# Patient Record
Sex: Male | Born: 1935 | Race: White | Hispanic: No | Marital: Married | Smoking: Never smoker
Health system: Southern US, Community
[De-identification: ages and names within clinical notes are randomized; demographics above are authoritative.]

## PROBLEM LIST (undated history)

## (undated) DIAGNOSIS — K7460 Unspecified cirrhosis of liver: Secondary | ICD-10-CM

## (undated) DIAGNOSIS — I509 Heart failure, unspecified: Secondary | ICD-10-CM

## (undated) DIAGNOSIS — C801 Malignant (primary) neoplasm, unspecified: Secondary | ICD-10-CM

## (undated) DIAGNOSIS — G912 (Idiopathic) normal pressure hydrocephalus: Secondary | ICD-10-CM

## (undated) DIAGNOSIS — I10 Essential (primary) hypertension: Secondary | ICD-10-CM

## (undated) DIAGNOSIS — G4730 Sleep apnea, unspecified: Secondary | ICD-10-CM

## (undated) HISTORY — PX: JOINT REPLACEMENT: SHX530

## (undated) HISTORY — PX: HERNIA REPAIR: SHX51

## (undated) HISTORY — DX: Heart failure, unspecified: I50.9

---

## 2004-01-14 HISTORY — PX: PACEMAKER INSERTION: SHX728

## 2004-01-29 ENCOUNTER — Inpatient Hospital Stay: Payer: Self-pay | Admitting: Unknown Physician Specialty

## 2004-02-27 ENCOUNTER — Encounter: Payer: Self-pay | Admitting: Unknown Physician Specialty

## 2004-03-13 ENCOUNTER — Encounter: Payer: Self-pay | Admitting: Unknown Physician Specialty

## 2005-08-08 ENCOUNTER — Ambulatory Visit: Payer: Self-pay | Admitting: Gastroenterology

## 2005-10-08 ENCOUNTER — Ambulatory Visit: Payer: Self-pay

## 2007-01-22 ENCOUNTER — Ambulatory Visit: Payer: Self-pay | Admitting: Internal Medicine

## 2007-05-26 ENCOUNTER — Inpatient Hospital Stay: Payer: Self-pay | Admitting: Internal Medicine

## 2007-05-26 ENCOUNTER — Other Ambulatory Visit: Payer: Self-pay

## 2008-07-13 ENCOUNTER — Encounter: Payer: Self-pay | Admitting: Internal Medicine

## 2008-08-13 ENCOUNTER — Encounter: Payer: Self-pay | Admitting: Internal Medicine

## 2010-04-01 ENCOUNTER — Ambulatory Visit: Payer: Self-pay | Admitting: Internal Medicine

## 2010-04-16 ENCOUNTER — Inpatient Hospital Stay: Payer: Self-pay | Admitting: Internal Medicine

## 2010-04-24 ENCOUNTER — Encounter: Payer: Self-pay | Admitting: Orthopedic Surgery

## 2010-05-14 ENCOUNTER — Encounter: Payer: Self-pay | Admitting: Orthopedic Surgery

## 2010-06-14 ENCOUNTER — Encounter: Payer: Self-pay | Admitting: Orthopedic Surgery

## 2010-12-12 ENCOUNTER — Ambulatory Visit: Payer: Self-pay | Admitting: Internal Medicine

## 2012-11-29 IMAGING — US US EXTREM LOW VENOUS*R*
1 series · 14 of 24 positions shown · non-contrast
Comparison: none

REASON FOR EXAM: STAT CR [id number] swelling and pain of right leg TKR last
week eval for DVT
COMMENTS:

PROCEDURE:     US  - US DOPPLER LOW EXTR RIGHT  - April 01, 2010  [DATE]
RESULT:     The right femoral and popliteal veins are normally compressible.
The waveform patterns are normal and the color flow images are normal. The
response to the augmentation and Valsalva maneuvers is normal.

[Series 1: us extrem low venous*right* · 14 of 25 slices shown]
[im 1/25]
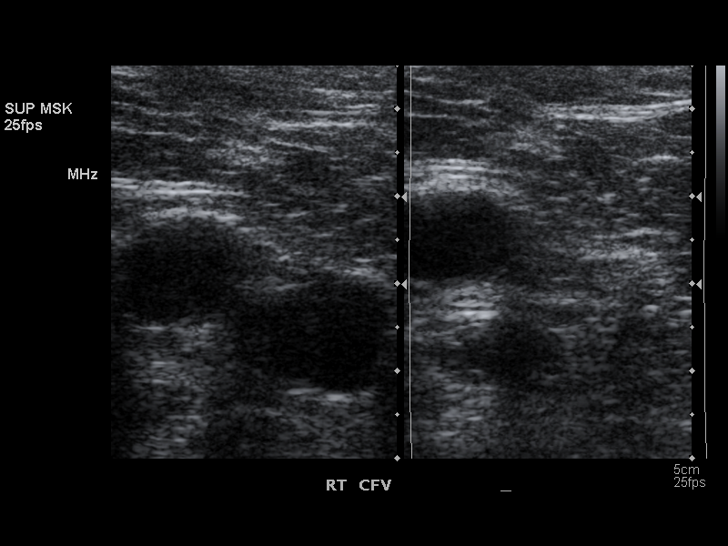
[im 3/25]
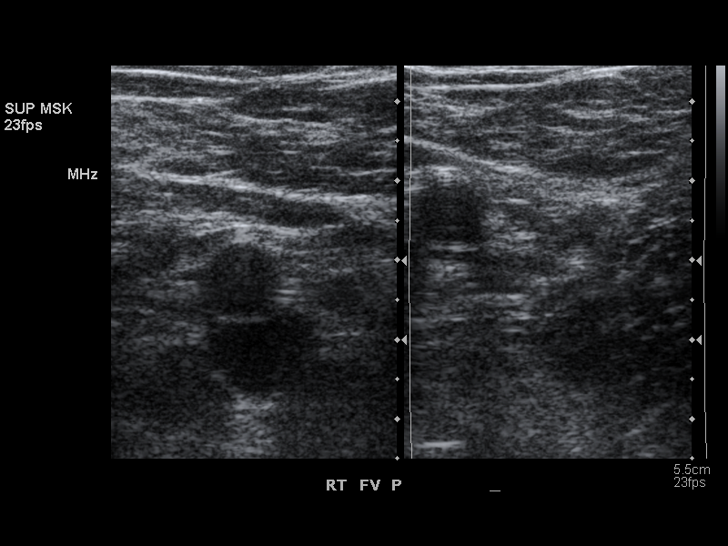
[im 5/25]
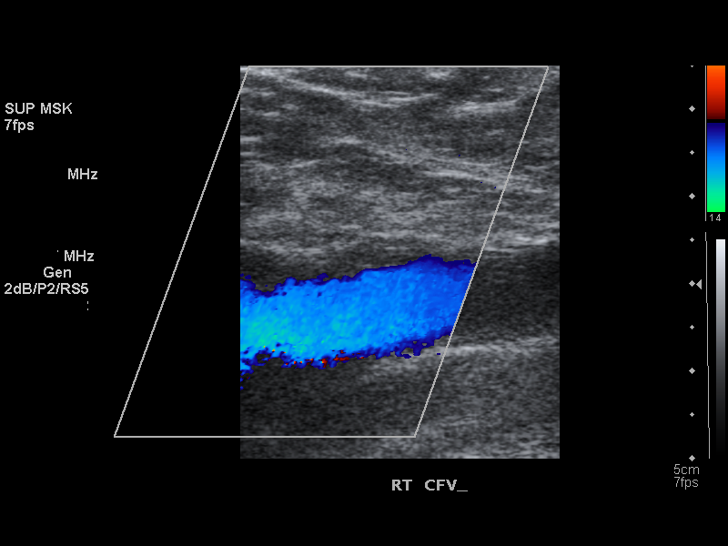
[im 7/25]
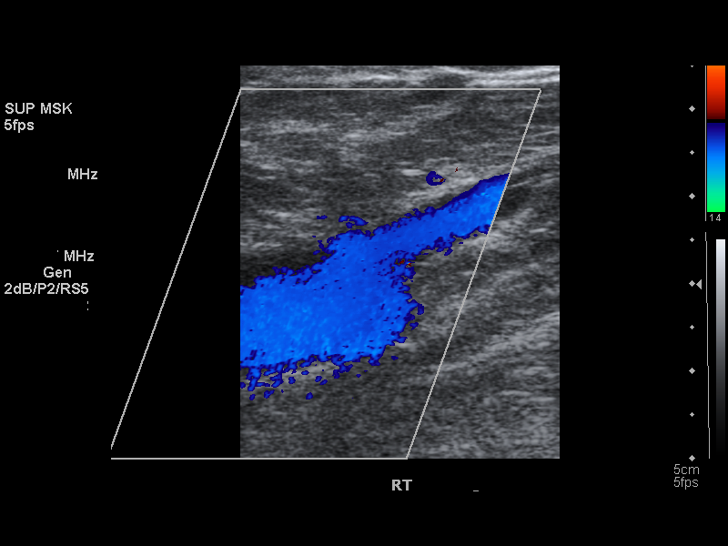
[im 8/25]
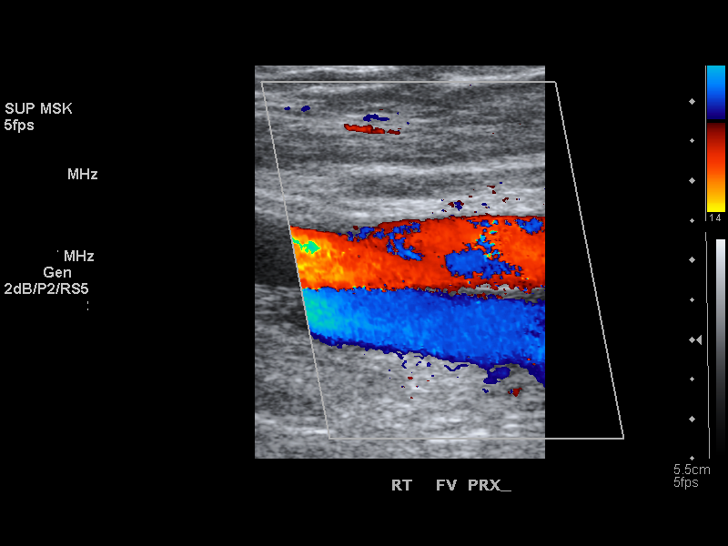
[im 10/25]
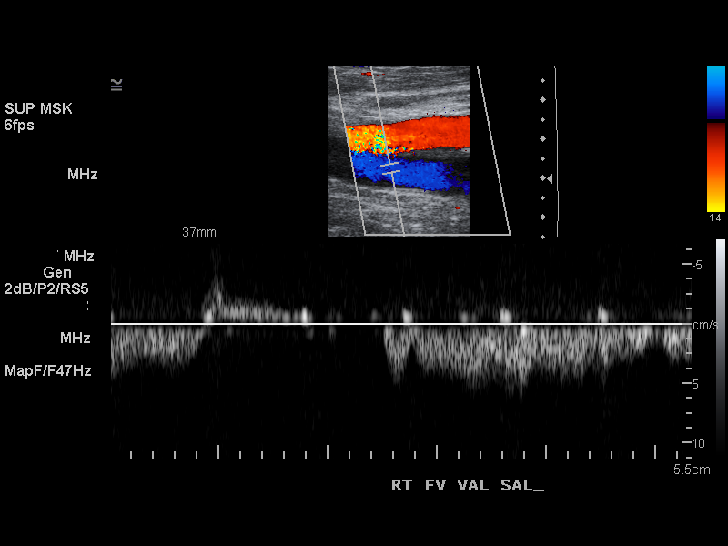
[im 12/25]
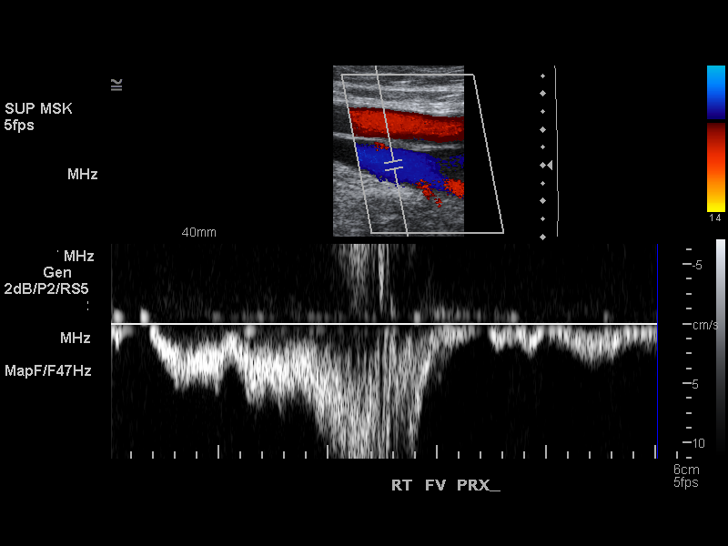
[im 13/25]
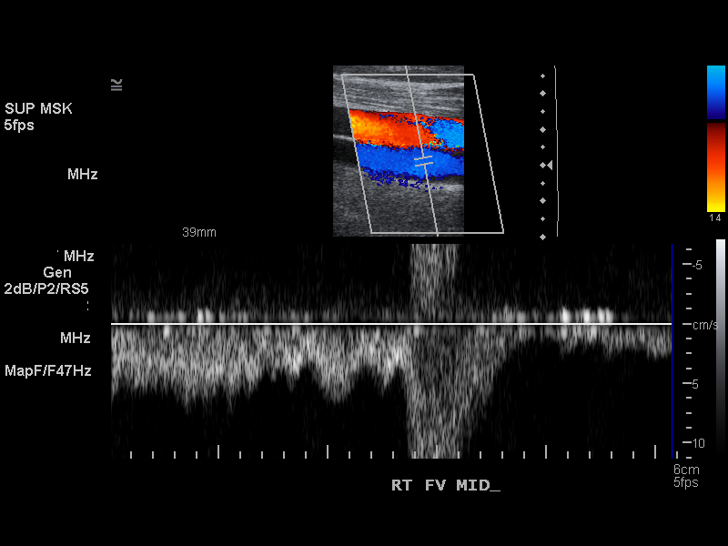
[im 15/25]
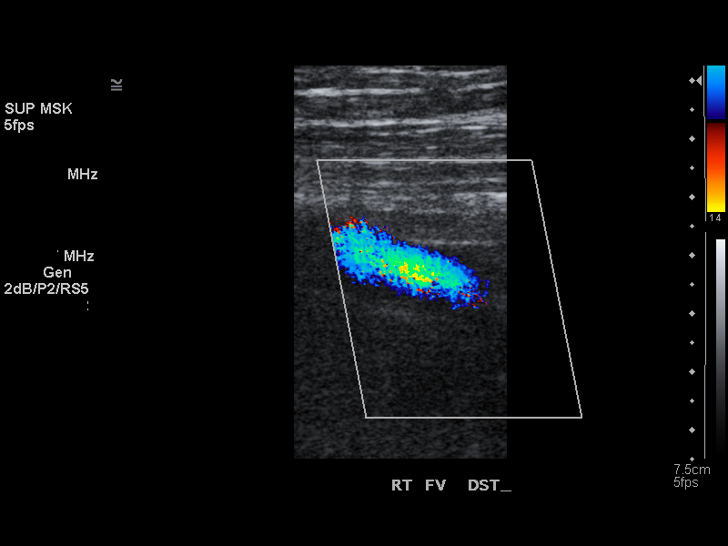
[im 17/25]
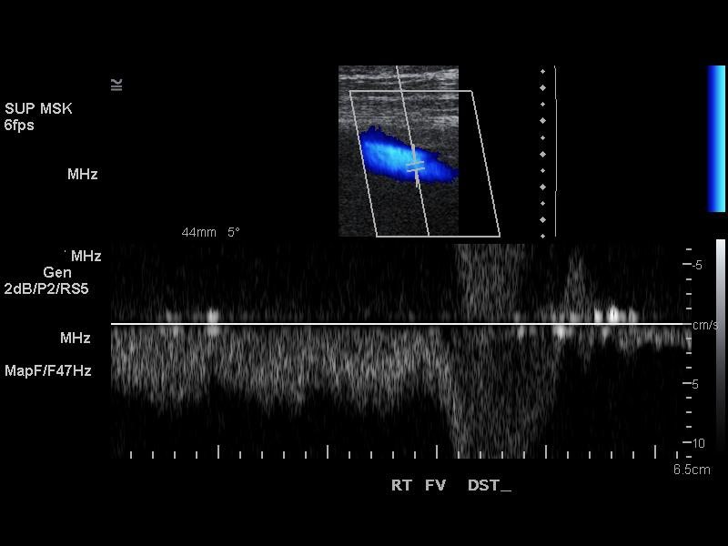
[im 19/25]
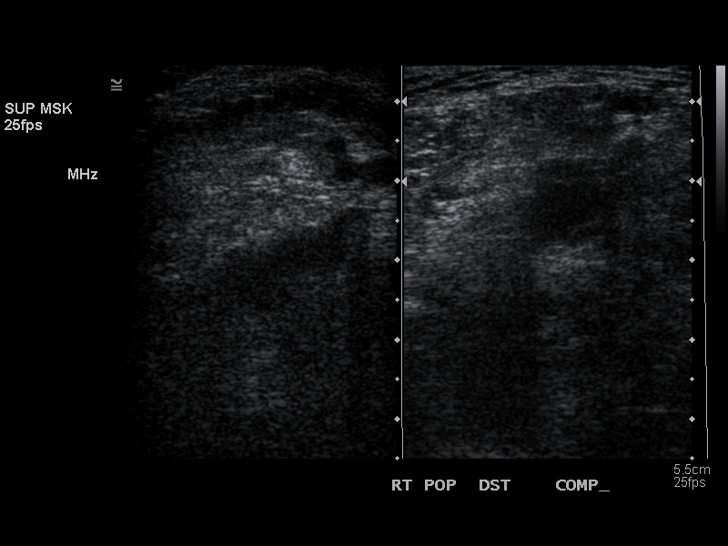
[im 20/25]
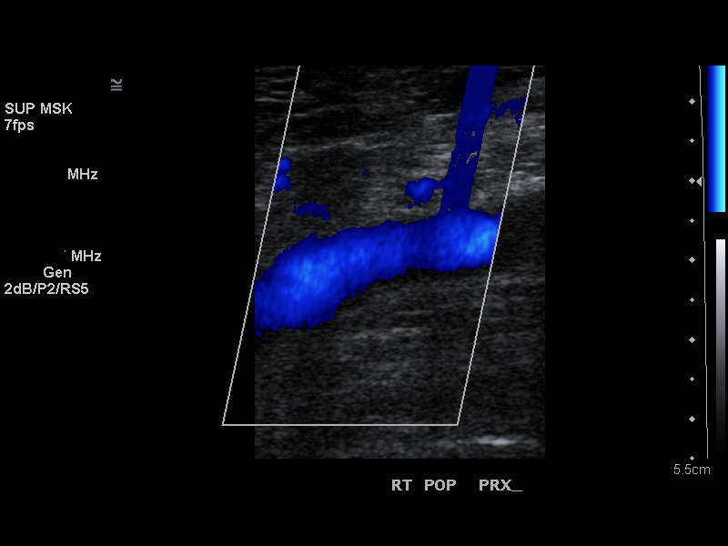
[im 22/25]
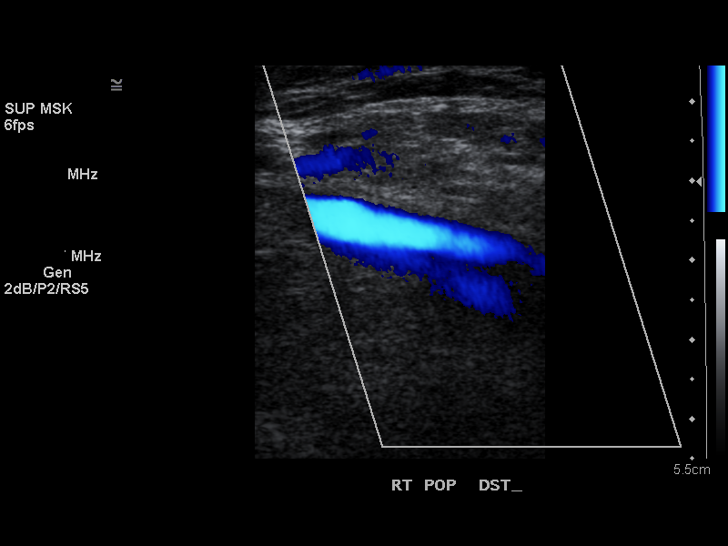
[im 25/25]
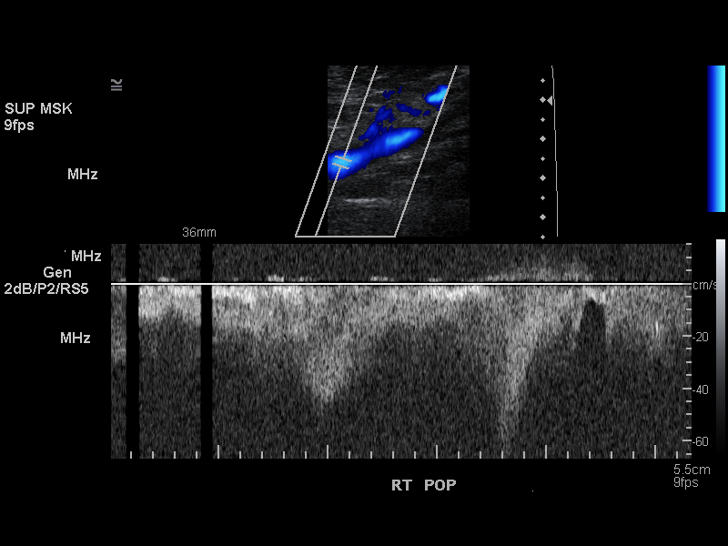

[14 of 24 positions shown; findings below may reference images not displayed]

IMPRESSION: I see no evidence of thrombus within the right femoral or
popliteal veins.

A preliminary report was called to Dr. [REDACTED] at the conclusion of
the study.

## 2012-12-31 ENCOUNTER — Ambulatory Visit: Payer: Self-pay | Admitting: Internal Medicine

## 2013-12-05 ENCOUNTER — Ambulatory Visit: Payer: Self-pay | Admitting: Gastroenterology

## 2014-11-08 ENCOUNTER — Encounter: Payer: Self-pay | Admitting: Physical Therapy

## 2014-11-08 ENCOUNTER — Ambulatory Visit: Payer: Medicare HMO | Attending: Internal Medicine | Admitting: Physical Therapy

## 2014-11-08 DIAGNOSIS — R296 Repeated falls: Secondary | ICD-10-CM | POA: Insufficient documentation

## 2014-11-08 DIAGNOSIS — R531 Weakness: Secondary | ICD-10-CM

## 2014-11-08 DIAGNOSIS — R269 Unspecified abnormalities of gait and mobility: Secondary | ICD-10-CM | POA: Diagnosis not present

## 2014-11-08 DIAGNOSIS — M6281 Muscle weakness (generalized): Secondary | ICD-10-CM | POA: Diagnosis present

## 2014-11-08 NOTE — Patient Instructions (Signed)
SIT TO STAND: No Device   Sit with feet shoulder-width apart, on floor.(Make sure that you are in a chair that won't move like a chair against a wall or couch etc) Lean chest forward, raise hips up from surface. Straighten hips and knees. Weight bear equally on left and right sides. 10___ reps per set, _2__ sets per day, _5__ days per week Place left leg closer to sitting surface.  Copyright  VHI. All rights reserved.  Backward Walking   Copyright  VHI. All rights reserved.  Tandem stance   Stand beside kitchen sink and place one foot in front of the other, lift your hand and try to hold position for 10 sec. Repeat with other foot in front; Repeat 5 reps with each foot in front 5 days a week.Balance: Unilateral  Keep eyes open:   http://orth.exer.us/29   Copyright  VHI. All rights reserved.

## 2014-11-09 NOTE — Therapy (Addendum)
[place] MAIN [facility] SERVICES [address] [place], [place], [address] Phone: [phone]   Fax:  [phone]  Physical Therapy Evaluation  Patient Details  Name: Stephen Roth. MRN: [id number] Date of Birth: 09-16-1925 Referring Provider: Tama High III  Encounter Date: 11/08/2014    Past Medical History  Diagnosis Date  . CHF (congestive heart failure) [facility])     Past Surgical History  Procedure Laterality Date  . Pacemaker insertion  2006    Pace maker, defibulator    There were no vitals filed for this visit.  Visit Diagnosis:  Abnormality of gait - Plan: PT plan of care cert/re-cert  Repeated falls - Plan: PT plan of care cert/re-cert  Decreased strength - Plan: PT plan of care cert/re-cert           Treatment:   Balance training.  Tandem stance 2x 15 seconds each LE  Sit to stand 2x 5   PT provided min-moderate verbal instruction for proper UE use, improve positioning, improve weight shifting to reduce LOB. Patient responded well to instructions to decreased LOB and decreased UE use.                            PT Long Term Goals - 11/09/14 0736    PT LONG TERM GOAL #1   Title Patient will be independent with HEP to improve balance and strength to reduce fall risk by 01/03/15   Time 8   Period Weeks   Status New   PT LONG TERM GOAL #2   Title Patient will improve Berg balance scale to >46 to indicate decreased fall risk at home and in the community by 01/03/15   Time 8   Period Weeks   Status New   PT LONG TERM GOAL #3   Title Patient will improve DGI to >19 to indicate decreased fall risk as well as improved function within the community by 01/03/15   Time 8   Period Weeks   Status New   PT LONG TERM GOAL #4   Title Patient will improve LE strength to at least 4+/5 throughout LE to allow patient to negotiate stairs with decreased difficulty by 01/03/15   Time 8   Period  Weeks   Status New   PT LONG TERM GOAL #5   Title Patient will increase 6 minute walk test by > 300 ft to indicate improve endurance and allow increased ability to access golf course.    Time 8   Period Weeks   Status New                Problem List There are no active problems to display for this patient.  Barrie Folk SPT 12/04/2014   2:51 PM   Royce Macadamia 12/04/2014, 2:51 PM   This entire session was performed under direct supervision and direction of a licensed therapist/therapist assistant . I have personally read, edited and approve of the note as written. Kerman Passey, PT, [place] MAIN [facility] SERVICES [address] [place], [place], [address] Phone: [phone]   Fax:  [phone]  Name: Stephen Roth. MRN: [id number] Date of Birth: [birth year]   Late addendum for g-codes  Kerman Passey, PT, DPT

## 2014-11-13 ENCOUNTER — Encounter: Payer: Self-pay | Admitting: Physical Therapy

## 2014-11-13 ENCOUNTER — Ambulatory Visit: Payer: Medicare HMO | Admitting: Physical Therapy

## 2014-11-13 DIAGNOSIS — R531 Weakness: Secondary | ICD-10-CM

## 2014-11-13 DIAGNOSIS — R269 Unspecified abnormalities of gait and mobility: Secondary | ICD-10-CM

## 2014-11-13 DIAGNOSIS — R296 Repeated falls: Secondary | ICD-10-CM

## 2014-11-13 NOTE — Patient Instructions (Addendum)
   FUNCTIONAL MOBILITY: Marching (Therapy Ball)    Sit on therapy ball. Raise left leg then right to march in place. Alternate. __20_ reps per set, _2__ sets per day, _6__ days per week  Copyright  VHI. All rights reserved.  Knee High    Holding stable object, raise knee to hip level, then lower knee. Repeat with other knee. Repeat ___15_ times. Do ___2_ sessions per day.  http://gt2.exer.us/767   Copyright  VHI. All rights reserved.

## 2014-11-13 NOTE — Therapy (Signed)
[place] MAIN [facility] SERVICES [address] [place], [place], [address] Phone: [phone]   Fax:  [phone]  Physical Therapy Treatment  Patient Details  Name: Stephen Roth. MRN: [id number] Date of Birth: 08-09-1925 Referring Provider: Tama High III  Encounter Date: 11/13/2014      PT End of Session - 11/13/14 1325    Visit Number 2   Number of Visits 17   Date for PT Re-Evaluation 01/03/15   Authorization Type Gcode 2   Authorization Time Period 10    PT Start Time 1315   PT Stop Time 1345   PT Time Calculation (min) 30 min   Equipment Utilized During Treatment Gait belt   Activity Tolerance Patient tolerated treatment well   Behavior During Therapy WFL for tasks assessed/performed      Past Medical History  Diagnosis Date  . CHF (congestive heart failure) [facility])     Past Surgical History  Procedure Laterality Date  . Pacemaker insertion  2006    Pace maker, defibulator    There were no vitals filed for this visit.  Visit Diagnosis:  Abnormality of gait  Repeated falls  Decreased strength      Subjective Assessment - 11/13/14 1320    Subjective Patient arrived late to PT. Reports that he is doing well upon arrival to PT. He states that he was able to complete his home exercises several times and even did them while he was out of town. No new falls reported over the weekend    Pertinent History Bilateral knee replacements, one in 2006, one in 2011. He also states that he has a pacemaker placment in approx 2006.    How long can you sit comfortably? n.a    How long can you stand comfortably? 15 - 20 minutes with use of cane or HHA.    How long can you walk comfortably? a couple of blocks with [facility]    Diagnostic tests no recent imaging    Patient Stated Goals improve balance. play golf.   Currently in Pain? No/denies             Nustep, 3 minutes, level 3 ( unbilled)    6 minute walk test. See above  for results  Gait training for 90 ft with SPC. Constant verbal instruction provided to increase step length and step height. Able to correct, provided that PT continued to provide instruction.   Therex:  Standing marches 2x 15  Seated marches 2x 12   Verbal instruction to increase ROM and maintain ROM throughout exercise. Patient able to correct with consant instruction.                     PT Education - 11/13/14 1326    Education provided Yes   Education Details 6 minute walk, therex   Person(s) Educated Patient   Methods Explanation;Demonstration;Tactile cues;Verbal cues   Comprehension Verbalized understanding;Returned demonstration;Verbal cues required;Tactile cues required             PT Long Term Goals - 11/09/14 0736    PT LONG TERM GOAL #1   Title Patient will be independent with HEP to improve balance and strength to reduce fall risk by 01/03/15   Time 8   Period Weeks   Status New   PT LONG TERM GOAL #2   Title Patient will improve Berg balance scale to >46 to indicate decreased fall risk at home and in the community by  01/03/15   Time 8   Period Weeks   Status New   PT LONG TERM GOAL #3   Title Patient will improve DGI to >19 to indicate decreased fall risk as well as improved function within the community by 01/03/15   Time 8   Period Weeks   Status New   PT LONG TERM GOAL #4   Title Patient will improve LE strength to at least 4+/5 throughout LE to allow patient to negotiate stairs with decreased difficulty by 01/03/15   Time 8   Period Weeks   Status New   PT LONG TERM GOAL #5   Title Patient will increase 6 minute walk test by > 300 ft to indicate improve endurance and allow increased ability to access golf course.    Time 8   Period Weeks   Status New               Plan - 11/13/14 1613    Clinical Impression Statement Patient instructed in 6 minute walk to assess function within the community as well as balance exercise. Pt  demonstrated decreased community ambulation based on distance ambulated with 6 minute walk. Increased gait deviations also noted throughout 6 minute walk with decreased step height, decreased step length and flexed posture. Patient was able to correct gait deviations with constant cueing from PT with gait training following 6 minute walk. Instruction provided for exercises to increase hip flexion in standing and sitting to increase foot clearance with gait. Continued PT is recommended improve gait, increase balance and increase strength to allow improved safety within the community.   Pt will benefit from skilled therapeutic intervention in order to improve on the following deficits Abnormal gait;Decreased activity tolerance;Decreased balance;Decreased coordination;Decreased endurance;Decreased knowledge of use of DME;Decreased mobility;Decreased range of motion;Decreased safety awareness;Decreased strength;Difficulty walking;Impaired flexibility;Improper body mechanics;Postural dysfunction   Rehab Potential Good   Clinical Impairments Affecting Rehab Potential Positive: relatively high functioning, minimal comorbidities. Negative: decreased perception of deficits.    PT Frequency 2x / week   PT Duration 8 weeks   PT Treatment/Interventions ADLs/Self Care Home Management;Cryotherapy;Moist Heat;DME Instruction;Gait training;Stair training;Functional mobility training;Therapeutic activities;Therapeutic exercise;Balance training;Neuromuscular re-education;Patient/family education;Manual techniques;Passive range of motion   PT Next Visit Plan dynamic/static balance, gait training.    PT Home Exercise Plan see patient instructions.    Consulted and Agree with Plan of Care Patient;Family member/caregiver        Problem List There are no active problems to display for this patient.  Barrie Folk SPT 11/14/2014   10:21 AM  This entire session was performed under direct supervision and direction of a  licensed therapist . I have personally read, edited and approve of the note as written.  Hopkins,Margaret PT, DPT 11/14/2014, 10:21 AM  [place] MAIN [facility] SERVICES [address] [place], [place], [address] Phone: [phone]   Fax:  [phone]  Name: Stephen Roth. MRN: [id number] Date of Birth: July 11, 1925

## 2014-11-15 ENCOUNTER — Ambulatory Visit: Payer: Medicare HMO | Admitting: Physical Therapy

## 2014-11-17 ENCOUNTER — Ambulatory Visit: Payer: Medicare HMO | Attending: Internal Medicine | Admitting: Physical Therapy

## 2014-11-17 ENCOUNTER — Encounter: Payer: Self-pay | Admitting: Physical Therapy

## 2014-11-17 DIAGNOSIS — M6281 Muscle weakness (generalized): Secondary | ICD-10-CM | POA: Insufficient documentation

## 2014-11-17 DIAGNOSIS — R269 Unspecified abnormalities of gait and mobility: Secondary | ICD-10-CM | POA: Insufficient documentation

## 2014-11-17 DIAGNOSIS — R531 Weakness: Secondary | ICD-10-CM

## 2014-11-17 DIAGNOSIS — R296 Repeated falls: Secondary | ICD-10-CM | POA: Insufficient documentation

## 2014-11-17 NOTE — Patient Instructions (Addendum)
   Knee High    Holding stable object (counter), raise knee to hip level, then lower knee. Repeat with other knee. Repeat _15___ times. Do _2___ sessions per day.  http://gt2.exer.us/767   Copyright  VHI. All rights reserved.   Weight shift     In lunge stance, Shift weight 100% onto back foot maintaining head, shoulders, torso, and pelvis facing forward. Then return weight to front foot. HOLD ONTO COUNTER  Repeat forward and backward weight shift _15_ times each side.  Copyright  VHI. All rights reserved.

## 2014-11-17 NOTE — Therapy (Signed)
[place] MAIN [facility] SERVICES [address] [place], [place], [address] Phone: [phone]   Fax:  [phone]  Physical Therapy Treatment  Patient Details  Name: Stephen Roth. MRN: [id number] Date of Birth: March 29, 1925 Referring Provider: Tama High III  Encounter Date: 11/17/2014      PT End of Session - 11/17/14 1207    Visit Number 3   Number of Visits 17   Date for PT Re-Evaluation 01/03/15   Authorization Type Gcode 3   Authorization Time Period 10    PT Start Time 1015   PT Stop Time 1100   PT Time Calculation (min) 45 min   Equipment Utilized During Treatment Gait belt   Activity Tolerance Patient tolerated treatment well   Behavior During Therapy WFL for tasks assessed/performed      Past Medical History  Diagnosis Date  . CHF (congestive heart failure) [facility])     Past Surgical History  Procedure Laterality Date  . Pacemaker insertion  2006    Pace maker, defibulator    There were no vitals filed for this visit.  Visit Diagnosis:  Abnormality of gait  Repeated falls  Decreased strength      Subjective Assessment - 11/17/14 1023    Subjective Patient states that he is doing well upon arrival to PT. He reports that he is still using his [facility] for most mobility in the house and in the community. Also states that his exercises are going well, but he is having a little trouble with tandem stance.    Pertinent History Bilateral knee replacements, one in 2006, one in 2011. He also states that he has a pacemaker placment in approx 2006.    How long can you sit comfortably? n.a    How long can you stand comfortably? 15 - 20 minutes with use of cane or HHA.    How long can you walk comfortably? a couple of blocks with [facility]    Diagnostic tests no recent imaging    Patient Stated Goals improve balance. play golf.   Currently in Pain? No/denies             nustep level 4, 3 minutes (unbilled)   Stepping over  and back cane x 15 each LE  Lunge stance with weight shift 2x 1 minute each direction Standing marches 3x 15 each LE  Foot taps against red band to 6 inch step x 12 each LE Lunge stance 3x 20 second hold each LE   PT provided moderate verbal and tactile instruction to improve positioning, increase weight shift, decrease UE support, and improve ROM with marching exercises. Patient responded moderately to instruction , but had difficulty with improved anterior posterior weight shifting in lunge stance.   Stepping in ladder 1 foot in each rung 2 HHA on //bars x 5 laps  Marching through ladder 1 foot in each rung 2 HHA on //bars x 4 laps  Gait within therapy gym 45ft x 3 with SPC and CGA  PT instructed patient to increase step height, increase step length, and improve weight shifting with gait. Patient was able to make corrects to gait with constant instruction from PT.  PT also required to provide min instruction for proper AD management use.   Patient was noted to be impulsive and need repeated instruction to maintain exercise form throughout PT treatment session.  PT Education - 11/17/14 1206    Education provided Yes   Education Details balance training. gait training.    Person(s) Educated Patient   Methods Explanation;Demonstration;Tactile cues;Verbal cues   Comprehension Verbalized understanding;Returned demonstration;Tactile cues required;Verbal cues required             PT Long Term Goals - 11/09/14 0736    PT LONG TERM GOAL #1   Title Patient will be independent with HEP to improve balance and strength to reduce fall risk by 01/03/15   Time 8   Period Weeks   Status New   PT LONG TERM GOAL #2   Title Patient will improve Berg balance scale to >46 to indicate decreased fall risk at home and in the community by 01/03/15   Time 8   Period Weeks   Status New   PT LONG TERM GOAL #3   Title Patient will improve DGI to >19 to indicate  decreased fall risk as well as improved function within the community by 01/03/15   Time 8   Period Weeks   Status New   PT LONG TERM GOAL #4   Title Patient will improve LE strength to at least 4+/5 throughout LE to allow patient to negotiate stairs with decreased difficulty by 01/03/15   Time 8   Period Weeks   Status New   PT LONG TERM GOAL #5   Title Patient will increase 6 minute walk test by > 300 ft to indicate improve endurance and allow increased ability to access golf course.    Time 8   Period Weeks   Status New               Plan - 11/17/14 1207    Clinical Impression Statement Patient instructed in balance and gait training on this day. Patient continues to demonstrate decreased step height and step length with BLE. Gait and dynamic balance training were performed to deficits noted. Patient was able to demonstrate increased step length and step height with gait following balance training and with constant cueing from PT. PT also provided education on the benefit of increased stability with RW compared to [facility]. Continued skilled PT is recommended to improve gait mechanics, improve balance, and increase strength to improve safety within the community.   Pt will benefit from skilled therapeutic intervention in order to improve on the following deficits Abnormal gait;Decreased activity tolerance;Decreased balance;Decreased coordination;Decreased endurance;Decreased knowledge of use of DME;Decreased mobility;Decreased range of motion;Decreased safety awareness;Decreased strength;Difficulty walking;Impaired flexibility;Improper body mechanics;Postural dysfunction   Rehab Potential Good   Clinical Impairments Affecting Rehab Potential Positive: relatively high functioning, minimal comorbidities. Negative: decreased perception of deficits.    PT Frequency 2x / week   PT Duration 8 weeks   PT Treatment/Interventions ADLs/Self Care Home Management;Cryotherapy;Moist Heat;DME  Instruction;Gait training;Stair training;Functional mobility training;Therapeutic activities;Therapeutic exercise;Balance training;Neuromuscular re-education;Patient/family education;Manual techniques;Passive range of motion   PT Next Visit Plan dynamic/static balance,   PT Home Exercise Plan see patient instructions.    Consulted and Agree with Plan of Care Patient;Family member/caregiver        Problem List There are no active problems to display for this patient.  Barrie Folk SPT 11/17/2014   12:20 PM  This entire session was performed under direct supervision and direction of a licensed therapist . I have personally read, edited and approve of the note as written.  Alessandra Grout, PT, DPT, [phone] 11/20/2014 9:00 AM   [place] MAIN [facility] SERVICES [address]  [place], [place], [address] Phone: [phone]   Fax:  [phone]  Name: Stephen Roth. MRN: [id number] Date of Birth: 08-Mar-1925

## 2014-11-21 ENCOUNTER — Encounter: Payer: Self-pay | Admitting: Physical Therapy

## 2014-11-21 ENCOUNTER — Ambulatory Visit: Payer: Medicare HMO | Admitting: Physical Therapy

## 2014-11-21 DIAGNOSIS — R269 Unspecified abnormalities of gait and mobility: Secondary | ICD-10-CM

## 2014-11-21 DIAGNOSIS — R296 Repeated falls: Secondary | ICD-10-CM

## 2014-11-21 DIAGNOSIS — R531 Weakness: Secondary | ICD-10-CM

## 2014-11-22 NOTE — Therapy (Signed)
[place] MAIN [facility] SERVICES [address] [place], [place], [address] Phone: [phone]   Fax:  [phone]  Physical Therapy Treatment  Patient Details  Name: Stephen Roth. MRN: [id number] Date of Birth: 1926-01-03 Referring Provider: Tama High III  Encounter Date: 11/21/2014      PT End of Session - 11/21/14 1610    Visit Number 4   Number of Visits 17   Date for PT Re-Evaluation 01/03/15   Authorization Type Gcode 4   Authorization Time Period 10    PT Start Time 1545   PT Stop Time 1630   PT Time Calculation (min) 45 min   Equipment Utilized During Treatment Gait belt   Activity Tolerance Patient tolerated treatment well   Behavior During Therapy WFL for tasks assessed/performed      Past Medical History  Diagnosis Date  . CHF (congestive heart failure) [facility])     Past Surgical History  Procedure Laterality Date  . Pacemaker insertion  2006    Pace maker, defibulator    There were no vitals filed for this visit.  Visit Diagnosis:  Abnormality of gait  Repeated falls  Decreased strength      Subjective Assessment - 11/21/14 1552    Subjective Patient reports that he is doing well upon arrival to PT. He states that he has been inconsistent with his home exercises, and that he has continued to use his [facility] for most commnuity ambulation.    Pertinent History Bilateral knee replacements, one in 2006, one in 2011. He also states that he has a pacemaker placment in approx 2006.    How long can you sit comfortably? n.a    How long can you stand comfortably? 15 - 20 minutes with use of cane or HHA.    How long can you walk comfortably? a couple of blocks with [facility]    Diagnostic tests no recent imaging    Patient Stated Goals improve balance. play golf.   Currently in Pain? No/denies         Treatment:   Nustep Level 5, 3 minutes ( unbilled)   Supine therex.  Bridges 2x 12  SLR 2x 10 BLE  Hip abduction  red tband  2x10 Marches 2x 10 BLE   Balance training:  Forward step and back in 4 sqaure box x 12 BLE Side step and back in 4 square box. X 12 BLE Standing on airex beam 2x 40 seconds ( up to 12 seconds without HHA.)  Toe taps on 6 inch step x 12 each LE with 1 HHA  Step ups to 6 inch step x 10 each LE with 2 HHA   PT was required to provided CGA with all balance exercises to improve patient safety. Moderate to constant verbal and tactile instruction were provided to help patient maintain proper exercise technique including improved ROM, proper speed of exercises, decreased compensation and accessory movements of the LE and trunk with strengthening therex. Constant verbal instruction also provided with balance training to help patient remain on task, decrease UE support and improve step height and step length with dynamic balance training. Patient responded moderately to instruction when instruction was given, but minimal carry over was seen when instruction was reduced.                         PT Education - 11/21/14 1611    Education provided Yes   Education Details LE  strengthening, balance training.    Person(s) Educated Patient   Methods Explanation;Demonstration;Tactile cues;Verbal cues   Comprehension Verbalized understanding;Returned demonstration;Verbal cues required;Tactile cues required             PT Long Term Goals - 11/09/14 0736    PT LONG TERM GOAL #1   Title Patient will be independent with HEP to improve balance and strength to reduce fall risk by 01/03/15   Time 8   Period Weeks   Status New   PT LONG TERM GOAL #2   Title Patient will improve Berg balance scale to >46 to indicate decreased fall risk at home and in the community by 01/03/15   Time 8   Period Weeks   Status New   PT LONG TERM GOAL #3   Title Patient will improve DGI to >19 to indicate decreased fall risk as well as improved function within the community by 01/03/15   Time 8    Period Weeks   Status New   PT LONG TERM GOAL #4   Title Patient will improve LE strength to at least 4+/5 throughout LE to allow patient to negotiate stairs with decreased difficulty by 01/03/15   Time 8   Period Weeks   Status New   PT LONG TERM GOAL #5   Title Patient will increase 6 minute walk test by > 300 ft to indicate improve endurance and allow increased ability to access golf course.    Time 8   Period Weeks   Status New               Plan - 11/22/14 0735    Clinical Impression Statement Patient instructed in LE strengthening as well as static and dynamic balance training. Patient required CGA with all balance training to improve patient safety as well as constant instruction for improve exercise technique including improved ROM, proper speed of exercises and improve step height and length. Patient continues to demonstrate difficulty with increasing step height and length with balance training, unless constant instruction is provided by PT. Continued skilled PT is recommended to improve LE strength, improve balance and normalize gait pattern to allow improved safety with gait in the community.     Pt will benefit from skilled therapeutic intervention in order to improve on the following deficits Abnormal gait;Decreased activity tolerance;Decreased balance;Decreased coordination;Decreased endurance;Decreased knowledge of use of DME;Decreased mobility;Decreased range of motion;Decreased safety awareness;Decreased strength;Difficulty walking;Impaired flexibility;Improper body mechanics;Postural dysfunction   Rehab Potential Good   Clinical Impairments Affecting Rehab Potential Positive: relatively high functioning, minimal comorbidities. Negative: decreased perception of deficits.    PT Frequency 2x / week   PT Duration 8 weeks   PT Treatment/Interventions ADLs/Self Care Home Management;Cryotherapy;Moist Heat;DME Instruction;Gait training;Stair training;Functional mobility  training;Therapeutic activities;Therapeutic exercise;Balance training;Neuromuscular re-education;Patient/family education;Manual techniques;Passive range of motion   PT Next Visit Plan gait training. balance training.    PT Home Exercise Plan continue as given    Consulted and Agree with Plan of Care Patient;Family member/caregiver        Problem List There are no active problems to display for this patient.  Barrie Folk SPT 11/22/2014   9:33 AM  This entire session was performed under direct supervision and direction of a licensed therapist . I have personally read, edited and approve of the note as written.   Hopkins,Margaret PT, DPT 11/22/2014, 9:33 AM  [place] MAIN [facility] SERVICES [address] [place], [place], [address] Phone: [phone]   Fax:  [phone]  Name: Linton Stolp. MRN: [id number] Date of Birth: 1925/08/28

## 2014-11-27 ENCOUNTER — Ambulatory Visit: Payer: Medicare HMO | Admitting: Physical Therapy

## 2014-11-27 ENCOUNTER — Encounter: Payer: Self-pay | Admitting: Physical Therapy

## 2014-11-27 DIAGNOSIS — R269 Unspecified abnormalities of gait and mobility: Secondary | ICD-10-CM | POA: Diagnosis not present

## 2014-11-27 DIAGNOSIS — R296 Repeated falls: Secondary | ICD-10-CM

## 2014-11-27 DIAGNOSIS — R531 Weakness: Secondary | ICD-10-CM

## 2014-11-27 NOTE — Patient Instructions (Addendum)
   EXTENSION: Standing - Resistance Band (Active)    Stand, both feet flat. Against  resistance band, draw right leg behind body as far as possible. Complete __2_ sets of _12__ repetitions. Perform __2 sessions per day.  http://gtsc.exer.us/82   Copyright  VHI. All rights reserved.  ABDUCTION: Standing - Unstable: Resistance Band (Active)    Stand on firm surface , feet flat. Against resistance band, lift right leg out to side. Complete __2_ sets of __12_ repetitions. Perform _2__ sessions per day.  Copyright  VHI. All rights reserved.  AMBULATION: Walk Backward    Walk backward. Take large steps, do not drag feet. __12_ reps per set, _2__ sets per day, _5__ days per week Use assistive device. Walk to tempo of metronome or music.  Copyright  VHI. All rights reserved.

## 2014-11-27 NOTE — Therapy (Signed)
[place] MAIN [facility] SERVICES [address] [place], [place], [address] Phone: [phone]   Fax:  [phone]  Physical Therapy Treatment  Patient Details  Name: Stephen Roth. MRN: [id number] Date of Birth: 06/20/1925 Referring Provider: Tama High III  Encounter Date: 11/27/2014      PT End of Session - 11/27/14 1435    Visit Number 5   Number of Visits 17   Date for PT Re-Evaluation 01/03/15   Authorization Type Gcode 5   Authorization Time Period 10    PT Start Time 1330   PT Stop Time 1430   PT Time Calculation (min) 60 min   Equipment Utilized During Treatment Gait belt   Activity Tolerance Patient tolerated treatment well   Behavior During Therapy WFL for tasks assessed/performed      Past Medical History  Diagnosis Date  . CHF (congestive heart failure) [facility])     Past Surgical History  Procedure Laterality Date  . Pacemaker insertion  2006    Pace maker, defibulator    There were no vitals filed for this visit.  Visit Diagnosis:  Repeated falls  Decreased strength  Abnormality of gait      Subjective Assessment - 11/27/14 1336    Subjective Patient reports that she is doing well upon arrival to PT. He states that he had a good weekend and reports no new falls. He states that he has been consistent with his exercises doing at least two exercises once a day.    Pertinent History Bilateral knee replacements, one in 2006, one in 2011. He also states that he has a pacemaker placment in approx 2006.    How long can you sit comfortably? n.a    How long can you stand comfortably? 15 - 20 minutes with use of cane or HHA.    How long can you walk comfortably? a couple of blocks with [facility]    Diagnostic tests no recent imaging    Patient Stated Goals improve balance. play golf.   Currently in Pain? No/denies        Treatment:   Nustep level 4, 55minutes ( unbilled)   Quantum leg press 90#, 2x 15  Calf  raises 2x 15  Standing marches against red tband x 15 BLE  Standing hip abduction red tband x 15 BLE  Standing hip flexion red tband x 15 BLE   Toe taps on 6 inch step 1 HHA x 15 BLE  Step up and over 4 inch step 1 HHA x 10 BLE Step up and over 4 inch step no HHA, x 8 BLE  Lunge stance weight shift forward and back, 2 HHA 2x1 minute BLE; 1 HHA, 2x 53minute BLE; no HHA 2x 45 seconds BLE   Forward and backward gait in // bars no HHA x 12 laps   PT provided GCA for all standing therex and balance training to improve patient safety. Constant verbal instruction required with lunge stance exercises to improve exercise positioning and increase COM(center of mass) movement to enhance weight shift; patient demonstrated difficulty with weight shift without HHA and also demonstrated difficulty weight shifting onto the L LE with SLS tasks. Patient also required moderate verbal and visual instruction for improved L LE hip extension with retro gait and standing hip abduction therex; patient demonstrated moderate response to instruction.  PT Education - 11/27/14 1434    Education provided Yes   Education Details LE strengthening, balance training. HEP advanced.    Person(s) Educated Patient   Methods Explanation;Demonstration;Tactile cues;Verbal cues;Handout   Comprehension Verbalized understanding;Returned demonstration;Verbal cues required;Tactile cues required             PT Long Term Goals - 11/09/14 0736    PT LONG TERM GOAL #1   Title Patient will be independent with HEP to improve balance and strength to reduce fall risk by 01/03/15   Time 8   Period Weeks   Status New   PT LONG TERM GOAL #2   Title Patient will improve Berg balance scale to >46 to indicate decreased fall risk at home and in the community by 01/03/15   Time 8   Period Weeks   Status New   PT LONG TERM GOAL #3   Title Patient will improve DGI to >19 to indicate decreased  fall risk as well as improved function within the community by 01/03/15   Time 8   Period Weeks   Status New   PT LONG TERM GOAL #4   Title Patient will improve LE strength to at least 4+/5 throughout LE to allow patient to negotiate stairs with decreased difficulty by 01/03/15   Time 8   Period Weeks   Status New   PT LONG TERM GOAL #5   Title Patient will increase 6 minute walk test by > 300 ft to indicate improve endurance and allow increased ability to access golf course.    Time 8   Period Weeks   Status New               Plan - 11/27/14 1435    Clinical Impression Statement Patient instructed in LE strengthening and dynamic balance training. Patient continues to demonstrate shuffling gait pattern, but is able to correct with cueing from PT to improve step length. Patient demonstrated difficulty with L hip extension therex and difficulty with L hip extension with retro gait. Constant verbal and tactile instruction was also required to improve weight shift with all dynamic balance exercises, especially in lunge stance with the L LE in front. Continued skilled PT is recommended to improve gait, balance and strength to improve safety with gait at home and in the community.   Pt will benefit from skilled therapeutic intervention in order to improve on the following deficits Abnormal gait;Decreased activity tolerance;Decreased balance;Decreased coordination;Decreased endurance;Decreased knowledge of use of DME;Decreased mobility;Decreased range of motion;Decreased safety awareness;Decreased strength;Difficulty walking;Impaired flexibility;Improper body mechanics;Postural dysfunction   Rehab Potential Good   Clinical Impairments Affecting Rehab Potential Positive: relatively high functioning, minimal comorbidities. Negative: decreased perception of deficits.    PT Frequency 2x / week   PT Duration 8 weeks   PT Treatment/Interventions ADLs/Self Care Home Management;Cryotherapy;Moist  Heat;DME Instruction;Gait training;Stair training;Functional mobility training;Therapeutic activities;Therapeutic exercise;Balance training;Neuromuscular re-education;Patient/family education;Manual techniques;Passive range of motion   PT Next Visit Plan dynamic balance training.    PT Home Exercise Plan advanced. see patient instrucitons.    Consulted and Agree with Plan of Care Patient;Family member/caregiver        Problem List There are no active problems to display for this patient.  Barrie Folk SPT 11/27/2014   3:26 PM  This entire session was performed under direct supervision and direction of a licensed therapist. I have personally read, edited and approve of the note as written.  Hopkins,Margaret PT, DPT 11/27/2014, 3:26 PM  [place]  [place], [place], [address] Phone: [phone]   Fax:  [phone]  Name: Stephen Roth. MRN: [id number] Date of Birth: October 08, 1925

## 2014-11-29 ENCOUNTER — Ambulatory Visit: Payer: Medicare HMO | Admitting: Physical Therapy

## 2014-11-29 ENCOUNTER — Encounter: Payer: Self-pay | Admitting: Physical Therapy

## 2014-11-29 DIAGNOSIS — R296 Repeated falls: Secondary | ICD-10-CM

## 2014-11-29 DIAGNOSIS — R531 Weakness: Secondary | ICD-10-CM

## 2014-11-29 DIAGNOSIS — R269 Unspecified abnormalities of gait and mobility: Secondary | ICD-10-CM

## 2014-11-29 NOTE — Therapy (Signed)
[place] MAIN [facility] SERVICES [address] [place], [place], [address] Phone: [phone]   Fax:  [phone]  Physical Therapy Treatment  Patient Details  Name: Stephen Roth. MRN: [id number] Date of Birth: March 07, 1925 Referring Provider: Tama High III  Encounter Date: 11/29/2014      PT End of Session - 11/29/14 1339    Visit Number 6   Number of Visits 17   Date for PT Re-Evaluation 01/03/15   Authorization Type Gcode 6   Authorization Time Period 10    PT Start Time 1309   PT Stop Time 1353   PT Time Calculation (min) 44 min   Equipment Utilized During Treatment Gait belt   Activity Tolerance Patient tolerated treatment well   Behavior During Therapy WFL for tasks assessed/performed      Past Medical History  Diagnosis Date  . CHF (congestive heart failure) [facility])     Past Surgical History  Procedure Laterality Date  . Pacemaker insertion  2006    Pace maker, defibulator    There were no vitals filed for this visit.  Visit Diagnosis:  Repeated falls  Decreased strength  Abnormality of gait      Subjective Assessment - 11/29/14 1330    Subjective Patient reports that he is doing well upon apon arrival to PT. He states that his wife has reported noticing an improvement in his walking and balance around the house .    Pertinent History Bilateral knee replacements, one in 2006, one in 2011. He also states that he has a pacemaker placment in approx 2006.    How long can you sit comfortably? n.a    How long can you stand comfortably? 15 - 20 minutes with use of cane or HHA.    How long can you walk comfortably? [facility] blocks with [facility]    Diagnostic tests no recent imaging    Patient Stated Goals improve balance. play golf.   Currently in Pain? No/denies       Treatment   Therex : Quantum leg press 75# 2x 12 Step up to 6 inch box. X 12 BLE, 1 HHA  PT provided mod verbal and visual instruction for  improved weight shift with step up, decreased UE support, increased ROM with leg press, and improve control with eccentric movements. Patient demonstrated improved control of movements following instruction.   Balance training.  Weight shift in lunge stance 2x 45 seconds each LE  Forward stepping and back in 4 square box, x 15  Side stepping and back in 4 square box, x 15  Patient utilized [facility] for all stepping and weight shifting balance training.   Toe taps on 5 inch box, no UE support 2 x10 BLE    Standing on Airex 2x 2 minutes. occasional UE Support Standing on airex, 1 minute with UE movements. Occasional UE support.   PT Provided moderate to constant verbal, visual, and tactile instruction for all balance training activities to improve weight shifting, decreased use of UE support, increased use of hip and ankle strategies to prevent posterior and lateral LOB. Patient responded well to verbal instruction but was noted to only make consistent postural adjustment on airex with constant instruction. Patient was able to perform weight shifting with improved success compared to previous treatment but still needed cues to increase backward weight shift.  PT also provided education on the benefit of use of RW for ambulation within the community to improve safety, considering  that the patient continues to demonstrate difficult with weight shifting and SLS tasks.                          PT Education - 11/29/14 1340    Education provided Yes   Education Details LE strengthening, balance training.    Person(s) Educated Patient   Methods Explanation;Demonstration;Tactile cues;Verbal cues   Comprehension Verbalized understanding;Returned demonstration;Verbal cues required;Tactile cues required             PT Long Term Goals - 11/09/14 0736    PT LONG TERM GOAL #1   Title Patient will be independent with HEP to improve balance and strength to reduce fall risk by 01/03/15    Time 8   Period Weeks   Status New   PT LONG TERM GOAL #2   Title Patient will improve Berg balance scale to >46 to indicate decreased fall risk at home and in the community by 01/03/15   Time 8   Period Weeks   Status New   PT LONG TERM GOAL #3   Title Patient will improve DGI to >19 to indicate decreased fall risk as well as improved function within the community by 01/03/15   Time 8   Period Weeks   Status New   PT LONG TERM GOAL #4   Title Patient will improve LE strength to at least 4+/5 throughout LE to allow patient to negotiate stairs with decreased difficulty by 01/03/15   Time 8   Period Weeks   Status New   PT LONG TERM GOAL #5   Title Patient will increase 6 minute walk test by > 300 ft to indicate improve endurance and allow increased ability to access golf course.    Time 8   Period Weeks   Status New               Plan - 11/29/14 1511    Clinical Impression Statement Patient instructed in LE strengthening as well as balance training on this day. Patient demonstrated mildly improved gait pattern with all ambulation around therapy gym with improved step height and foot clearance. Patient was able to perform weight shifting exercises in lunge stance with improved success compared to last PT treatment. PT continues to provided encouragement for RW use in the community, due to continued balance problems. continued skilled PT is recommended to improve balance, increase LE strength, and improve gait to allow increase safety at home and in the community.    Pt will benefit from skilled therapeutic intervention in order to improve on the following deficits Abnormal gait;Decreased activity tolerance;Decreased balance;Decreased coordination;Decreased endurance;Decreased knowledge of use of DME;Decreased mobility;Decreased range of motion;Decreased safety awareness;Decreased strength;Difficulty walking;Impaired flexibility;Improper body mechanics;Postural dysfunction   Rehab  Potential Good   Clinical Impairments Affecting Rehab Potential Positive: relatively high functioning, minimal comorbidities. Negative: decreased perception of deficits.    PT Frequency 2x / week   PT Duration 8 weeks   PT Treatment/Interventions ADLs/Self Care Home Management;Cryotherapy;Moist Heat;DME Instruction;Gait training;Stair training;Functional mobility training;Therapeutic activities;Therapeutic exercise;Balance training;Neuromuscular re-education;Patient/family education;Manual techniques;Passive range of motion   PT Next Visit Plan dynamic balance training. airex balance training.    PT Home Exercise Plan continue as given    Consulted and Agree with Plan of Care Patient;Family member/caregiver        Problem List There are no active problems to display for this patient.  Barrie Folk SPT 11/30/2014   8:33 AM  This entire session was  performed under direct supervision and direction of a licensed Chiropractor . I have personally read, edited and approve of the note as written.  Hopkins,Margaret PT, DPT 11/30/2014, 8:33 AM  [place] MAIN [facility] SERVICES [address] [place], [place], [address] Phone: [phone]   Fax:  [phone]  Name: Jakie Bielak. MRN: [id number] Date of Birth: 1925/12/05

## 2014-12-05 ENCOUNTER — Ambulatory Visit: Payer: Medicare HMO | Admitting: Physical Therapy

## 2014-12-05 ENCOUNTER — Encounter: Payer: Self-pay | Admitting: Physical Therapy

## 2014-12-05 DIAGNOSIS — R296 Repeated falls: Secondary | ICD-10-CM

## 2014-12-05 DIAGNOSIS — R531 Weakness: Secondary | ICD-10-CM

## 2014-12-05 DIAGNOSIS — R269 Unspecified abnormalities of gait and mobility: Secondary | ICD-10-CM | POA: Diagnosis not present

## 2014-12-05 NOTE — Therapy (Addendum)
[place] MAIN [facility] SERVICES [address] [place], [place], [address] Phone: [phone]   Fax:  [phone]  Physical Therapy Treatment/Progress Note From 11/08/14 to 12/05/14  Patient Details  Name: Stephen Roth. MRN: [id number] Date of Birth: 07-20-1925 Referring Provider: Tama High III  Encounter Date: 12/05/2014      PT End of Session - 12/05/14 1443    Visit Number 7   Number of Visits 17   Date for PT Re-Evaluation 01/03/15   Authorization Type Gcode 1   Authorization Time Period 10    PT Start Time 1430   PT Stop Time 1515   PT Time Calculation (min) 45 min   Equipment Utilized During Treatment Gait belt   Activity Tolerance Patient tolerated treatment well   Behavior During Therapy WFL for tasks assessed/performed      Past Medical History  Diagnosis Date  . CHF (congestive heart failure) [facility])     Past Surgical History  Procedure Laterality Date  . Pacemaker insertion  2006    Pace maker, defibulator    There were no vitals filed for this visit.  Visit Diagnosis:  Repeated falls  Decreased strength  Abnormality of gait      Subjective Assessment - 12/05/14 1442    Subjective Patient reports that he is doing well. He states, "I am looking forward to doing my exercises." Patient reports compliance with HEP; He reports that they haven't gotten the walker yet as he is hoping that he won't need it.   Pertinent History Bilateral knee replacements, one in 2006, one in 2011. He also states that he has a pacemaker placment in approx 2006.    How long can you sit comfortably? n.a    How long can you stand comfortably? 15 - 20 minutes with use of cane or HHA.    How long can you walk comfortably? a couple of blocks with [facility]    Diagnostic tests no recent imaging    Patient Stated Goals improve balance. play golf.   Currently in Pain? No/denies            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; used SPC;  slight improvement from last reassessment on 11/13/14 which was 625 feet   Standardized Balance Assessment   10 Meter Walk 0.825 m/s (11.5 sec) with SPC; increased risk for falls; improved from initial eval on 11/08/14 which was 0.6 m/s   Berg Balance Test   Sit to Stand Able to stand without using hands and stabilize independently   Standing Unsupported Able to stand safely 2 minutes   Sitting with Back Unsupported but Feet Supported on Floor or Stool Able to sit safely and securely 2 minutes   Stand to Sit Sits safely with minimal use of hands   Transfers Able to transfer safely, minor use of hands   Standing Unsupported with Eyes Closed Able to stand 10 seconds safely   Standing Ubsupported with Feet Together Able to place feet together independently and stand for 1 minute with supervision   From Standing, Reach Forward with Outstretched Arm Can reach forward >12 cm safely (5")   From Standing Position, Pick up Object from Floor Able to pick up shoe safely and easily   From Standing Position, Turn to Look Behind Over each Shoulder  Looks behind from both sides and weight shifts well   Turn 360 Degrees Able to turn 360 degrees safely but slowly   Standing Unsupported, Alternately Place Feet on Step/Stool Able to complete >2 steps/needs minimal assist   Standing Unsupported, One Foot in Front Needs help to step but can hold 15 seconds   Standing on One Leg Tries to lift leg/unable to hold 3 seconds but remains standing independently   Total Score 43   Berg comment: 80% risk for falls; slight improvement from  initial eval on 11/08/14 which was 41/56   Dynamic Gait Index   Level Surface Mild Impairment   Change in Gait Speed Mild Impairment   Gait with Horizontal Head Turns Mild Impairment   Gait with Vertical Head Turns Mild Impairment   Gait and  Pivot Turn Mild Impairment   Step Over Obstacle Moderate Impairment   Step Around Obstacles Mild Impairment   Steps Mild Impairment   Total Score 15   DGI comment: <19 is 100% risk for falls; improved from initial eval on 11/08/14 which was 9/24          TREATMENT: Warm up on Nustep level 2 BUE/BLE x4 min (Unbilled);  PT instructed patient in 10 meter walk, Berg Balance assessment, Dynamic gait index, 6 min walk etc to assess progress towards goals. Patient required min Vcs for correct activity including to increase step length and increase DF at heel strike for better gait safety. Patient able to complete 6 min walk with SPC with close supervision-CGA.                            PT Long Term Goals - 12/05/14 1444    PT LONG TERM GOAL #1   Title Patient will be independent with HEP to improve balance and strength to reduce fall risk by 01/03/15   Time 8   Period Weeks   Status On-going   PT LONG TERM GOAL #2   Title Patient will improve Berg balance scale to >46 to indicate decreased fall risk at home and in the community by 01/03/15   Time 8   Period Weeks   Status Partially Met   PT LONG TERM GOAL #3   Title Patient will improve DGI to >19 to indicate decreased fall risk as well as improved function within the community by 01/03/15   Time 8   Period Weeks   Status Partially Met   PT LONG TERM GOAL #4   Title Patient will improve LE strength to at least 4+/5 throughout LE to allow patient to negotiate stairs with decreased difficulty by 01/03/15   Time 8   Period Weeks   Status Partially Met   PT LONG TERM GOAL #5   Title Patient will increase 6 minute walk test by > 300 ft to indicate improve endurance and allow increased ability to access golf course.    Time 8   Period Weeks   Status Partially Met               Plan - 12/05/14 1657    Clinical Impression Statement Pt assessed patient's progress towards goals. Patient demonstrates  significant improvement in balance, particularly with dynamic balance. He demonstrates a slight improvement in gait speed. Patient continues to ambulate with shuffled gait but was able to demonstrate an improved step length. He would benefit from additional skilled PT intervention to improve balance/gait safety and reduce fall risk.  Pt will benefit from skilled therapeutic intervention in order to improve on the following deficits Abnormal gait;Decreased activity tolerance;Decreased balance;Decreased coordination;Decreased endurance;Decreased knowledge of use of DME;Decreased mobility;Decreased range of motion;Decreased safety awareness;Decreased strength;Difficulty walking;Impaired flexibility;Improper body mechanics;Postural dysfunction   Rehab Potential Good   Clinical Impairments Affecting Rehab Potential Positive: relatively high functioning, minimal comorbidities. Negative: decreased perception of deficits.    PT Frequency 2x / week   PT Duration 8 weeks   PT Treatment/Interventions ADLs/Self Care Home Management;Cryotherapy;Moist Heat;DME Instruction;Gait training;Stair training;Functional mobility training;Therapeutic activities;Therapeutic exercise;Balance training;Neuromuscular re-education;Patient/family education;Manual techniques;Passive range of motion   PT Next Visit Plan dynamic balance training. airex balance training.    PT Home Exercise Plan continue as given    Consulted and Agree with Plan of Care Patient;Family member/caregiver          G-Codes - 2014-12-25 1658    Functional Assessment Tool Used  DGI, 31mwalk, Berg Balance, clinical judgement    Functional Limitation Mobility: Walking and moving around   Mobility: Walking and Moving Around Current Status [phone] At least 40 percent but less than 60 percent impaired, limited or restricted   Mobility: Walking and Moving Around Goal Status [phone] At least 20 percent but less than 40 percent impaired, limited or restricted       Problem List There are no active problems to display for this patient.   Hopkins,Hang Ammon PT, DPT 12016/12/12 4:59 PM  [place] [facility] [address] [address]Phone: [phone]  Fax:  [phone] Name: GAnthany Thornhill MRN: [id number] of Birth: 8February 24, 1927

## 2014-12-12 ENCOUNTER — Ambulatory Visit: Payer: Medicare HMO | Admitting: Physical Therapy

## 2014-12-14 ENCOUNTER — Ambulatory Visit: Payer: Medicare HMO | Attending: Internal Medicine | Admitting: Physical Therapy

## 2014-12-14 DIAGNOSIS — R269 Unspecified abnormalities of gait and mobility: Secondary | ICD-10-CM | POA: Insufficient documentation

## 2014-12-14 DIAGNOSIS — R296 Repeated falls: Secondary | ICD-10-CM | POA: Insufficient documentation

## 2014-12-14 DIAGNOSIS — M6281 Muscle weakness (generalized): Secondary | ICD-10-CM | POA: Insufficient documentation

## 2014-12-18 ENCOUNTER — Ambulatory Visit: Payer: Medicare HMO | Admitting: Physical Therapy

## 2014-12-18 ENCOUNTER — Encounter: Payer: Self-pay | Admitting: Physical Therapy

## 2014-12-18 DIAGNOSIS — R269 Unspecified abnormalities of gait and mobility: Secondary | ICD-10-CM | POA: Diagnosis present

## 2014-12-18 DIAGNOSIS — R531 Weakness: Secondary | ICD-10-CM

## 2014-12-18 DIAGNOSIS — R296 Repeated falls: Secondary | ICD-10-CM

## 2014-12-18 DIAGNOSIS — M6281 Muscle weakness (generalized): Secondary | ICD-10-CM | POA: Diagnosis not present

## 2014-12-18 NOTE — Therapy (Signed)
[place] MAIN [facility] SERVICES [address] [place], [place], [address] Phone: [phone]   Fax:  [phone]  Physical Therapy Treatment  Patient Details  Name: Stephen Roth. MRN: [id number] Date of Birth: 1925/02/11 Referring Provider: Tama High III  Encounter Date: 12/18/2014      PT End of Session - 12/18/14 1425    Visit Number 8   Number of Visits 17   Date for PT Re-Evaluation 01/03/15   Authorization Type Gcode 2   Authorization Time Period 10    PT Start Time 1307   PT Stop Time 1345   PT Time Calculation (min) 38 min   Equipment Utilized During Treatment Gait belt   Activity Tolerance Patient tolerated treatment well   Behavior During Therapy WFL for tasks assessed/performed      Past Medical History  Diagnosis Date  . CHF (congestive heart failure) [facility])     Past Surgical History  Procedure Laterality Date  . Pacemaker insertion  2006    Pace maker, defibulator    There were no vitals filed for this visit.  Visit Diagnosis:  Repeated falls  Decreased strength  Abnormality of gait      Subjective Assessment - 12/18/14 1321    Subjective Patient reports doing well. He missed last week due to scheduling conflicts. Patient reports no new falls. He reports compliance with HEP   Pertinent History Bilateral knee replacements, one in 2006, one in 2011. He also states that he has a pacemaker placment in approx 2006.    How long can you sit comfortably? n.a    How long can you stand comfortably? 15 - 20 minutes with use of cane or HHA.    How long can you walk comfortably? a couple of blocks with [facility]    Diagnostic tests no recent imaging    Patient Stated Goals improve balance. play golf.   Currently in Pain? No/denies        TREATMENT: Gait on treadmill 1.2 mph with 2 HHA x3 min with mod VCs to increase step length and to increase DF at heel strike for better foot clearance;  Stepping over bolsters  (small and large) #3, x8 reps with mod VCs to increase step length, get closer to the bolster prior to stepping over and to increase hip flexion for better foot clearance;  Side stepping over 1/2 bolster, x20 with 2-0 rail assist, min A and mod VCs to increase step length to allow room for 2nd foot when stepping over. Patient also required cues to increase speed of stepping over bolsters for increased balance and better ease of movement.  Standing on airex, alternate toe taps on 4 inch step with 2-1 rail assist x15; Patient exhibits increased LLE ankle EV during LLE stance.  Patient ambulated around gym 90 feet x1 with SPC, close supervision with cues to increase step length and increase base of support for better balance. Patient tends to ambulate with short shuffled steps and narrow base of support. Patient exhibits increased shoe wearing on lateral side of shoe. He is actually ambulating off part of the sole due to the extent of wear. PT explained the importance of good footwear for good ankle control and to improve balance. Patient verbalized understanding.                          PT Education - 12/18/14 1425    Education provided Yes  Education Details balance exercise, gait safety.   Person(s) Educated Patient   Methods Explanation;Verbal cues   Comprehension Verbalized understanding;Returned demonstration;Verbal cues required             PT Long Term Goals - 12/05/14 1444    PT LONG TERM GOAL #1   Title Patient will be independent with HEP to improve balance and strength to reduce fall risk by 01/03/15   Time 8   Period Weeks   Status On-going   PT LONG TERM GOAL #2   Title Patient will improve Berg balance scale to >46 to indicate decreased fall risk at home and in the community by 01/03/15   Time 8   Period Weeks   Status Partially Met   PT LONG TERM GOAL #3   Title Patient will improve DGI to >19 to indicate decreased fall risk as well as improved  function within the community by 01/03/15   Time 8   Period Weeks   Status Partially Met   PT LONG TERM GOAL #4   Title Patient will improve LE strength to at least 4+/5 throughout LE to allow patient to negotiate stairs with decreased difficulty by 01/03/15   Time 8   Period Weeks   Status Partially Met   PT LONG TERM GOAL #5   Title Patient will increase 6 minute walk test by > 300 ft to indicate improve endurance and allow increased ability to access golf course.    Time 8   Period Weeks   Status Partially Met               Plan - 12/18/14 1426    Clinical Impression Statement Instructed patient in dynamic balance for improved gait safety. Patient had increased difficulty negotiating bolsters. He required increased cues for increased step length and to increase foot clearance for less loss of balance. Patient requires increased cues for better safety. He often forgets the current task requiring cues to continue after a few repetitions. Patient would benefit from additional skilled PT intervention to reduce fall risk and improve gait safety.    Pt will benefit from skilled therapeutic intervention in order to improve on the following deficits Abnormal gait;Decreased activity tolerance;Decreased balance;Decreased coordination;Decreased endurance;Decreased knowledge of use of DME;Decreased mobility;Decreased range of motion;Decreased safety awareness;Decreased strength;Difficulty walking;Impaired flexibility;Improper body mechanics;Postural dysfunction   Rehab Potential Good   Clinical Impairments Affecting Rehab Potential Positive: relatively high functioning, minimal comorbidities. Negative: decreased perception of deficits.    PT Frequency 2x / week   PT Duration 8 weeks   PT Treatment/Interventions ADLs/Self Care Home Management;Cryotherapy;Moist Heat;DME Instruction;Gait training;Stair training;Functional mobility training;Therapeutic activities;Therapeutic exercise;Balance  training;Neuromuscular re-education;Patient/family education;Manual techniques;Passive range of motion   PT Next Visit Plan dynamic balance training. airex balance training.    PT Home Exercise Plan continue as given    Consulted and Agree with Plan of Care Patient;Family member/caregiver        Problem List There are no active problems to display for this patient.   Koki Buxton PT, DPT 12/18/2014, 2:31 PM  [place] MAIN [facility] SERVICES [address] [place], [place], [address] Phone: [phone]   Fax:  [phone]  Name: Haytham Maher. MRN: [id number] Date of Birth: May 28, 1925

## 2014-12-20 ENCOUNTER — Ambulatory Visit: Payer: Medicare HMO | Admitting: Physical Therapy

## 2014-12-20 DIAGNOSIS — R296 Repeated falls: Secondary | ICD-10-CM

## 2014-12-20 DIAGNOSIS — R531 Weakness: Secondary | ICD-10-CM

## 2014-12-20 DIAGNOSIS — M6281 Muscle weakness (generalized): Secondary | ICD-10-CM | POA: Diagnosis not present

## 2014-12-20 DIAGNOSIS — R269 Unspecified abnormalities of gait and mobility: Secondary | ICD-10-CM

## 2014-12-20 NOTE — Therapy (Signed)
[place] MAIN [facility] SERVICES [address] [place], [place], [address] Phone: [phone]   Fax:  [phone]  Physical Therapy Treatment  Patient Details  Name: Stephen Roth. MRN: [id number] Date of Birth: 1925-07-15 Referring Provider: Tama High III  Encounter Date: 12/20/2014      PT End of Session - 12/20/14 1628    Visit Number 9   Number of Visits 17   Date for PT Re-Evaluation 01/03/15   Authorization Type Gcode 3   Authorization Time Period 10    PT Start Time 1304   PT Stop Time 1345   PT Time Calculation (min) 41 min   Equipment Utilized During Treatment Gait belt   Activity Tolerance Patient tolerated treatment well   Behavior During Therapy [facility] for tasks assessed/performed;Impulsive      Past Medical History  Diagnosis Date  . CHF (congestive heart failure) [facility])     Past Surgical History  Procedure Laterality Date  . Pacemaker insertion  2006    Pace maker, defibulator    There were no vitals filed for this visit.  Visit Diagnosis:  Repeated falls  Decreased strength  Abnormality of gait      Subjective Assessment - 12/20/14 1318    Subjective Patient reports he has been working on his HEP at home and being grilled by his daughter who is a PT. No new falls reported.    Pertinent History Bilateral knee replacements, one in 2006, one in 2011. He also states that he has a pacemaker placment in approx 2006.    How long can you sit comfortably? n.a    How long can you stand comfortably? 15 - 20 minutes with use of cane or HHA.    How long can you walk comfortably? a couple of blocks with [facility]    Diagnostic tests no recent imaging    Patient Stated Goals improve balance. play golf.   Currently in Pain? No/denies       Forward step ups to 4" step x10 repetitions for 2 sets  Lateral step ups to 4" step x 10 repetitions for 2 sets  Forward step ups to 6" step x 10 repetitions for 2 sets (step ups  with HHA to increase knee flexion in stride and prepare for bolster training).   Lateral stepping on blue foam pad with hips against posterior rail x 5' bilaterally with cuing to maintain his feet facing forward. Unable to complete successfully, dc'd.  Tandem stance balance x 3 repetitions bilaterally with HHA to set up for 10 second repetitions. Lateral loss of balance, though able to correct with cuing.   5x sit to stand using unilateral UE, no hands on 2nd attempt,  with 2# DB on 3rd attempt Able to complete all without loss of balance.   Agility ladder training in // bars with one foot per square, with marching, then outside of // bars. Difficult for him to find distance for foot placement safely with reciprocal stepping.   Gait training x 60' with cuing for increased step length with SPC, with cuing he was able to increase stride length. WIthout cuing stride length decreased.   Stepping over bolsters x 12 with extensive cuing for getting closer to a bolster prior to stepping over the bolster. Several occassions of hitting bolster with his feet as he was 4-6" from bolster before attempting step.  PT Education - 12/20/14 1628    Education provided Yes   Education Details Educated patient on safety and technique with activity during this session. Continued use of SPC, with frequent reminders to use.    Person(s) Educated Patient   Methods Explanation;Demonstration;Verbal cues   Comprehension Verbal cues required;Returned demonstration;Verbalized understanding             PT Long Term Goals - 12/05/14 1444    PT LONG TERM GOAL #1   Title Patient will be independent with HEP to improve balance and strength to reduce fall risk by 01/03/15   Time 8   Period Weeks   Status On-going   PT LONG TERM GOAL #2   Title Patient will improve Berg balance scale to >46 to indicate decreased fall risk at home and in the community by 01/03/15   Time  8   Period Weeks   Status Partially Met   PT LONG TERM GOAL #3   Title Patient will improve DGI to >19 to indicate decreased fall risk as well as improved function within the community by 01/03/15   Time 8   Period Weeks   Status Partially Met   PT LONG TERM GOAL #4   Title Patient will improve LE strength to at least 4+/5 throughout LE to allow patient to negotiate stairs with decreased difficulty by 01/03/15   Time 8   Period Weeks   Status Partially Met   PT LONG TERM GOAL #5   Title Patient will increase 6 minute walk test by > 300 ft to indicate improve endurance and allow increased ability to access golf course.    Time 8   Period Weeks   Status Partially Met               Plan - 12/20/14 1629    Clinical Impression Statement Patient demonstrates decreased LE strength impacting his balance with narrow BOS. He displays impulsive nature, and requires frequent cuing for safety. He displays intermittent adaptations with commands (instructed patient to get closer to bolsters to allow for increased clearance, which he did ~50% of the time). Patient would benefit from continued PT to increase LE strength, stride length, and higher level balance.    Pt will benefit from skilled therapeutic intervention in order to improve on the following deficits Abnormal gait;Decreased activity tolerance;Decreased balance;Decreased coordination;Decreased endurance;Decreased knowledge of use of DME;Decreased mobility;Decreased range of motion;Decreased safety awareness;Decreased strength;Difficulty walking;Impaired flexibility;Improper body mechanics;Postural dysfunction   Rehab Potential Good   Clinical Impairments Affecting Rehab Potential Positive: relatively high functioning, minimal comorbidities. Negative: decreased perception of deficits.    PT Frequency 2x / week   PT Duration 8 weeks   PT Treatment/Interventions ADLs/Self Care Home Management;Cryotherapy;Moist Heat;DME Instruction;Gait  training;Stair training;Functional mobility training;Therapeutic activities;Therapeutic exercise;Balance training;Neuromuscular re-education;Patient/family education;Manual techniques;Passive range of motion   PT Next Visit Plan LE strengthening, dynamic balance, air-ex balance.    PT Home Exercise Plan continue as given    Consulted and Agree with Plan of Care Patient        Problem List There are no active problems to display for this patient.  Kerman Passey, PT, DPT    12/20/2014, 4:41 PM  [place] MAIN [facility] SERVICES [address] [place], [place], [address] Phone: [phone]   Fax:  [phone]  Name: Stephen Roth. MRN: [id number] Date of Birth: [birth year]

## 2014-12-25 ENCOUNTER — Ambulatory Visit: Payer: Medicare HMO | Admitting: Physical Therapy

## 2014-12-26 ENCOUNTER — Ambulatory Visit: Payer: Medicare HMO | Admitting: Physical Therapy

## 2014-12-26 ENCOUNTER — Encounter: Payer: Self-pay | Admitting: Physical Therapy

## 2014-12-26 DIAGNOSIS — R269 Unspecified abnormalities of gait and mobility: Secondary | ICD-10-CM

## 2014-12-26 DIAGNOSIS — M6281 Muscle weakness (generalized): Secondary | ICD-10-CM | POA: Diagnosis not present

## 2014-12-26 DIAGNOSIS — R531 Weakness: Secondary | ICD-10-CM

## 2014-12-26 DIAGNOSIS — R296 Repeated falls: Secondary | ICD-10-CM

## 2014-12-26 NOTE — Therapy (Signed)
[place] MAIN [facility] SERVICES [address] [place], [place], [address] Phone: [phone]   Fax:  [phone]  Physical Therapy Treatment  Patient Details  Name: Stephen Roth. MRN: [id number] Date of Birth: 1925/06/17 Referring Provider: Tama High III  Encounter Date: 12/26/2014      PT End of Session - 12/26/14 1457    Visit Number 10   Number of Visits 17   Date for PT Re-Evaluation 01/03/15   Authorization Type Gcode 4   Authorization Time Period 10    PT Start Time 1115   PT Stop Time 1200   PT Time Calculation (min) 45 min   Equipment Utilized During Treatment Gait belt   Activity Tolerance Patient tolerated treatment well   Behavior During Therapy [facility] for tasks assessed/performed;Impulsive      Past Medical History  Diagnosis Date  . CHF (congestive heart failure) [facility])     Past Surgical History  Procedure Laterality Date  . Pacemaker insertion  2006    Pace maker, defibulator    There were no vitals filed for this visit.  Visit Diagnosis:  Repeated falls  Decreased strength  Abnormality of gait      Subjective Assessment - 12/26/14 1121    Subjective Patient reports getting appointments mixed up and he had to reschedule; Denies any pain or any new falls;    Pertinent History Bilateral knee replacements, one in 2006, one in 2011. He also states that he has a pacemaker placment in approx 2006.    How long can you sit comfortably? n.a    How long can you stand comfortably? 15 - 20 minutes with use of cane or HHA.    How long can you walk comfortably? a couple of blocks with [facility]    Diagnostic tests no recent imaging    Patient Stated Goals improve balance. play golf.   Currently in Pain? No/denies        Warm up on Nustep level 2 BUE/BLE x5 min (unbilled);  Forward/backward weighted gait 12.5# x3 reps with CGA/min A for balance control; Patient able to demonstrate good step length for reciprocal  gait going forward but required min VCs when walking backwards to increase step length;  Side stepping in parallel bars red tband x3 laps with min VCs to keep feet forward and to increase hip abduction for better strengthening;  Seated: Ankle DF 2x15 bilaterally with red tband with mod VCs to increase DF for increased strengthening;  Quantum leg press, BLE 90# x12, 105# x12 with min VCs to slow down LE movement for increased strengthening;  Standing: Heel raises 2x15 with min VCs to increase heel raise and avoid knee flexion for increased ankle strengthening;  Seated: Green tband hip flexion march 2x10 bilaterally; Green tband hip abduction x15 with mod VCs to increase LLE hip abduction; Patient required min-moderate verbal/tactile cues for correct exercise technique.   Gait in hallway with SPC x4 laps with and without head turns; Patient required mod Vcs to increase step length and increase DF at heel striek;                           PT Education - 12/26/14 1454    Education provided Yes   Education Details LE strengthening   Person(s) Educated Patient   Methods Explanation;Verbal cues;Demonstration   Comprehension Verbalized understanding;Returned demonstration;Verbal cues required  PT Long Term Goals - 12/05/14 1444    PT LONG TERM GOAL #1   Title Patient will be independent with HEP to improve balance and strength to reduce fall risk by 01/03/15   Time 8   Period Weeks   Status On-going   PT LONG TERM GOAL #2   Title Patient will improve Berg balance scale to >46 to indicate decreased fall risk at home and in the community by 01/03/15   Time 8   Period Weeks   Status Partially Met   PT LONG TERM GOAL #3   Title Patient will improve DGI to >19 to indicate decreased fall risk as well as improved function within the community by 01/03/15   Time 8   Period Weeks   Status Partially Met   PT LONG TERM GOAL #4   Title Patient will  improve LE strength to at least 4+/5 throughout LE to allow patient to negotiate stairs with decreased difficulty by 01/03/15   Time 8   Period Weeks   Status Partially Met   PT LONG TERM GOAL #5   Title Patient will increase 6 minute walk test by > 300 ft to indicate improve endurance and allow increased ability to access golf course.    Time 8   Period Weeks   Status Partially Met               Plan - 12/26/14 1457    Clinical Impression Statement Patient instructed in LE strengthening. He ambulates with SPC but is still impulsive with gait tasks. Patient required min VCs for correct exercise technique. He reports slight increased fatigue with advanced exercise. Patient had increased difficulty with gait tasks with head turns but when just walking within the gym was able to demonstrate better step length. He would benefit from additional skilled PT intervention to improve LE strength, balance and gait safety;    Pt will benefit from skilled therapeutic intervention in order to improve on the following deficits Abnormal gait;Decreased activity tolerance;Decreased balance;Decreased coordination;Decreased endurance;Decreased knowledge of use of DME;Decreased mobility;Decreased range of motion;Decreased safety awareness;Decreased strength;Difficulty walking;Impaired flexibility;Improper body mechanics;Postural dysfunction   Rehab Potential Good   Clinical Impairments Affecting Rehab Potential Positive: relatively high functioning, minimal comorbidities. Negative: decreased perception of deficits.    PT Frequency 2x / week   PT Duration 8 weeks   PT Treatment/Interventions ADLs/Self Care Home Management;Cryotherapy;Moist Heat;DME Instruction;Gait training;Stair training;Functional mobility training;Therapeutic activities;Therapeutic exercise;Balance training;Neuromuscular re-education;Patient/family education;Manual techniques;Passive range of motion   PT Next Visit Plan LE strengthening,  dynamic balance, air-ex balance.    PT Home Exercise Plan continue as given    Consulted and Agree with Plan of Care Patient        Problem List There are no active problems to display for this patient.   Merdith Boyd PT, DPT 12/26/2014, 3:00 PM  [place] MAIN [facility] SERVICES [address] [place], [place], [address] Phone: [phone]   Fax:  [phone]  Name: Diontae Route. MRN: [id number] Date of Birth: 03/21/1925

## 2014-12-27 ENCOUNTER — Ambulatory Visit: Payer: Medicare HMO | Admitting: Physical Therapy

## 2014-12-28 ENCOUNTER — Ambulatory Visit: Payer: Medicare HMO | Admitting: Physical Therapy

## 2015-01-01 ENCOUNTER — Ambulatory Visit: Payer: Medicare HMO

## 2015-01-01 ENCOUNTER — Other Ambulatory Visit: Payer: Self-pay | Admitting: Neurology

## 2015-01-01 VITALS — BP 138/98 | HR 65

## 2015-01-01 DIAGNOSIS — R269 Unspecified abnormalities of gait and mobility: Secondary | ICD-10-CM

## 2015-01-01 DIAGNOSIS — F03A0 Unspecified dementia, mild, without behavioral disturbance, psychotic disturbance, mood disturbance, and anxiety: Secondary | ICD-10-CM

## 2015-01-01 DIAGNOSIS — M6281 Muscle weakness (generalized): Secondary | ICD-10-CM | POA: Diagnosis not present

## 2015-01-01 DIAGNOSIS — F0390 Unspecified dementia without behavioral disturbance: Secondary | ICD-10-CM

## 2015-01-01 DIAGNOSIS — R296 Repeated falls: Secondary | ICD-10-CM

## 2015-01-01 DIAGNOSIS — R531 Weakness: Secondary | ICD-10-CM

## 2015-01-01 NOTE — Therapy (Addendum)
[place] MAIN [facility] SERVICES [address] [place], [place], [address] Phone: [phone]   Fax:  [phone]  Physical Therapy Treatment  Patient Details  Name: Stephen Roth. MRN: [id number] Date of Birth: Aug 29, 1925 Referring Provider: Tama High III  Encounter Date: 01/01/2015      PT End of Session - 01/01/15 1312    Visit Number 11   Number of Visits 17   Date for PT Re-Evaluation 01/03/15   Authorization Type Gcode 5   Authorization Time Period 10    PT Start Time 1305   PT Stop Time 1347   PT Time Calculation (min) 42 min   Equipment Utilized During Treatment Gait belt   Activity Tolerance Patient tolerated treatment well   Behavior During Therapy WFL for tasks assessed/performed;Impulsive      Past Medical History  Diagnosis Date  . CHF (congestive heart failure) [facility])     Past Surgical History  Procedure Laterality Date  . Pacemaker insertion  2006    Pace maker, defibulator    Filed Vitals:   01/01/15 1307  BP: 138/98  Pulse: 65  SpO2: 100%    Visit Diagnosis:  Decreased strength  Abnormality of gait  Repeated falls      Subjective Assessment - 01/01/15 1308    Subjective Pt reports that he arrived late because "people outside were parking in spaces they shouldn't have." Pt started a new medication to help with anxiety. He is unable to recall the name of the medication at this time. No reported falls since the last therapy session. Pt states that he is performing HEP daily "90%" of the time.   Pertinent History Bilateral knee replacements, one in 2006, one in 2011. He also states that he has a pacemaker placment in approx 2006.    How long can you sit comfortably? n.a    How long can you stand comfortably? 15 - 20 minutes with use of cane or HHA.    How long can you walk comfortably? a couple of blocks with [facility]    Diagnostic tests no recent imaging    Patient Stated Goals improve balance. play  golf.   Currently in Pain? No/denies       TREATMENT  THEREX Warm up on Nustep level 3 BUE/BLE x 5 min during history, spm >75; Forward/backward weighted gait 12.5# x 5 reps with CGA/min A for balance control; Patient is relatively unsafe needing very close guarding; Side stepping weighted gait 7.5# x 5 reps with CGA/minA; Quantum leg press 105# x 10, 120# x 10 min VCs to slow down LE movement for increased strengthening; Standing mini squats 2 x 10 with min VCs for form/technique;  NEUROMUSCULAR RE-EDUCATION Standing heel/toe raises 2 x15 with min VCs to increase heel raise and improve DF, very poor instruction follow with very weak ankles, especially LLE, during second repetition added 2x4 under toes to assist elevating heels. Cues not to push up on bar with UEs; 6" stair toe taps alternating LE x 10 each; Attempted to work on stepping strategy with patient but he has difficulty performing with stepping or ankle strategy. Pt attempts to utilize hip strategy; Rocker board weight shifting over heels and toes to improve proprioception and weight shifting.                          PT Education - 01/01/15 1311    Education provided Yes   Education Details  Reinforced HEP   Person(s) Educated Patient   Methods Explanation;Demonstration;Verbal cues   Comprehension Verbalized understanding;Returned demonstration             PT Long Term Goals - 12/05/14 1444    PT LONG TERM GOAL #1   Title Patient will be independent with HEP to improve balance and strength to reduce fall risk by 01/03/15   Time 8   Period Weeks   Status On-going   PT LONG TERM GOAL #2   Title Patient will improve Berg balance scale to >46 to indicate decreased fall risk at home and in the community by 01/03/15   Time 8   Period Weeks   Status Partially Met   PT LONG TERM GOAL #3   Title Patient will improve DGI to >19 to indicate decreased fall risk as well as improved function within  the community by 01/03/15   Time 8   Period Weeks   Status Partially Met   PT LONG TERM GOAL #4   Title Patient will improve LE strength to at least 4+/5 throughout LE to allow patient to negotiate stairs with decreased difficulty by 01/03/15   Time 8   Period Weeks   Status Partially Met   PT LONG TERM GOAL #5   Title Patient will increase 6 minute walk test by > 300 ft to indicate improve endurance and allow increased ability to access golf course.    Time 8   Period Weeks   Status Partially Met               Plan - 01/01/15 1312    Clinical Impression Statement Pt has no stepping strategy with perturbations and utilizes hip strategy to try to maintain balance. No ankle strategy evident. Pt with very weak ankles bilaterally with very poor L ankle DF strength. Pt is somewhat impulsive throughout session and appears unsteady with ambulation. Pt is a high risk for future falls. Pt encouraged to continue HEP and follow-up as scheduled.   Pt will benefit from skilled therapeutic intervention in order to improve on the following deficits Abnormal gait;Decreased activity tolerance;Decreased balance;Decreased coordination;Decreased endurance;Decreased knowledge of use of DME;Decreased mobility;Decreased range of motion;Decreased safety awareness;Decreased strength;Difficulty walking;Impaired flexibility;Improper body mechanics;Postural dysfunction   Rehab Potential Good   Clinical Impairments Affecting Rehab Potential Positive: relatively high functioning, minimal comorbidities. Negative: decreased perception of deficits.    PT Frequency 2x / week   PT Duration 8 weeks   PT Treatment/Interventions ADLs/Self Care Home Management;Cryotherapy;Moist Heat;DME Instruction;Gait training;Stair training;Functional mobility training;Therapeutic activities;Therapeutic exercise;Balance training;Neuromuscular re-education;Patient/family education;Manual techniques;Passive range of motion   PT Next Visit  Plan Outcome measures, update goals, recert if necessary (likely). Practice with rollator and encourage use, LE strengthening, dynamic balance, air-ex balance.    PT Home Exercise Plan continue as given    Consulted and Agree with Plan of Care Patient        Problem List There are no active problems to display for this patient.  Phillips Grout PT, DPT   Huprich,Jason 01/01/2015, 4:33 PM  [place] MAIN [facility] SERVICES [address] [place], [place], [address] Phone: [phone]   Fax:  [phone]  Name: Stephen Roth. MRN: [id number] Date of Birth: 1925/05/01

## 2015-01-03 ENCOUNTER — Ambulatory Visit: Payer: Medicare HMO

## 2015-01-03 DIAGNOSIS — R269 Unspecified abnormalities of gait and mobility: Secondary | ICD-10-CM

## 2015-01-03 DIAGNOSIS — M6281 Muscle weakness (generalized): Secondary | ICD-10-CM | POA: Diagnosis not present

## 2015-01-03 DIAGNOSIS — R296 Repeated falls: Secondary | ICD-10-CM

## 2015-01-03 DIAGNOSIS — R531 Weakness: Secondary | ICD-10-CM

## 2015-01-03 NOTE — Therapy (Signed)
[place] MAIN [facility] SERVICES [address] [place], [place], [address] Phone: [phone]   Fax:  [phone]  Physical Therapy Treatment  Patient Details  Name: Stephen Roth. MRN: [id number] Date of Birth: 1925-03-10 Referring Provider: Tama High III  Encounter Date: 01/03/2015      PT End of Session - 01/03/15 1354    Visit Number 12   Number of Visits 17   Date for PT Re-Evaluation 01/03/15   Authorization Type Gcode 1   Authorization Time Period 10    PT Start Time 1322   PT Stop Time 1350   PT Time Calculation (min) 28 min   Equipment Utilized During Treatment Gait belt   Activity Tolerance Patient tolerated treatment well   Behavior During Therapy WFL for tasks assessed/performed;Impulsive      Past Medical History  Diagnosis Date  . CHF (congestive heart failure) [facility])     Past Surgical History  Procedure Laterality Date  . Pacemaker insertion  2006    Pace maker, defibulator    There were no vitals filed for this visit.  Visit Diagnosis:  Decreased strength  Abnormality of gait  Repeated falls      Subjective Assessment - 01/03/15 1332    Subjective Pt arrived 21 minutes late for his appointment stating that his dog got out and had to chase it. He states that he is doing well on this date without any questions or concerns. No reported pain at this time. Pt states he is not doing his exercises as much as he should due to having company over to visit.    Pertinent History Bilateral knee replacements, one in 2006, one in 2011. He also states that he has a pacemaker placment in approx 2006.    How long can you sit comfortably? n.a    How long can you stand comfortably? 15 - 20 minutes with use of cane or HHA.    How long can you walk comfortably? a couple of blocks with [facility]    Diagnostic tests no recent imaging    Patient Stated Goals improve balance. play golf.   Currently in Pain? No/denies             [facility] PT Assessment - 01/03/15 0001    Standardized Balance Assessment   Standardized Balance Assessment 10 meter walk test   10 Meter Walk 0.704 m/s with spc   Berg Balance Test   Sit to Stand Able to stand without using hands and stabilize independently   Standing Unsupported Able to stand safely 2 minutes   Sitting with Back Unsupported but Feet Supported on Floor or Stool Able to sit safely and securely 2 minutes   Stand to Sit Sits safely with minimal use of hands   Transfers Able to transfer safely, minor use of hands   Standing Unsupported with Eyes Closed Able to stand 10 seconds safely   Standing Ubsupported with Feet Together Able to place feet together independently and stand for 1 minute with supervision   From Standing, Reach Forward with Outstretched Arm Can reach forward >12 cm safely (5")   From Standing Position, Pick up Object from Floor Able to pick up shoe safely and easily   From Standing Position, Turn to Look Behind Over each Shoulder Looks behind one side only/other side shows less weight shift   Turn 360 Degrees Able to turn 360 degrees safely but slowly   Standing Unsupported, Alternately Place Feet on Step/Stool  Able to complete >2 steps/needs minimal assist   Standing Unsupported, One Foot in Front Able to plae foot ahead of the other independently and hold 30 seconds   Standing on One Leg Tries to lift leg/unable to hold 3 seconds but remains standing independently   Total Score 44   Dynamic Gait Index   Level Surface Mild Impairment   Change in Gait Speed Mild Impairment   Gait with Horizontal Head Turns Mild Impairment   Gait with Vertical Head Turns Mild Impairment   Gait and Pivot Turn Mild Impairment   Step Over Obstacle Moderate Impairment   Step Around Obstacles Normal   Steps Moderate Impairment   Total Score 15      TREATMENT   PHYSICAL PERFORMANCE  Completed BERG, DGI, 57mgait speed, and MMT with patient. MMT: hip flexion  4-/5, knee extension: 4+/5, knee flexion 4+/5, ankle DF: R: 4+/5, L: 3+/5, shoulder flexion:4+/5, elbow flexion: 4+/5, elbow extension: 4/5, grip strength: R: WNL, L: moderate loss; Updated goals and discussed results of outcome measures/plan of care  GAIT TRAINING Practiced safe ambulation with rollator including turns, stops, and locking breaks. Encouraged pt to consider use of rollator for safety. Considerable improvement in step length and gait speed achieved with use of rollator.                      PT Education - 01/03/15 1532    Education provided Yes   Education Details Reinfroced HEP, rollator training, plan of care   Person(s) Educated Patient   Methods Explanation;Demonstration;Verbal cues   Comprehension Verbalized understanding;Returned demonstration             PT Long Term Goals - 01/03/15 1340    PT LONG TERM GOAL #1   Title Patient will be independent with HEP to improve balance and strength to reduce fall risk by 01/03/15   Baseline 01/03/15: poor consistency with exercise.    Time 8   Period Weeks   Status On-going   PT LONG TERM GOAL #2   Title Patient will improve Berg balance scale to >46 to indicate decreased fall risk at home and in the community by 01/03/15   Baseline 01/03/15: 44/56, essentially unchanged from initial evaluaiton   Time 8   Period Weeks   Status On-going   PT LONG TERM GOAL #3   Title Patient will improve DGI to >19 to indicate decreased fall risk as well as improved function within the community by 01/03/15   Baseline 01/03/15: 15/24, unchanged from initial evaluation   Time 8   Period Weeks   Status On-going   PT LONG TERM GOAL #4   Title Patient will improve LE strength to at least 4+/5 throughout LE to allow patient to negotiate stairs with decreased difficulty by 01/03/15   Baseline 01/03/15: not met on this date   Time 8   Period Weeks   Status Partially Met   PT LONG TERM GOAL #5   Title Patient will  increase 6 minute walk test by > 300 ft to indicate improve endurance and allow increased ability to access golf course.    Baseline 01/03/15: pt arrived late and not enough time to assess.    Time 8   Period Weeks   Status Partially Met               Plan - 01/03/15 1355    Clinical Impression Statement Pt demonstrates no meaningful change in outcome measures at this  time from initial evaluation. He reports poor compliance with HEP which is likely contributing to lack of progres. Pt continues to demonstrate poor safety with ambulation. Considerable increase in step length and gait speed with use of rollator. Pt would benefit from another 4 weeks of skilled PT services to see if any measurable change in balance or safety awarness can be achieved. Will reinforce importance of use of rollator for ambulation and work on safety strategies. Pt will continued to benefit from skilled PT services for an additional 4 weeks.    Pt will benefit from skilled therapeutic intervention in order to improve on the following deficits Abnormal gait;Decreased activity tolerance;Decreased balance;Decreased coordination;Decreased endurance;Decreased knowledge of use of DME;Decreased mobility;Decreased range of motion;Decreased safety awareness;Decreased strength;Difficulty walking;Impaired flexibility;Improper body mechanics;Postural dysfunction   Rehab Potential Good   Clinical Impairments Affecting Rehab Potential Positive: relatively high functioning, minimal comorbidities. Negative: decreased perception of deficits.    PT Frequency 2x / week   PT Duration 8 weeks   PT Treatment/Interventions ADLs/Self Care Home Management;Cryotherapy;Moist Heat;DME Instruction;Gait training;Stair training;Functional mobility training;Therapeutic activities;Therapeutic exercise;Balance training;Neuromuscular re-education;Patient/family education;Manual techniques;Passive range of motion   PT Next Visit Plan Practice with  rollator and encourage use, LE strengthening, dynamic balance, air-ex balance. Encourage HEP   PT Home Exercise Plan continue as given    Consulted and Agree with Plan of Care Patient          G-Codes - Jan 13, 2015 1540    Functional Assessment Tool Used DGI, BERG, clinical judgement   Functional Limitation Mobility: Walking and moving around   Mobility: Walking and Moving Around Current Status (F7510) At least 40 percent but less than 60 percent impaired, limited or restricted   Mobility: Walking and Moving Around Goal Status (C5852) At least 20 percent but less than 40 percent impaired, limited or restricted      Problem List There are no active problems to display for this patient.  Phillips Grout PT, DPT   Dearius Hoffmann Jan 13, 2015, 3:44 PM  [place] MAIN [facility] SERVICES [address] [place], [place], [address] Phone: [phone]   Fax:  [phone]  Name: Fintan Grater. MRN: [id number] Date of Birth: 1925/10/25

## 2015-01-11 ENCOUNTER — Ambulatory Visit
Admission: RE | Admit: 2015-01-11 | Discharge: 2015-01-11 | Disposition: A | Discharge status: Home / Self Care | Payer: Medicare HMO | Source: Ambulatory Visit | Attending: Neurology | Admitting: Neurology

## 2015-01-11 DIAGNOSIS — F03A0 Unspecified dementia, mild, without behavioral disturbance, psychotic disturbance, mood disturbance, and anxiety: Secondary | ICD-10-CM

## 2015-01-11 DIAGNOSIS — F0390 Unspecified dementia without behavioral disturbance: Secondary | ICD-10-CM | POA: Diagnosis not present

## 2015-01-11 DIAGNOSIS — R9089 Other abnormal findings on diagnostic imaging of central nervous system: Secondary | ICD-10-CM | POA: Insufficient documentation

## 2015-01-16 ENCOUNTER — Encounter: Payer: Self-pay | Admitting: Physical Therapy

## 2015-01-16 ENCOUNTER — Ambulatory Visit: Payer: Medicare HMO | Attending: Internal Medicine | Admitting: Physical Therapy

## 2015-01-16 DIAGNOSIS — M6281 Muscle weakness (generalized): Secondary | ICD-10-CM | POA: Diagnosis not present

## 2015-01-16 DIAGNOSIS — R531 Weakness: Secondary | ICD-10-CM

## 2015-01-16 DIAGNOSIS — R296 Repeated falls: Secondary | ICD-10-CM | POA: Diagnosis present

## 2015-01-16 DIAGNOSIS — R269 Unspecified abnormalities of gait and mobility: Secondary | ICD-10-CM | POA: Diagnosis present

## 2015-01-17 NOTE — Therapy (Signed)
[place] MAIN [facility] SERVICES [address] [place], [place], [address] Phone: [phone]   Fax:  [phone]  Physical Therapy Treatment  Patient Details  Name: Stephen Roth. MRN: [id number] Date of Birth: 1925/06/08 Referring Provider: Tama High III  Encounter Date: 01/16/2015      PT End of Session - 01/17/15 1226    Visit Number 13   Number of Visits 21   Date for PT Re-Evaluation 02/02/15   Authorization Type Gcode 2   Authorization Time Period 10    PT Start Time 1515   PT Stop Time 1600   PT Time Calculation (min) 45 min   Equipment Utilized During Treatment Gait belt   Activity Tolerance Patient tolerated treatment well   Behavior During Therapy [facility] for tasks assessed/performed;Impulsive      Past Medical History  Diagnosis Date  . CHF (congestive heart failure) [facility])     Past Surgical History  Procedure Laterality Date  . Pacemaker insertion  2006    Pace maker, defibulator    There were no vitals filed for this visit.  Visit Diagnosis:  Decreased strength  Abnormality of gait  Repeated falls      Subjective Assessment - 01/16/15 1523    Subjective Patient reports doing well. He reports that his daughter got him a rollator but he presents to therapy without it because he says that it is hard for him to get in/out of the car;    Pertinent History Bilateral knee replacements, one in 2006, one in 2011. He also states that he has a pacemaker placment in approx 2006.    How long can you sit comfortably? n.a    How long can you stand comfortably? 15 - 20 minutes with use of cane or HHA.    How long can you walk comfortably? a couple of blocks with [facility]    Diagnostic tests no recent imaging    Patient Stated Goals improve balance. play golf.   Currently in Pain? No/denies        Warm up on Nustep level 2 BUE/BLE x4 min (unbilled);   Gait training with rollator: Negotiating small bolsters x10 reps with  mod Vcs for picking walker up in the front and getting front wheels over bolster then lifting in the back then stepping over bolster; required cues to stay close to rollator with negotiating obstacles; Negotiating cones #8 x3 reps with mod VCs to stay close to rollator when negotiating tight spaces; Picking up cones #8 and stacking x1 rep with close supervision for safety;  Gait in hallway with rollator with head turns (multi direction) with mod VCs to stay close to rollator and avoid turning side/side for safety x200 feet; Gait in hallway fast/slow with rollator x200 feet; Patient has difficulty adjusting gait speed due to fatigue and weakness in legs; He is able to walk faster with rollator as compared to [facility];    PT reinforced HEP with instruction to increase compliance with standing tband exericse. Patient reports that he does standing exercise but hasn't been using tband due to forgetfulness.                        PT Education - 01/17/15 1225    Education provided Yes   Education Details gait safety with rollator, reinforced HEP   Person(s) Educated Patient   Methods Explanation;Verbal cues   Comprehension Verbalized understanding;Returned demonstration;Verbal cues required  PT Long Term Goals - 01/17/15 1229    PT LONG TERM GOAL #1   Title Patient will be independent with HEP to improve balance and strength to reduce fall risk by 02/02/15   Baseline 01/03/15: poor consistency with exercise.    Time 8   Period Weeks   Status On-going   PT LONG TERM GOAL #2   Title Patient will improve Berg balance scale to >46 to indicate decreased fall risk at home and in the community by 02/02/15   Baseline 01/03/15: 44/56, essentially unchanged from initial evaluaiton   Time 8   Period Weeks   Status On-going   PT LONG TERM GOAL #3   Title Patient will improve DGI to >19 to indicate decreased fall risk as well as improved function within the community by  02/02/15   Baseline 01/03/15: 15/24, unchanged from initial evaluation   Time 8   Period Weeks   Status On-going   PT LONG TERM GOAL #4   Title Patient will improve LE strength to at least 4+/5 throughout LE to allow patient to negotiate stairs with decreased difficulty by 02/02/15   Baseline 01/03/15: not met on this date   Time 8   Period Weeks   Status Partially Met   PT LONG TERM GOAL #5   Title Patient will increase 6 minute walk test by > 300 ft to indicate improve endurance and allow increased ability to access golf course. by 02/02/15   Baseline 01/03/15: pt arrived late and not enough time to assess.    Time 8   Period Weeks   Status Partially Met               Plan - 01/17/15 1227    Clinical Impression Statement Patient instructed in gait safety with rollator. PT educated patient on pros/cons of using rollator for community tasks. Patient required min-mod VCs for correct gait safety staying close to rollator when negotiating obstacles. Often he would forget to lift the rollator correctly when negotiating bolsters due to confusion and requiring increased cues for safety; Patient would benefit from additional skilled PT intervention with gait training with rollator for safety;    Pt will benefit from skilled therapeutic intervention in order to improve on the following deficits Abnormal gait;Decreased activity tolerance;Decreased balance;Decreased coordination;Decreased endurance;Decreased knowledge of use of DME;Decreased mobility;Decreased range of motion;Decreased safety awareness;Decreased strength;Difficulty walking;Impaired flexibility;Improper body mechanics;Postural dysfunction   Rehab Potential Good   Clinical Impairments Affecting Rehab Potential Positive: relatively high functioning, minimal comorbidities. Negative: decreased perception of deficits.    PT Frequency 2x / week   PT Duration 4 weeks   PT Treatment/Interventions ADLs/Self Care Home  Management;Cryotherapy;Moist Heat;DME Instruction;Gait training;Stair training;Functional mobility training;Therapeutic activities;Therapeutic exercise;Balance training;Neuromuscular re-education;Patient/family education;Manual techniques;Passive range of motion   PT Next Visit Plan Practice with rollator and encourage use, LE strengthening, dynamic balance, air-ex balance. Encourage HEP   PT Home Exercise Plan continue as given    Consulted and Agree with Plan of Care Patient        Problem List There are no active problems to display for this patient.   Dominic Rhome PT, DPT 01/17/2015, 12:31 PM  [place] MAIN [facility] SERVICES [address] [place], [place], [address] Phone: [phone]   Fax:  [phone]  Name: Stephen Roth. MRN: [id number] Date of Birth: 01-07-1926

## 2015-01-18 ENCOUNTER — Encounter: Payer: Self-pay | Admitting: Physical Therapy

## 2015-01-18 ENCOUNTER — Ambulatory Visit: Payer: Medicare HMO | Admitting: Physical Therapy

## 2015-01-18 DIAGNOSIS — R269 Unspecified abnormalities of gait and mobility: Secondary | ICD-10-CM

## 2015-01-18 DIAGNOSIS — R531 Weakness: Secondary | ICD-10-CM

## 2015-01-18 DIAGNOSIS — M6281 Muscle weakness (generalized): Secondary | ICD-10-CM | POA: Diagnosis not present

## 2015-01-18 DIAGNOSIS — R296 Repeated falls: Secondary | ICD-10-CM

## 2015-01-19 NOTE — Therapy (Signed)
[place] MAIN [facility] SERVICES [address] [place], [place], [address] Phone: [phone]   Fax:  [phone]  Physical Therapy Treatment  Patient Details  Name: Stephen Roth. MRN: [id number] Date of Birth: 12/01/1925 Referring Provider: Tama High III  Encounter Date: 01/18/2015      PT End of Session - 01/19/15 0810    Visit Number 14   Number of Visits 21   Date for PT Re-Evaluation 02/02/15   Authorization Type Gcode 3   Authorization Time Period 10    PT Start Time 1510   PT Stop Time 1600   PT Time Calculation (min) 50 min   Equipment Utilized During Treatment Gait belt   Activity Tolerance Patient tolerated treatment well   Behavior During Therapy [facility] for tasks assessed/performed;Impulsive      Past Medical History  Diagnosis Date  . CHF (congestive heart failure) [facility])     Past Surgical History  Procedure Laterality Date  . Pacemaker insertion  2006    Pace maker, defibulator    There were no vitals filed for this visit.  Visit Diagnosis:  Decreased strength  Abnormality of gait  Repeated falls      Subjective Assessment - 01/18/15 1514    Subjective Patient presents to therapy with SPC; He reports that he was going to bring rollator but didn't; Patient reports that he is supposed to be getting a procedure (shunt) to assess for NPH;    Pertinent History Bilateral knee replacements, one in 2006, one in 2011. He also states that he has a pacemaker placment in approx 2006.    How long can you sit comfortably? n.a    How long can you stand comfortably? 15 - 20 minutes with use of cane or HHA.    How long can you walk comfortably? a couple of blocks with [facility]    Diagnostic tests no recent imaging    Patient Stated Goals improve balance. play golf.   Currently in Pain? No/denies         Warm up on Nustep level 3 BUE/BLE x4 min (unbilled);  Gait training with rollator: Negotiating bolsters #3 (various  sizes) x10 reps with min-mod VCs for correct way to lift rollator and to stay close to rollator for better gait safety when negotiating obstacles; Stepping up and over treadmill (curb training) x8 reps with min VCs to squeeze breaks when stepping down from treadmill for increased safety; Mock set up with cones for grocery shopping, picking up basket and putting it in rollator and picking up different objects like shopping in grocery store with education on how to manage items with rollator rather than using SPC and buggy for increased gait safety getting in/out of store; Outside on uneven surfaces: Negotiating long ramp x1 with min VCs to stay close to rollator and use breaks when descending ramp for better safety and to slow down gait; Negotiating curb x10 reps with mod VCs for correct technique lifting rollator and using breaks when needed for safety; Patient exhibits increased shuffling steps with shorter step length and forward trunk lean with increased fatigue requiring mod VCs to increase step length and improve foot clearance for better gait safety; He was instructed to try using rollator at grocery store over next week to improve use of rollator while in community;                          PT Education -  01/19/15 0810    Education provided Yes   Education Details gati safety with rollator with negotiating obstacles and walking on uneven surfaces;   Person(s) Educated Patient   Methods Explanation;Demonstration;Verbal cues   Comprehension Verbalized understanding;Returned demonstration;Verbal cues required             PT Long Term Goals - 01/17/15 1229    PT LONG TERM GOAL #1   Title Patient will be independent with HEP to improve balance and strength to reduce fall risk by 02/02/15   Baseline 01/03/15: poor consistency with exercise.    Time 8   Period Weeks   Status On-going   PT LONG TERM GOAL #2   Title Patient will improve Berg balance scale to >46 to  indicate decreased fall risk at home and in the community by 02/02/15   Baseline 01/03/15: 44/56, essentially unchanged from initial evaluaiton   Time 8   Period Weeks   Status On-going   PT LONG TERM GOAL #3   Title Patient will improve DGI to >19 to indicate decreased fall risk as well as improved function within the community by 02/02/15   Baseline 01/03/15: 15/24, unchanged from initial evaluation   Time 8   Period Weeks   Status On-going   PT LONG TERM GOAL #4   Title Patient will improve LE strength to at least 4+/5 throughout LE to allow patient to negotiate stairs with decreased difficulty by 02/02/15   Baseline 01/03/15: not met on this date   Time 8   Period Weeks   Status Partially Met   PT LONG TERM GOAL #5   Title Patient will increase 6 minute walk test by > 300 ft to indicate improve endurance and allow increased ability to access golf course. by 02/02/15   Baseline 01/03/15: pt arrived late and not enough time to assess.    Time 8   Period Weeks   Status Partially Met               Plan - 01/19/15 0810    Clinical Impression Statement Instructed patient in gait safety with rollator while negotiating obstacles and walking on uneven surfaces. Patient responded fair to cues. He did require increased repetition for increased learning as patient would often forget correct lifting technique of rollator due to confusion/dementia. He was encouraged to start using rollator in community more for increased safety. Patient would benefit from additional skilled PT intervention to improve balance/gait safety and increase functional mobility.    Pt will benefit from skilled therapeutic intervention in order to improve on the following deficits Abnormal gait;Decreased activity tolerance;Decreased balance;Decreased coordination;Decreased endurance;Decreased knowledge of use of DME;Decreased mobility;Decreased range of motion;Decreased safety awareness;Decreased strength;Difficulty  walking;Impaired flexibility;Improper body mechanics;Postural dysfunction   Rehab Potential Good   Clinical Impairments Affecting Rehab Potential Positive: relatively high functioning, minimal comorbidities. Negative: decreased perception of deficits.    PT Frequency 2x / week   PT Duration 4 weeks   PT Treatment/Interventions ADLs/Self Care Home Management;Cryotherapy;Moist Heat;DME Instruction;Gait training;Stair training;Functional mobility training;Therapeutic activities;Therapeutic exercise;Balance training;Neuromuscular re-education;Patient/family education;Manual techniques;Passive range of motion   PT Next Visit Plan Practice with rollator and encourage use, LE strengthening, dynamic balance, air-ex balance. Encourage HEP   PT Home Exercise Plan continue as given    Consulted and Agree with Plan of Care Patient        Problem List There are no active problems to display for this patient.   Ravenna Legore PT, DPT 01/19/2015, 8:12 AM  [place] MAIN [facility] SERVICES [place], [place], [address] Phone: [phone]   Fax:  [phone]  Name: Stephen Roth. MRN: [id number] Date of Birth: 12-02-1925

## 2015-01-22 ENCOUNTER — Ambulatory Visit: Payer: Medicare HMO | Admitting: Physical Therapy

## 2015-01-24 ENCOUNTER — Encounter: Payer: Self-pay | Admitting: Physical Therapy

## 2015-01-24 ENCOUNTER — Ambulatory Visit: Payer: Medicare HMO | Admitting: Physical Therapy

## 2015-01-24 DIAGNOSIS — R269 Unspecified abnormalities of gait and mobility: Secondary | ICD-10-CM

## 2015-01-24 DIAGNOSIS — R531 Weakness: Secondary | ICD-10-CM

## 2015-01-24 DIAGNOSIS — M6281 Muscle weakness (generalized): Secondary | ICD-10-CM | POA: Diagnosis not present

## 2015-01-24 DIAGNOSIS — R296 Repeated falls: Secondary | ICD-10-CM

## 2015-01-24 NOTE — Therapy (Signed)
[place] MAIN [facility] SERVICES [address] [place], [place], [address] Phone: [phone]   Fax:  [phone]  Physical Therapy Treatment  Patient Details  Name: Stephen Roth. MRN: [id number] Date of Birth: 02-19-1925 Referring Provider: Tama High III  Encounter Date: 01/24/2015      PT End of Session - 01/24/15 1446    Visit Number 15   Number of Visits 21   Date for PT Re-Evaluation 02/02/15   Authorization Type Gcode 4   Authorization Time Period 10    PT Start Time 1445   PT Stop Time 1530   PT Time Calculation (min) 45 min   Equipment Utilized During Treatment Gait belt   Activity Tolerance Patient tolerated treatment well   Behavior During Therapy WFL for tasks assessed/performed;Impulsive      Past Medical History  Diagnosis Date  . CHF (congestive heart failure) [facility])     Past Surgical History  Procedure Laterality Date  . Pacemaker insertion  2006    Pace maker, defibulator    There were no vitals filed for this visit.  Visit Diagnosis:  Decreased strength  Abnormality of gait  Repeated falls      Subjective Assessment - 01/24/15 1455    Subjective Patient presents to therapy with rollator; He reports using rollator a little more; Patient denies any new falls; He reports being scheduled for nerve conduction tests this week; Patient reports taking the rollator to the grocery store and didn't have any trouble.    Pertinent History Bilateral knee replacements, one in 2006, one in 2011. He also states that he has a pacemaker placment in approx 2006.    How long can you sit comfortably? n.a    How long can you stand comfortably? 15 - 20 minutes with use of cane or HHA.    How long can you walk comfortably? a couple of blocks with [facility]    Diagnostic tests no recent imaging    Patient Stated Goals improve balance. play golf.   Currently in Pain? No/denies        Warm up on Nustep level 3 BUE/BLE x4 min  (unbilled);  Gait training with rollator: Negotiating bolsters #3 (various sizes) x6 reps with min-mod VCs for correct way to lift rollator and to stay close to rollator for better gait safety when negotiating obstacles; Negotiating cones #6, x4 reps with cues for positioning walker and going around cones for increased safety;  Gait with rollator: Forward/backward x100 feet with min vcs to increase step length for better foot clearance; Forward with head turns up/down, side/side x150 feet each with min Vcs to increase step length and DF at heel strike for better foot clearance with advanced gait tasks; PT educated patient with turns with rollator with mod VCs to keep rollator close to self for better gait safety x6 turns;  Patient continues to have difficulty with gait safety with rollator.                           PT Education - 01/24/15 1456    Education provided Yes   Education Details gait safety with rollator;    Person(s) Educated Patient   Methods Explanation;Verbal cues   Comprehension Verbalized understanding;Returned demonstration;Verbal cues required             PT Long Term Goals - 01/17/15 1229    PT LONG TERM GOAL #1   Title Patient  will be independent with HEP to improve balance and strength to reduce fall risk by 02/02/15   Baseline 01/03/15: poor consistency with exercise.    Time 8   Period Weeks   Status On-going   PT LONG TERM GOAL #2   Title Patient will improve Berg balance scale to >46 to indicate decreased fall risk at home and in the community by 02/02/15   Baseline 01/03/15: 44/56, essentially unchanged from initial evaluaiton   Time 8   Period Weeks   Status On-going   PT LONG TERM GOAL #3   Title Patient will improve DGI to >19 to indicate decreased fall risk as well as improved function within the community by 02/02/15   Baseline 01/03/15: 15/24, unchanged from initial evaluation   Time 8   Period Weeks   Status On-going    PT LONG TERM GOAL #4   Title Patient will improve LE strength to at least 4+/5 throughout LE to allow patient to negotiate stairs with decreased difficulty by 02/02/15   Baseline 01/03/15: not met on this date   Time 8   Period Weeks   Status Partially Met   PT LONG TERM GOAL #5   Title Patient will increase 6 minute walk test by > 300 ft to indicate improve endurance and allow increased ability to access golf course. by 02/02/15   Baseline 01/03/15: pt arrived late and not enough time to assess.    Time 8   Period Weeks   Status Partially Met               Plan - 01/24/15 1538    Clinical Impression Statement Patient instructed in gait safety with rollator. Patient had increased difficulty with negotiating big bolsters with personal rollator. his rollator is a little heavy and required mod VCs for safety with lifting rollator to avoid back discomfort. Patient continues to demonstrate increased foot drag and shorter step length with advanced gait tasks. He would benefit from additional skilled PT intervention to improve balance/gait safety and reduce fall risk.    Pt will benefit from skilled therapeutic intervention in order to improve on the following deficits Abnormal gait;Decreased activity tolerance;Decreased balance;Decreased coordination;Decreased endurance;Decreased knowledge of use of DME;Decreased mobility;Decreased range of motion;Decreased safety awareness;Decreased strength;Difficulty walking;Impaired flexibility;Improper body mechanics;Postural dysfunction   Rehab Potential Good   Clinical Impairments Affecting Rehab Potential Positive: relatively high functioning, minimal comorbidities. Negative: decreased perception of deficits.    PT Frequency 2x / week   PT Duration 4 weeks   PT Treatment/Interventions ADLs/Self Care Home Management;Cryotherapy;Moist Heat;DME Instruction;Gait training;Stair training;Functional mobility training;Therapeutic activities;Therapeutic  exercise;Balance training;Neuromuscular re-education;Patient/family education;Manual techniques;Passive range of motion   PT Next Visit Plan Practice with rollator and encourage use, LE strengthening, dynamic balance, air-ex balance. Encourage HEP   PT Home Exercise Plan continue as given    Consulted and Agree with Plan of Care Patient        Problem List There are no active problems to display for this patient.   Trotter,Margaret PT, DPT 01/24/2015, 3:39 PM  [place] MAIN [facility] SERVICES [address] [place], [place], [address] Phone: [phone]   Fax:  [phone]  Name: Stephen Roth. MRN: [id number] Date of Birth: 1925/02/04

## 2015-01-26 ENCOUNTER — Ambulatory Visit: Payer: Medicare HMO | Admitting: Physical Therapy

## 2015-01-29 ENCOUNTER — Encounter: Payer: Self-pay | Admitting: Physical Therapy

## 2015-01-29 ENCOUNTER — Ambulatory Visit: Payer: Medicare HMO | Admitting: Physical Therapy

## 2015-01-29 DIAGNOSIS — M6281 Muscle weakness (generalized): Secondary | ICD-10-CM | POA: Diagnosis not present

## 2015-01-29 DIAGNOSIS — R296 Repeated falls: Secondary | ICD-10-CM

## 2015-01-29 DIAGNOSIS — R531 Weakness: Secondary | ICD-10-CM

## 2015-01-29 DIAGNOSIS — R269 Unspecified abnormalities of gait and mobility: Secondary | ICD-10-CM

## 2015-01-29 NOTE — Therapy (Signed)
[place] [facility] SERVICES [address] [place], [place], [address] Phone: [phone]   Fax:  [phone]  Physical Therapy Treatment  Patient Details  Name: Stephen Roth. MRN: [id number] Date of Birth: November 01, 1925 Referring Provider: Tama High III  Encounter Date: 01/29/2015      PT End of Session - 01/29/15 1433    Visit Number 16   Number of Visits 21   Date for PT Re-Evaluation 02/02/15   Authorization Type Gcode 5   Authorization Time Period 10    PT Start Time 1430   PT Stop Time 1515   PT Time Calculation (min) 45 min   Equipment Utilized During Treatment Gait belt   Activity Tolerance Patient tolerated treatment well   Behavior During Therapy WFL for tasks assessed/performed;Impulsive      Past Medical History  Diagnosis Date  . CHF (congestive heart failure) [facility])     Past Surgical History  Procedure Laterality Date  . Pacemaker insertion  2006    Pace maker, defibulator    There were no vitals filed for this visit.  Visit Diagnosis:  Decreased strength  Abnormality of gait  Repeated falls      Subjective Assessment - 01/29/15 1431    Subjective Patient presents to therapy with SPC: He reports, "I was going to get the rollator out of the car, but it wasn't there." He reports that he thinks he left it at home. Patient presents to therapy with increased ointment on left eye. He states that he is trying to get some cancer burned off the eye and he has to be careful to not get it infected; He also reports that he is going to be admitted to [facility] on Jan 23 for a procedure;    Pertinent History Bilateral knee replacements, one in 2006, one in 2011. He also states that he has a pacemaker placment in approx 2006.    How long can you sit comfortably? n.a    How long can you stand comfortably? 15 - 20 minutes with use of cane or HHA.    How long can you walk comfortably? a couple of blocks with [facility]    Diagnostic tests no recent imaging    Patient Stated Goals improve balance. play golf.   Currently in Pain? No/denies      Warm up on Nustep level 3 BUE/BLE x4 min (unbilled);  Gait training with rollator: Negotiating cones #6, x3 reps with cues for positioning walker and going around cones for increased safety;  Gait with rollator: Forward/backward x100 feet with min vcs to increase step length for better foot clearance; Forward with head turns up/down, side/side x300 feet each with min Vcs to increase step length and DF at heel strike for better foot clearance with advanced gait tasks; PT educated patient with turns with rollator with mod VCs to keep rollator close to self for better gait safety x8 turns; Forward gait with rollator, slow/fast with min VCs to increase step length and increase DF at heel strike for better foot clearance;  Patient continues to have difficulty with gait safety with rollator.   Standing: Alternate toe taps on 4 inch step with 1 rail assist x15 bilaterally with mod VCs to increase erect posture, increase foot clearance and avoid forward trunk flexion; Ascend/descend 4 steps with B rail assist, x2 laps; with forward non-reciprocal gait pattern and mod VCs to increase forward weight shift for improved step negotiation;  PT Education - 01/29/15 1434    Education provided Yes   Education Details gait safety with rollator;    Person(s) Educated Patient   Methods Explanation;Verbal cues   Comprehension Verbalized understanding;Returned demonstration;Verbal cues required             PT Long Term Goals - 01/17/15 1229    PT LONG TERM GOAL #1   Title Patient will be independent with HEP to improve balance and strength to reduce fall risk by 02/02/15   Baseline 01/03/15: poor consistency with exercise.    Time 8   Period Weeks   Status On-going   PT LONG TERM GOAL #2   Title Patient will improve Berg  balance scale to >46 to indicate decreased fall risk at home and in the community by 02/02/15   Baseline 01/03/15: 44/56, essentially unchanged from initial evaluaiton   Time 8   Period Weeks   Status On-going   PT LONG TERM GOAL #3   Title Patient will improve DGI to >19 to indicate decreased fall risk as well as improved function within the community by 02/02/15   Baseline 01/03/15: 15/24, unchanged from initial evaluation   Time 8   Period Weeks   Status On-going   PT LONG TERM GOAL #4   Title Patient will improve LE strength to at least 4+/5 throughout LE to allow patient to negotiate stairs with decreased difficulty by 02/02/15   Baseline 01/03/15: not met on this date   Time 8   Period Weeks   Status Partially Met   PT LONG TERM GOAL #5   Title Patient will increase 6 minute walk test by > 300 ft to indicate improve endurance and allow increased ability to access golf course. by 02/02/15   Baseline 01/03/15: pt arrived late and not enough time to assess.    Time 8   Period Weeks   Status Partially Met               Plan - 01/29/15 1745    Clinical Impression Statement Patient continues to require min-mod VCs for safety with rollator. Instructed patient in dynamic balance tasks with turns/change in speed/head turns etc while walking with rollator. He continues to require cues to stay close to rollator and to increase step length with improved DF at heel strike for increased foot clearance. patient would benefit from additonal skilled PT intervention to improve balance/gait safety and reduce fall risk;    Pt will benefit from skilled therapeutic intervention in order to improve on the following deficits Abnormal gait;Decreased activity tolerance;Decreased balance;Decreased coordination;Decreased endurance;Decreased knowledge of use of DME;Decreased mobility;Decreased range of motion;Decreased safety awareness;Decreased strength;Difficulty walking;Impaired flexibility;Improper  body mechanics;Postural dysfunction   Rehab Potential Good   Clinical Impairments Affecting Rehab Potential Positive: relatively high functioning, minimal comorbidities. Negative: decreased perception of deficits.    PT Frequency 2x / week   PT Duration 4 weeks   PT Treatment/Interventions ADLs/Self Care Home Management;Cryotherapy;Moist Heat;DME Instruction;Gait training;Stair training;Functional mobility training;Therapeutic activities;Therapeutic exercise;Balance training;Neuromuscular re-education;Patient/family education;Manual techniques;Passive range of motion   PT Next Visit Plan Practice with rollator and encourage use, LE strengthening, dynamic balance, air-ex balance. Encourage HEP   PT Home Exercise Plan continue as given    Consulted and Agree with Plan of Care Patient        Problem List There are no active problems to display for this patient.   Parveen Freehling PT, DPT 01/29/2015, 5:46 PM  [place] [facility] [address]  [place], [place], [address] Phone: [phone]   Fax:  [phone]  Name: Stephen Roth. MRN: [id number] Date of Birth: 05-09-1925

## 2015-01-31 ENCOUNTER — Ambulatory Visit: Payer: Medicare HMO | Admitting: Physical Therapy

## 2015-02-05 ENCOUNTER — Encounter: Payer: Medicare HMO | Admitting: Physical Therapy

## 2015-02-07 ENCOUNTER — Ambulatory Visit: Payer: Medicare HMO | Admitting: Physical Therapy

## 2015-05-21 ENCOUNTER — Other Ambulatory Visit: Payer: Medicare HMO

## 2015-06-18 ENCOUNTER — Encounter
Admission: RE | Admit: 2015-06-18 | Discharge: 2015-06-18 | Disposition: A | Discharge status: Home / Self Care | Payer: Medicare HMO | Source: Ambulatory Visit | Attending: Cardiology | Admitting: Cardiology

## 2015-06-18 DIAGNOSIS — Z01812 Encounter for preprocedural laboratory examination: Secondary | ICD-10-CM | POA: Insufficient documentation

## 2015-06-18 DIAGNOSIS — Z01810 Encounter for preprocedural cardiovascular examination: Secondary | ICD-10-CM | POA: Diagnosis not present

## 2015-06-18 HISTORY — DX: Sleep apnea, unspecified: G47.30

## 2015-06-18 HISTORY — DX: Malignant (primary) neoplasm, unspecified: C80.1

## 2015-06-18 HISTORY — DX: (Idiopathic) normal pressure hydrocephalus: G91.2

## 2015-06-18 HISTORY — DX: Unspecified cirrhosis of liver: K74.60

## 2015-06-18 HISTORY — DX: Essential (primary) hypertension: I10

## 2015-06-18 LAB — URINALYSIS COMPLETE WITH MICROSCOPIC (ARMC ONLY)
BACTERIA UA: NONE SEEN
Bilirubin Urine: NEGATIVE
Glucose, UA: NEGATIVE mg/dL
HGB URINE DIPSTICK: NEGATIVE
KETONES UR: NEGATIVE mg/dL
Nitrite: NEGATIVE
PH: 5 (ref 5.0–8.0)
PROTEIN: 30 mg/dL — AB
SPECIFIC GRAVITY, URINE: 1.019 (ref 1.005–1.030)
SQUAMOUS EPITHELIAL / LPF: NONE SEEN

## 2015-06-18 LAB — DIFFERENTIAL
Basophils Absolute: 0 10*3/uL (ref 0–0.1)
Basophils Relative: 1 %
EOS ABS: 0.1 10*3/uL (ref 0–0.7)
EOS PCT: 2 %
LYMPHS ABS: 0.8 10*3/uL — AB (ref 1.0–3.6)
Lymphocytes Relative: 13 %
MONO ABS: 0.5 10*3/uL (ref 0.2–1.0)
Monocytes Relative: 8 %
NEUTROS PCT: 76 %
Neutro Abs: 4.3 10*3/uL (ref 1.4–6.5)

## 2015-06-18 LAB — BASIC METABOLIC PANEL
Anion gap: 5 (ref 5–15)
BUN: 14 mg/dL (ref 6–20)
CALCIUM: 9.4 mg/dL (ref 8.9–10.3)
CO2: 32 mmol/L (ref 22–32)
CREATININE: 0.84 mg/dL (ref 0.61–1.24)
Chloride: 104 mmol/L (ref 101–111)
GFR calc non Af Amer: >60 mL/min (ref >60)
Glucose, Bld: 82 mg/dL (ref 65–99)
Potassium: 4.2 mmol/L (ref 3.5–5.1)
Sodium: 141 mmol/L (ref 135–145)

## 2015-06-18 LAB — PROTIME-INR
INR: 1.26
Prothrombin Time: 15.9 seconds — ABNORMAL HIGH (ref 11.4–15.0)

## 2015-06-18 LAB — CBC
HCT: 41.9 % (ref 40.0–52.0)
Hemoglobin: 13.9 g/dL (ref 13.0–18.0)
MCH: 32.5 pg (ref 26.0–34.0)
MCHC: 33.1 g/dL (ref 32.0–36.0)
MCV: 98.3 fL (ref 80.0–100.0)
PLATELETS: 228 10*3/uL (ref 150–440)
RBC: 4.26 MIL/uL — ABNORMAL LOW (ref 4.40–5.90)
RDW: 13.8 % (ref 11.5–14.5)
WBC: 5.7 10*3/uL (ref 3.8–10.6)

## 2015-06-18 NOTE — Patient Instructions (Signed)
Your procedure is scheduled on: Friday 06/29/15 Report to Day Surgery. 2ND FLOOR MEDICAL MALL ENTRANCE To find out your arrival time please call [phone] between 1PM - 3PM on Thursday 06/28/15.  Remember: Instructions that are not followed completely may result in serious medical risk, up to and including death, or upon the discretion of your surgeon and anesthesiologist your surgery may need to be rescheduled.    __X__ 1. Do not eat food or drink liquids after midnight. No gum chewing or hard candies.     __X__ 2. No Alcohol for 24 hours before or after surgery.   ____ 3. Bring all medications with you on the day of surgery if instructed.    __X__ 4. Notify your doctor if there is any change in your medical condition     (cold, fever, infections).     Do not wear jewelry, make-up, hairpins, clips or nail polish.  Do not wear lotions, powders, or perfumes.   Do not shave 48 hours prior to surgery. Men may shave face and neck.  Do not bring valuables to the hospital.    [facility] is not responsible for any belongings or valuables.               Contacts, dentures or bridgework may not be worn into surgery.  Leave your suitcase in the car. After surgery it may be brought to your room.  For patients admitted to the hospital, discharge time is determined by your                treatment team.   Patients discharged the day of surgery will not be allowed to drive home.   Please read over the following fact sheets that you were given:   MRSA Information and Surgical Site Infection Prevention   __X__ Take these medicines the morning of surgery with A SIP OF WATER:    1. LISINOPRIL  2.   3.   4.  5.  6.  ____ Fleet Enema (as directed)   __X__ Use CHG Soap as directed  ____ Use inhalers on the day of surgery  ____ Stop metformin 2 days prior to surgery    ____ Take 1/2 of usual insulin dose the night before surgery and none on the morning of surgery.   __X__ Stop  Coumadin/Plavix/aspirin on CONSULT dR tHOMAS REGARDING ASPIRIN  __X__ Stop Anti-inflammatories on STOP NAPROXEN TODAY   ____ Stop supplements until after surgery.    __X__ Bring C-Pap to the hospital.

## 2015-07-13 ENCOUNTER — Ambulatory Visit
Admission: RE | Admit: 2015-07-13 | Discharge: 2015-07-13 | Disposition: A | Discharge status: Home / Self Care | Payer: Medicare HMO | Source: Ambulatory Visit | Attending: Cardiology | Admitting: Cardiology

## 2015-07-13 ENCOUNTER — Ambulatory Visit: Payer: Medicare HMO | Admitting: Anesthesiology

## 2015-07-13 ENCOUNTER — Encounter
Admission: RE | Disposition: A | Discharge status: Home / Self Care | Payer: Self-pay | Source: Ambulatory Visit | Attending: Cardiology

## 2015-07-13 ENCOUNTER — Encounter: Payer: Self-pay | Admitting: Anesthesiology

## 2015-07-13 DIAGNOSIS — I495 Sick sinus syndrome: Secondary | ICD-10-CM | POA: Diagnosis not present

## 2015-07-13 DIAGNOSIS — G4730 Sleep apnea, unspecified: Secondary | ICD-10-CM | POA: Diagnosis not present

## 2015-07-13 DIAGNOSIS — F0390 Unspecified dementia without behavioral disturbance: Secondary | ICD-10-CM | POA: Insufficient documentation

## 2015-07-13 DIAGNOSIS — I110 Hypertensive heart disease with heart failure: Secondary | ICD-10-CM | POA: Diagnosis not present

## 2015-07-13 DIAGNOSIS — I5022 Chronic systolic (congestive) heart failure: Secondary | ICD-10-CM | POA: Diagnosis present

## 2015-07-13 DIAGNOSIS — Z4502 Encounter for adjustment and management of automatic implantable cardiac defibrillator: Secondary | ICD-10-CM | POA: Insufficient documentation

## 2015-07-13 DIAGNOSIS — K7460 Unspecified cirrhosis of liver: Secondary | ICD-10-CM | POA: Diagnosis not present

## 2015-07-13 HISTORY — PX: IMPLANTABLE CARDIOVERTER DEFIBRILLATOR (ICD) GENERATOR CHANGE: SHX5469

## 2015-07-13 SURGERY — ICD GENERATOR CHANGE
Anesthesia: Monitor Anesthesia Care | Site: Chest | Laterality: Left | Wound class: Clean

## 2015-07-13 MED ORDER — VANCOMYCIN HCL IN DEXTROSE 1-5 GM/200ML-% IV SOLN
1000.0000 mg | Freq: Once | INTRAVENOUS | Status: AC
  Administered 2015-07-13: 1000 mg via INTRAVENOUS

## 2015-07-13 MED ORDER — GENTAMICIN SULFATE 40 MG/ML IJ SOLN
INTRAMUSCULAR | Status: DC | PRN
  Administered 2015-07-13: 14:00:00

## 2015-07-13 MED ORDER — LACTATED RINGERS IV SOLN
INTRAVENOUS | Status: DC
  Administered 2015-07-13: 13:00:00 via INTRAVENOUS

## 2015-07-13 MED ORDER — FENTANYL CITRATE (PF) 100 MCG/2ML IJ SOLN: 25.0000 ug | INTRAMUSCULAR | Status: DC | PRN

## 2015-07-13 MED ORDER — FAMOTIDINE 20 MG PO TABS
20.0000 mg | ORAL_TABLET | Freq: Once | ORAL | Status: AC
  Administered 2015-07-13: 20 mg via ORAL

## 2015-07-13 MED ORDER — ONDANSETRON HCL 4 MG/2ML IJ SOLN: 4.0000 mg | Freq: Once | INTRAMUSCULAR | Status: DC | PRN

## 2015-07-13 MED ORDER — LIDOCAINE HCL (CARDIAC) 20 MG/ML IV SOLN
INTRAVENOUS | Status: DC | PRN
  Administered 2015-07-13: 60 mg via INTRAVENOUS
  Administered 2015-07-13: 40 mg via INTRAVENOUS

## 2015-07-13 MED ORDER — HEPARIN SODIUM (PORCINE) 5000 UNIT/ML IJ SOLN
INTRAMUSCULAR | Status: AC
  Filled 2015-07-13: qty 1

## 2015-07-13 MED ORDER — GENTAMICIN SULFATE 40 MG/ML IJ SOLN
INTRAMUSCULAR | Status: AC
  Filled 2015-07-13: qty 2

## 2015-07-13 MED ORDER — FENTANYL CITRATE (PF) 100 MCG/2ML IJ SOLN
INTRAMUSCULAR | Status: DC | PRN
  Administered 2015-07-13 (×2): 25 ug via INTRAVENOUS

## 2015-07-13 MED ORDER — SODIUM CHLORIDE 0.9 % IR SOLN
Freq: Once | Status: DC
  Filled 2015-07-13: qty 2

## 2015-07-13 MED ORDER — PROPOFOL 500 MG/50ML IV EMUL
INTRAVENOUS | Status: DC | PRN
  Administered 2015-07-13: 40 ug/kg/min via INTRAVENOUS

## 2015-07-13 MED ORDER — LIDOCAINE HCL (PF) 1 % IJ SOLN
INTRAMUSCULAR | Status: AC
  Filled 2015-07-13: qty 30

## 2015-07-13 MED ORDER — LIDOCAINE HCL (PF) 1 % IJ SOLN
INTRAMUSCULAR | Status: DC | PRN
  Administered 2015-07-13: 17 mL

## 2015-07-13 SURGICAL SUPPLY — 47 items
BAG DECANTER FOR FLEXI CONT (MISCELLANEOUS) IMPLANT
BLADE SURG SZ10 CARB STEEL (BLADE) ×3 IMPLANT
CABLE SURG 12 DISP A/V CHANNEL (MISCELLANEOUS) ×3 IMPLANT
CANISTER SUCT 1200ML W/VALVE (MISCELLANEOUS) ×3 IMPLANT
CHLORAPREP W/TINT 26ML (MISCELLANEOUS) ×3 IMPLANT
CLOSURE WOUND 1/2 X4 (GAUZE/BANDAGES/DRESSINGS) ×1
COVER LIGHT HANDLE STERIS (MISCELLANEOUS) ×6 IMPLANT
DRAPE C-ARM XRAY 36X54 (DRAPES) IMPLANT
DRAPE CAMERA CLOSED 9X96 (DRAPES) IMPLANT
DRAPE INCISE 23X17 IOBAN STRL (DRAPES) ×2
DRAPE INCISE IOBAN 23X17 STRL (DRAPES) ×1 IMPLANT
DRAPE LAPAROTOMY 77X122 PED (DRAPES) ×3 IMPLANT
DRSG TEGADERM 4X4.75 (GAUZE/BANDAGES/DRESSINGS) ×3 IMPLANT
DRSG TEGADERM 6X8 (GAUZE/BANDAGES/DRESSINGS) ×3 IMPLANT
ELECT REM PT RETURN 9FT ADLT (ELECTROSURGICAL) ×3
ELECTRODE REM PT RTRN 9FT ADLT (ELECTROSURGICAL) ×1 IMPLANT
GLOVE BIO SURGEON STRL SZ7.5 (GLOVE) ×15 IMPLANT
GOWN STRL REUS W/ TWL LRG LVL3 (GOWN DISPOSABLE) ×1 IMPLANT
GOWN STRL REUS W/ TWL XL LVL3 (GOWN DISPOSABLE) ×2 IMPLANT
GOWN STRL REUS W/TWL LRG LVL3 (GOWN DISPOSABLE) ×2
GOWN STRL REUS W/TWL XL LVL3 (GOWN DISPOSABLE) ×4
GRADUATE 1200CC STRL 31836 (MISCELLANEOUS) IMPLANT
ICD EVERA XT MRI DF1  DDMB1D1 (ICD Generator) ×2 IMPLANT
ICD EVERA XT MRI DF1 DDMB1D1 (ICD Generator) ×1 IMPLANT
IV NS 1000ML (IV SOLUTION) ×2
IV NS 1000ML BAXH (IV SOLUTION) ×1 IMPLANT
KIT RM TURNOVER STRD PROC AR (KITS) ×3 IMPLANT
NEEDLE FILTER BLUNT 18X 1/2SAF (NEEDLE) ×2
NEEDLE FILTER BLUNT 18X1 1/2 (NEEDLE) ×1 IMPLANT
NEEDLE HYPO 25X1 1.5 SAFETY (NEEDLE) ×3 IMPLANT
NEEDLE SPNL 22GX3.5 QUINCKE BK (NEEDLE) IMPLANT
NS IRRIG 500ML POUR BTL (IV SOLUTION) ×3 IMPLANT
PACK BASIN MINOR ARMC (MISCELLANEOUS) ×3 IMPLANT
PACK PACE INSERTION (MISCELLANEOUS) ×3 IMPLANT
PAD STATPAD (MISCELLANEOUS) ×3 IMPLANT
STRIP CLOSURE SKIN 1/2X4 (GAUZE/BANDAGES/DRESSINGS) ×2 IMPLANT
SUT DVC V-LOC 4-0 90 CLR P-12 (SUTURE) ×6
SUT SILK 2 0 SH (SUTURE) IMPLANT
SUT VIC AB 2-0 CT1 27 (SUTURE)
SUT VIC AB 2-0 CT1 TAPERPNT 27 (SUTURE) IMPLANT
SUT VIC AB 2-0 CT2 27 (SUTURE) ×3 IMPLANT
SUT VIC AB 3-0 PS2 18 (SUTURE) ×3 IMPLANT
SUT VIC AB 4-0 PS2 18 (SUTURE) ×3 IMPLANT
SUTURE DVC V-LC4-0 90 CLR P-12 (SUTURE) ×2 IMPLANT
SYR BULB IRRIG 60ML STRL (SYRINGE) ×3 IMPLANT
SYR CONTROL 10ML (SYRINGE) ×3 IMPLANT
SYRINGE 10CC LL (SYRINGE) IMPLANT

## 2015-07-13 NOTE — Anesthesia Preprocedure Evaluation (Signed)
Anesthesia Evaluation  Patient identified by MRN, date of birth, ID band Patient awake    Reviewed: Allergy & Precautions, NPO status , Patient's Chart, lab work & pertinent test results, reviewed documented beta blocker date and time   Airway Mallampati: II  TM Distance: >3 FB     Dental  (+) Chipped   Pulmonary sleep apnea ,           Cardiovascular hypertension, Pt. on medications +CHF       Neuro/Psych    GI/Hepatic (+) Cirrhosis       ,   Endo/Other    Renal/GU      Musculoskeletal   Abdominal   Peds  Hematology   Anesthesia Other Findings   Reproductive/Obstetrics                             Anesthesia Physical Anesthesia Plan  ASA: III  Anesthesia Plan: MAC   Post-op Pain Management:    Induction:   Airway Management Planned:   Additional Equipment:   Intra-op Plan:   Post-operative Plan:   Informed Consent: I have reviewed the patients History and Physical, chart, labs and discussed the procedure including the risks, benefits and alternatives for the proposed anesthesia with the patient or authorized representative who has indicated his/her understanding and acceptance.     Plan Discussed with: CRNA  Anesthesia Plan Comments:         Anesthesia Quick Evaluation

## 2015-07-13 NOTE — Anesthesia Postprocedure Evaluation (Signed)
Anesthesia Post Note  Patient: Stephen Roth.  Procedure(s) Performed: Procedure(s) (LRB): ICD GENERATOR CHANGE (Left)  Patient location during evaluation: PACU Anesthesia Type: General Level of consciousness: awake Pain management: pain level controlled Vital Signs Assessment: post-procedure vital signs reviewed and stable Respiratory status: spontaneous breathing Cardiovascular status: stable Anesthetic complications: no    Last Vitals:  Filed Vitals:   07/13/15 1426 07/13/15 1427  BP: 108/76 108/76  Pulse:  57  Temp: 36.1 C   Resp: 62 12    Last Pain:  Filed Vitals:   07/13/15 1433  PainSc: 0-No pain                 VAN STAVEREN,Ebenezer Mccaskey

## 2015-07-13 NOTE — Op Note (Signed)
Preoperative diagnosis chronic systolic HF, SSS Postoperative diagnosis same  Procedure: Generator replacement; Assessment of HV leads    Following informed consent the patient was brought to the electrophysiology laboratory in place of the fluoroscopic table in the supine position after routine prep and drape lidocaine was infiltrated in the region of the previous incision and carried down to later the device pocket using sharp dissection and electrocautery. The pocket was opened the device was freed up and was explanted.  Interrogation of the previously implanted ICD ventricular lead  Normal RV and RA lead function   The leads were inspected. Repair was not needed. The leads were then attached to a medtronic pulse generator, serial number  evera MRI [id number].    Through the device the P-wave amplitude  Was  4.6 milllivolts and impedance of 443 ohms, and a pacing threshold of  0.4 volts at @ 0.40milliseconds; the ICD ventricular lead demonstrated an R wave of  4.3  millivolts., and impedance of 380 ohms, and a pacing threshold of  0.5 volts at 0.4 msec    High voltage impedances were 39 ohms  The pocket was irrigated with antibiotic containing saline solution hemostasis was assured and the leads and the device were placed in the pocket. The wound was then closed in 3 layers in normal fashion.  The patient tolerated the procedure without apparent complication.  DFT testing was not performed  EBL Minimal   No complications needed.  Summary of Implanted hardware: MDT Gen evera mri RV lead 6947 Atrial lead MDT 5076  Programmed parameters:AAI DDD 60 UTR 120 VT monitor 162 VF 200 30/40

## 2015-07-13 NOTE — H&P (Signed)
Patient Identification: Stephen Babiarz. is a 82 y.o. male here for cardiac device management.  History: Stephen Roth. is a 82 y.o. male with a history of symptomatic bradycardia chronic systolic heart failure and dementia. He had an ICD placed in 05/09/07 for a low ejection fraction and symptomatic bradycardia. He denies any prior shocks. He is doing well from a cardiovascular standpoint. He has had some recent general medical issues related to an eye infection and some worsening dementia however he appears quite lucid and appropriate today is alert and oriented 3. He is accompanied by his wife and his daughter. His device has now reached elective replacement indicator.  He denies chest pain, chest pressure/discomfort, claudication, dyspnea, exertional chest pressure/discomfort, irregular heart beat, lower extremity edema, near-syncope, palpitations, paroxysmal nocturnal dyspnea, syncope, tachypnea and Weight gain  Problem List:  Problem List Date Reviewed: 05/09/15  Codes Priority Class Noted - Resolved  Infection of eyelid ICD-10-CM: H01.9 ICD-9-CM: 373.9 03/23/2015 - Present  Adverse drug reaction, initial encounter ICD-10-CM: T88.7XXA ICD-9-CM: E947.9 03/23/2015 - Present  Personal history of other malignant neoplasm of skin ICD-10-CM: Z85.828 ICD-9-CM: V10.83 02/26/2015 - Present  NPH (normal pressure hydrocephalus) ICD-10-CM: G91.2 ICD-9-CM: 331.5 01/17/2015 - Present  Mild dementia ICD-10-CM: F03.90 ICD-9-CM: 294.20 01/10/2015 - Present  Overview Signed 01/10/2015 2:19 PM by Cheryll Cockayne, MD  Evaluated by Neurology 12/16; Aricept started.   BPH with elevated PSA ICD-10-CM: N40.0, R97.20 ICD-9-CM: 600.00, 790.93 Unknown - Present  Arthritis ICD-10-CM: M19.90 ICD-9-CM: 716.90 Unknown - Present  Sleep apnea ICD-10-CM: G47.30 ICD-9-CM: 780.57 Unknown - Present  Overview Signed 06/28/2013 11:55 PM by Cheryll Cockayne, MD  cpap; followed by Dr.  Raul Del   Hypertension ICD-10-CM: I10 ICD-9-CM: 401.9 Unknown - Present  Cirrhosis (CMS-HCC) ICD-10-CM: [id number] ICD-9-CM: 571.5 Unknown - Present  Overview Signed 01/10/2015 2:16 PM by Cheryll Cockayne, MD  Incidentally noted on CT. Hepatitis serology negative. May be due to hepatic congestion due to CHF.   Esophageal reflux ICD-10-CM: K21.9 ICD-9-CM: 530.81 09/23/2012 - Present  Automatic implantable cardioverter-defibrillator in situ ICD-10-CM: Z95.810 ICD-9-CM: V45.02 04/01/2012 - Present  Overview Signed 04/01/2012 3:57 PM by Ilda Basset, RN  Medtronic Secura ICD   Anxiety, unspecified ICD-10-CM: F41.9 ICD-9-CM: 300.00 04/01/2012 - Present  Hyperglycemia ICD-10-CM: R73.9 ICD-9-CM: 790.29 04/01/2012 - Present  Congestive heart failure (CMS-HCC) ICD-10-CM: I50.9 ICD-9-CM: 428.0 04/01/2012 - Present  Overview Signed 12/01/2013 10:32 AM by Ok Edwards, NP  Overview:  Overview:  With leg edema. EF 30 %   RESOLVED: Other abnormal glucose ICD-10-CM: R73.09 ICD-9-CM: 790.29 Unknown - 01/04/2014  RESOLVED: BPH (benign prostatic hypertrophy) ICD-10-CM: N40.0 ICD-9-CM: 600.00 04/01/2012 - 01/04/2014  RESOLVED: CHF (congestive heart failure) (CMS-HCC) ICD-10-CM: I50.9 ICD-9-CM: 428.0 04/01/2012 - 01/04/2014  Overview Signed 04/01/2012 3:58 PM by Ilda Basset, RN  With leg edema. EF 30 %   RESOLVED: H/O sleep apnea ICD-10-CM: Z86.69 ICD-9-CM: V13.89 04/01/2012 - 01/04/2014  Overview Signed 04/01/2012 3:58 PM by Ilda Basset, RN  On CPAP   RESOLVED: Benign enlargement of prostate ICD-10-CM: N40.0 ICD-9-CM: 600.00 04/01/2012 - 01/04/2014  RESOLVED: Automatic implantable cardioverter-defibrillator in situ ICD-10-CM: Z95.810 ICD-9-CM: V45.02 05/31/2007 - 01/04/2014  Overview Signed 12/01/2013 10:32 AM by Ok Edwards, NP  Overview:  Overview:  Medtronic Secura ICD     Medications: Current Outpatient Prescriptions  Medication Sig Dispense Refill  .  cholecalciferol, vitamin D3, (VITAMIN D3) 5,000 unit Tab Take by mouth.  . clonazePAM (KLONOPIN) 0.5 MG tablet TAKE 1 TABLET  BY MOUTH AT BEDTIME 90 tablet 1  . cyanocobalamin (VITAMIN B12) 1000 MCG tablet Take 500 mcg by mouth once daily.   Marland Kitchen docusate (COLACE) 100 MG capsule Take 100 mg by mouth 2 (two) times daily.  Marland Kitchen dutasteride (AVODART) 0.5 mg capsule Take 0.5 mg by mouth daily.  . folic acid (FOLVITE) A999333 MCG tablet Take 400 mcg by mouth daily.  Marland Kitchen lisinopril (PRINIVIL,ZESTRIL) 2.5 MG tablet TAKE 1 TABLET BY MOUTH EVERY DAY 90 tablet 3  . LUTEIN ORAL Take by mouth once daily.  . melatonin 5 mg Tab Take 1 tablet by mouth nightly.  . multivitamin tablet Take 1 tablet by mouth daily.  . sennosides-docusate (SENOKOT-S) 8.6-50 mg tablet Take 2 tablets by mouth 2 (two) times daily. 120 tablet 11  . triamcinolone 0.5 % cream Apply topically 2 (two) times daily as needed. 30 g 3  . walker Misc One walker with seat and wheels. 1 each 0   No current facility-administered medications for this visit.   An updated, reconciled list of all medications, including OTC and herbal, was reviewed and a copy of the updated list was provided to the patient. Treatment plan and medications were discussed with the patient who voiced understanding. No barriers to learning.  Allergies: Allergies as of 04/13/2015 Elta Guadeloupe as Reviewed 04/13/2015  Allergen Reaction Noted  . Clindamycin Diarrhea 03/22/2015  . Keflex [cephalexin] Rash 03/23/2015  . Morphine Other (See Comments) 02/26/2015   Social History: Social History   Social History  . Marital status: Married  Spouse name: N/A  . Number of children: N/A  . Years of education: N/A   Occupational History  . Not on file.   Social History Main Topics  . Smoking status: Never Smoker  . Smokeless tobacco: Never Used  . Alcohol use No  . Drug use: No  . Sexual activity: Defer   Other Topics Concern  . Not on file   Social History Narrative   Family  History: Family History  Problem Relation Age of Onset  . Heart failure Father  . Benign prostatic hyperplasia  . Diverticulosis  . Heart failure  . Sleep apnea  . Hypertension  . Anxiety disorder  . Diabetes type II Sister   ROS: General: negative for fatigue, fever or night sweats Respiratory: no cough, shortness of breath, or wheezing Cardiovascular: no chest pain or dyspnea on exertion Gastrointestinal: no abdominal pain, change in bowel habits, or black or bloody stools Musculoskeletal: negative for joint pain or muscular weakness Neurological: no TIA or stroke symptoms  Review of systems otherwise negative except as indicated above in the history.  Physical Exam:  Vitals: Visit Vitals  . BP 120/68 (BP Location: Left upper arm, Patient Position: Sitting, BP Cuff Size: Small Adult)  . Pulse 65  . Ht 174 cm (5' 8.5")  . Wt 77.6 kg (171 lb)  . SpO2 99%  . BMI 25.62 kg/m2   BMI: Body mass index is 25.62 kg/(m^2).  General Appearance: Appears well, no distress; Alert and oriented x 3  Neck: Carotids 2+ bilaterally, no carotid bruits, JVD is flat  Lungs: clear to auscultation, without rales or wheeze, good air exchange  Heart: normal rate and regular rhythm  Abdomen: Soft, non-tender, bowel sounds active, no masses, no organomegaly  Extremities: Extremities normal, no cyanosis. no edema Bilateral  Skin: No rashes or ulcers. Device is located on the left infra-clavicular region.  Neurologic: Alert, interactive, and appropriate, grossly moving all 4 extremities   Device  information and interrogation SUMMARY OF IMPLANTED HARDWARE: Medtronic Secura DR, model number [id number], DRG dual-chamber ICD (serial number is [id number]) with a Medtronic Sprint Quattro Secure, model number is [id number] right ventricular lead (serial numbers [id number])and a Medtronic [id number] atrial lead (serial [id number]).  All implanted at 05/31/2007.  Labs: Lab Results  Component Value Date   WBC 6.1 02/05/2015  HGB 14.6 02/05/2015  HCT 44.4 02/05/2015  MCV 100 (H) 02/05/2015  PLT 253 02/05/2015   Lab Results  Component Value Date  CREATININE 1.0 02/05/2015  BUN 14 02/05/2015  NA 145 02/05/2015  K 3.9 02/05/2015  CL 108 02/05/2015  CO2 29 02/05/2015   Lab Results  Component Value Date  ALT 24 01/03/2015  AST 30 01/03/2015  ALKPHOS 58 01/03/2015   Lab Results  Component Value Date  INR 1.1 02/05/2015  INR 1.1 (L) 07/05/2014  INR 1.2 (L) 12/28/2013   Assessment and plan: 1). Chronic systolic heart failure-patient is euvolemic on today's exam-he is on lisinopril and is not on a blocker as he has been intolerant of this in the past. 2) sick sinus syndrome-patient indication for pacemaker component of his defibrillator previously was syncope and symptomatic bradycardia he is atrial pacing 30% of the time however his symptoms improved significantly after device implantation will plan to continue atrial pacing as needed with program heart rate in the 60s with his new device implantation 3) ICD at elective replacement indicator-he has no normal functioning device and leads. We had a long discussion regarding his preferences for defibrillator implantation versus pacemaker generator replacement without defibrillator capabilities. The patient was clear and his decision was supported by his family that he would like to continue with defibrillation capabilities of his device. We discussed the pros and cons and I feel comfortable that the patient is making an informed decision. ICD BOOKING PROCEDURE (BASE CPT CODE)  No 33249 Insert ICD system, single or dual transvenous leads  No 33262 Remove and insert ICD generator; single lead  Yes 33263 Remove and insert ICD generator; dual lead  No 33264 Remove and insert ICD generator; multiple lead  No 33225 Insert LV lead at initial implant  Yes 93641-26 EP evaluation of leads/generator at implant  No 93642 EP evaluation of ICD not at  implant  No 33215 Reposition RA or RV pacing lead  No 33224 Insert LV lead not at initial implant  No 33226 Reposition LV lead  Yes [id number] Revise skin pocket for ICD   ADDITIONAL PLAN: no  PROCEDURE DATE: 05/2015  PREPROCEDURAL DIAGNOSIS: Symptomatic bradycardia, chronic systolic heart failure, ICD at ERI  EF: LVEF 30% based on last assessment years ago  NYHA Class: II  ADMISSION STATUS Outpatient  PROCEDURE STATUS elective  REFERRING MD Sydnee Levans, Md [address] [place], [place] [address]  DEVICE VENDOR Medtronic  PLANNED SIDE left  PREPROCEDURAL ANTIOBIOTICS Clindamycin 900 mg Vancomycin 1 g  Follow-up: With his primary cardiologist 2 week wound check in 3 months post device check  I confirm that I was present for the key and critical portions of the service, including a review of the patient's history and laboratory data. I personally examined the patient, and formulated the evaluation and/or treatment plan.

## 2015-07-13 NOTE — Transfer of Care (Signed)
Immediate Anesthesia Transfer of Care Note  Patient: Stephen Roth.  Procedure(s) Performed: Procedure(s): ICD GENERATOR CHANGE (Left)  Patient Location: PACU  Anesthesia Type:MAC  Level of Consciousness: awake, alert  and oriented  Airway & Oxygen Therapy: Patient Spontanous Breathing and Patient connected to face mask oxygen  Post-op Assessment: Report given to RN and Post -op Vital signs reviewed and stable  Post vital signs: Reviewed and stable  Last Vitals:  Filed Vitals:   07/13/15 1153  BP: 97/65  Pulse: 64  Temp: 36.6 C  Resp: 16    Last Pain:  Filed Vitals:   07/13/15 1156  PainSc: 0-No pain         Complications: No apparent anesthesia complications

## 2015-07-16 ENCOUNTER — Encounter: Payer: Self-pay | Admitting: Cardiology

## 2015-08-10 ENCOUNTER — Observation Stay
Admission: EM | Admit: 2015-08-10 | Discharge: 2015-08-11 | Disposition: A | Discharge status: Home / Self Care | Payer: Medicare HMO | Attending: Specialist | Admitting: Specialist

## 2015-08-10 ENCOUNTER — Encounter: Payer: Self-pay | Admitting: Emergency Medicine

## 2015-08-10 DIAGNOSIS — Z818 Family history of other mental and behavioral disorders: Secondary | ICD-10-CM | POA: Insufficient documentation

## 2015-08-10 DIAGNOSIS — Z8249 Family history of ischemic heart disease and other diseases of the circulatory system: Secondary | ICD-10-CM | POA: Insufficient documentation

## 2015-08-10 DIAGNOSIS — Z885 Allergy status to narcotic agent status: Secondary | ICD-10-CM | POA: Insufficient documentation

## 2015-08-10 DIAGNOSIS — E785 Hyperlipidemia, unspecified: Secondary | ICD-10-CM | POA: Diagnosis not present

## 2015-08-10 DIAGNOSIS — F0390 Unspecified dementia without behavioral disturbance: Secondary | ICD-10-CM | POA: Diagnosis not present

## 2015-08-10 DIAGNOSIS — I110 Hypertensive heart disease with heart failure: Secondary | ICD-10-CM | POA: Insufficient documentation

## 2015-08-10 DIAGNOSIS — N400 Enlarged prostate without lower urinary tract symptoms: Secondary | ICD-10-CM | POA: Insufficient documentation

## 2015-08-10 DIAGNOSIS — Z79899 Other long term (current) drug therapy: Secondary | ICD-10-CM | POA: Insufficient documentation

## 2015-08-10 DIAGNOSIS — D62 Acute posthemorrhagic anemia: Secondary | ICD-10-CM | POA: Insufficient documentation

## 2015-08-10 DIAGNOSIS — Z85828 Personal history of other malignant neoplasm of skin: Secondary | ICD-10-CM | POA: Diagnosis not present

## 2015-08-10 DIAGNOSIS — I509 Heart failure, unspecified: Secondary | ICD-10-CM | POA: Insufficient documentation

## 2015-08-10 DIAGNOSIS — G912 (Idiopathic) normal pressure hydrocephalus: Secondary | ICD-10-CM | POA: Insufficient documentation

## 2015-08-10 DIAGNOSIS — Z95810 Presence of automatic (implantable) cardiac defibrillator: Secondary | ICD-10-CM | POA: Insufficient documentation

## 2015-08-10 DIAGNOSIS — Z881 Allergy status to other antibiotic agents status: Secondary | ICD-10-CM | POA: Insufficient documentation

## 2015-08-10 DIAGNOSIS — Z888 Allergy status to other drugs, medicaments and biological substances status: Secondary | ICD-10-CM | POA: Insufficient documentation

## 2015-08-10 DIAGNOSIS — K922 Gastrointestinal hemorrhage, unspecified: Principal | ICD-10-CM | POA: Insufficient documentation

## 2015-08-10 DIAGNOSIS — G4733 Obstructive sleep apnea (adult) (pediatric): Secondary | ICD-10-CM | POA: Insufficient documentation

## 2015-08-10 DIAGNOSIS — Z96653 Presence of artificial knee joint, bilateral: Secondary | ICD-10-CM | POA: Diagnosis not present

## 2015-08-10 DIAGNOSIS — K7460 Unspecified cirrhosis of liver: Secondary | ICD-10-CM | POA: Insufficient documentation

## 2015-08-10 DIAGNOSIS — Z7982 Long term (current) use of aspirin: Secondary | ICD-10-CM | POA: Diagnosis not present

## 2015-08-10 LAB — COMPREHENSIVE METABOLIC PANEL
ALBUMIN: 3.8 g/dL (ref 3.5–5.0)
ALT: 20 U/L (ref 17–63)
ANION GAP: 6 (ref 5–15)
AST: 26 U/L (ref 15–41)
Alkaline Phosphatase: 50 U/L (ref 38–126)
BUN: 24 mg/dL — ABNORMAL HIGH (ref 6–20)
CALCIUM: 9 mg/dL (ref 8.9–10.3)
CO2: 29 mmol/L (ref 22–32)
CREATININE: 0.82 mg/dL (ref 0.61–1.24)
Chloride: 103 mmol/L (ref 101–111)
GFR calc Af Amer: >60 mL/min (ref >60)
GFR calc non Af Amer: >60 mL/min (ref >60)
GLUCOSE: 104 mg/dL — AB (ref 65–99)
Potassium: 4.1 mmol/L (ref 3.5–5.1)
SODIUM: 138 mmol/L (ref 135–145)
TOTAL PROTEIN: 6 g/dL — AB (ref 6.5–8.1)
Total Bilirubin: 1 mg/dL (ref 0.3–1.2)

## 2015-08-10 LAB — CBC
HCT: 29 % — ABNORMAL LOW (ref 40.0–52.0)
HEMOGLOBIN: 10.1 g/dL — AB (ref 13.0–18.0)
MCH: 33.5 pg (ref 26.0–34.0)
MCHC: 35 g/dL (ref 32.0–36.0)
MCV: 95.7 fL (ref 80.0–100.0)
PLATELETS: 232 10*3/uL (ref 150–440)
RBC: 3.03 MIL/uL — AB (ref 4.40–5.90)
RDW: 13.7 % (ref 11.5–14.5)
WBC: 6.5 10*3/uL (ref 3.8–10.6)

## 2015-08-10 LAB — TYPE AND SCREEN
ABO/RH(D): O POS
ANTIBODY SCREEN: NEGATIVE

## 2015-08-10 LAB — PROTIME-INR
INR: 1.19
Prothrombin Time: 15.2 seconds (ref 11.4–15.2)

## 2015-08-10 LAB — HEMOGLOBIN: Hemoglobin: 8.8 g/dL — ABNORMAL LOW (ref 13.0–18.0)

## 2015-08-10 MED ORDER — ADULT MULTIVITAMIN W/MINERALS CH
1.0000 | ORAL_TABLET | Freq: Every day | ORAL | Status: DC
Start: 2015-08-10 — End: 2015-08-11
  Administered 2015-08-11: 1 via ORAL
  Filled 2015-08-10: qty 1

## 2015-08-10 MED ORDER — FOLIC ACID 1 MG PO TABS
0.5000 mg | ORAL_TABLET | Freq: Every day | ORAL | Status: DC
  Administered 2015-08-11: 0.5 mg via ORAL
  Filled 2015-08-10: qty 1

## 2015-08-10 MED ORDER — ONDANSETRON HCL 4 MG/2ML IJ SOLN: 4.0000 mg | Freq: Four times a day (QID) | INTRAMUSCULAR | Status: DC | PRN

## 2015-08-10 MED ORDER — VITAMIN B-1 100 MG PO TABS
250.0000 mg | ORAL_TABLET | Freq: Every day | ORAL | Status: DC
  Administered 2015-08-10: 200 mg via ORAL
  Filled 2015-08-10 (×2): qty 1

## 2015-08-10 MED ORDER — MELATONIN 5 MG PO TABS
10.0000 mg | ORAL_TABLET | Freq: Every day | ORAL | Status: DC
  Administered 2015-08-10: 10 mg via ORAL
  Filled 2015-08-10: qty 2

## 2015-08-10 MED ORDER — PANTOPRAZOLE SODIUM 40 MG PO TBEC
40.0000 mg | DELAYED_RELEASE_TABLET | Freq: Once | ORAL | Status: AC
  Administered 2015-08-10: 40 mg via ORAL
  Filled 2015-08-10: qty 1

## 2015-08-10 MED ORDER — ACETAMINOPHEN 650 MG RE SUPP: 650.0000 mg | Freq: Four times a day (QID) | RECTAL | Status: DC | PRN

## 2015-08-10 MED ORDER — VITAMIN B-12 1000 MCG PO TABS
1000.0000 ug | ORAL_TABLET | Freq: Every day | ORAL | Status: DC
  Administered 2015-08-11: 1000 ug via ORAL
  Filled 2015-08-10: qty 1

## 2015-08-10 MED ORDER — ACETAMINOPHEN 325 MG PO TABS: 650.0000 mg | ORAL_TABLET | Freq: Four times a day (QID) | ORAL | Status: DC | PRN

## 2015-08-10 MED ORDER — SODIUM CHLORIDE 0.9 % IV BOLUS (SEPSIS)
1000.0000 mL | Freq: Once | INTRAVENOUS | Status: AC
  Administered 2015-08-10: 1000 mL via INTRAVENOUS

## 2015-08-10 MED ORDER — DONEPEZIL HCL 5 MG PO TABS
5.0000 mg | ORAL_TABLET | Freq: Every day | ORAL | Status: DC
  Administered 2015-08-10: 5 mg via ORAL
  Filled 2015-08-10: qty 1

## 2015-08-10 MED ORDER — LISINOPRIL 5 MG PO TABS
2.5000 mg | ORAL_TABLET | Freq: Every day | ORAL | Status: DC
  Administered 2015-08-11: 2.5 mg via ORAL
  Filled 2015-08-10: qty 1

## 2015-08-10 MED ORDER — ONDANSETRON HCL 4 MG PO TABS: 4.0000 mg | ORAL_TABLET | Freq: Four times a day (QID) | ORAL | Status: DC | PRN

## 2015-08-10 MED ORDER — FOLIC ACID 400 MCG PO TABS: 400.0000 ug | ORAL_TABLET | Freq: Every day | ORAL | Status: DC

## 2015-08-10 MED ORDER — PNEUMOCOCCAL VAC POLYVALENT 25 MCG/0.5ML IJ INJ: 0.5000 mL | INJECTION | INTRAMUSCULAR | Status: DC

## 2015-08-10 MED ORDER — MELATONIN 10 MG PO TABS: 10.0000 mg | ORAL_TABLET | Freq: Every day | ORAL | Status: DC

## 2015-08-10 MED ORDER — DUTASTERIDE 0.5 MG PO CAPS
0.5000 mg | ORAL_CAPSULE | Freq: Every day | ORAL | Status: DC
  Administered 2015-08-11: 0.5 mg via ORAL
  Filled 2015-08-10 (×3): qty 1

## 2015-08-10 NOTE — H&P (Signed)
[facility] [place] NAME: Stephen Roth    MR#:  [id number]  DATE OF BIRTH:  07/14/1925  DATE OF ADMISSION:  08/10/2015  PRIMARY CARE PHYSICIAN: Adin Hector, MD   REQUESTING/REFERRING PHYSICIAN: Dr. Shirlyn Goltz  CHIEF COMPLAINT:   Chief Complaint  Patient presents with  . Rectal Bleeding    HISTORY OF PRESENT ILLNESS:  Stephen Roth  is a 82 y.o. male with a known history of Liver cirrhosis, essential hypertension, normal pressure hydrocephalus, obstructive sleep apnea, history of CHF, history of skin cancer presents to the hospital due to multiple episodes of rectal bleeding that began 3-4 days ago. Patient says that each day since then his rectal bleeding is actually improved. He does have some brown stool mixed with his rectal bleeding. He denies any abdominal pain, nausea vomiting, chills, chest pain, shortness of breath, palpitations or any other associated symptoms with his rectal bleeding. Since he continued to have bleeding he came to the ER for further evaluation. His hemoglobin has dropped from his previous at baseline 13 down to 10. He is on a baby aspirin every day. Hospitalist services were contacted further treatment and evaluation.  PAST MEDICAL HISTORY:   Past Medical History:  Diagnosis Date  . Cancer ([name])    basal cell on eyelid  . CHF (congestive heart failure) ([place])   . Cirrhosis ([place])   . Hypertension   . Normal pressure hydrocephalus   . Sleep apnea     PAST SURGICAL HISTORY:   Past Surgical History:  Procedure Laterality Date  . HERNIA REPAIR    . IMPLANTABLE CARDIOVERTER DEFIBRILLATOR (ICD) GENERATOR CHANGE Left 07/13/2015   Procedure: ICD GENERATOR CHANGE;  Surgeon: Marzetta Board, MD;  Location: [facility] ORS;  Service: Cardiovascular;  Laterality: Left;  . JOINT REPLACEMENT Bilateral    knees  . PACEMAKER INSERTION  2006   Pace maker, defibulator    SOCIAL HISTORY:   Social History  Substance Use  Topics  . Smoking status: Never Smoker  . Smokeless tobacco: Never Used  . Alcohol use No    FAMILY HISTORY:   Family History  Problem Relation Age of Onset  . Heart disease Father   . Bipolar disorder Brother     DRUG ALLERGIES:   Allergies  Allergen Reactions  . Cephalexin Other (See Comments)    Confusion, hives, hallucinatons  . Clindamycin/Lincomycin Other (See Comments)    Confusion, hives, hallucinations  . Morphine And Related Other (See Comments)    REVIEW OF SYSTEMS:   Review of Systems  Constitutional: Negative for fever and weight loss.  HENT: Negative for congestion, nosebleeds and tinnitus.   Eyes: Negative for blurred vision, double vision and redness.  Respiratory: Negative for cough, hemoptysis and shortness of breath.   Cardiovascular: Negative for chest pain, orthopnea, leg swelling and PND.  Gastrointestinal: Positive for blood in stool. Negative for abdominal pain, diarrhea, melena, nausea and vomiting.  Genitourinary: Negative for dysuria, hematuria and urgency.  Musculoskeletal: Negative for falls and joint pain.  Neurological: Negative for dizziness, tingling, sensory change, focal weakness, seizures, weakness and headaches.  Endo/Heme/Allergies: Negative for polydipsia. Does not bruise/bleed easily.  Psychiatric/Behavioral: Negative for depression and memory loss. The patient is not nervous/anxious.     MEDICATIONS AT HOME:   Prior to Admission medications   Medication Sig Start Date End Date Taking? Authorizing Provider  aspirin EC 81 MG tablet Take 81 mg by mouth daily.   Yes Historical  Provider, MD  donepezil (ARICEPT) 5 MG tablet Take 5 mg by mouth at bedtime.   Yes Historical Provider, MD  dutasteride (AVODART) 0.5 MG capsule Take 0.5 mg by mouth daily.   Yes Historical Provider, MD  lisinopril (PRINIVIL,ZESTRIL) 2.5 MG tablet Take 2.5 mg by mouth daily.   Yes Historical Provider, MD  Cholecalciferol (VITAMIN D-3 PO) Take 5,000 Units by  mouth every evening.    Historical Provider, MD  cyanocobalamin 1000 MCG tablet Take 1,000 mcg by mouth daily.     Historical Provider, MD  Docusate Calcium (STOOL SOFTENER PO) Take 1 tablet by mouth 2 (two) times daily.    Historical Provider, MD  folic acid (FOLVITE) A999333 MCG tablet Take 400 mcg by mouth daily.    Historical Provider, MD  Magnesium 500 MG CAPS Take 500 mg by mouth every evening.     Historical Provider, MD  Melatonin 10 MG TABS Take 10 mg by mouth at bedtime.    Historical Provider, MD  Misc Natural Products (LUTEIN 20 PO) Take 1 capsule by mouth every evening.    Historical Provider, MD  Multiple Vitamins-Minerals (CENTRUM SILVER PO) Take 1 tablet by mouth daily.    Historical Provider, MD  naproxen sodium (ANAPROX) 220 MG tablet Take 220 mg by mouth as needed (pain).     Historical Provider, MD  Thiamine HCl (VITAMIN B-1) 250 MG tablet Take 250 mg by mouth at bedtime.    Historical Provider, MD      VITAL SIGNS:  Blood pressure 103/62, pulse 67, temperature 98.3 F (36.8 C), temperature source Oral, resp. rate 18, height 5\' 8"  (1.727 m), weight 75.8 kg (167 lb), SpO2 99 %.  PHYSICAL EXAMINATION:  Physical Exam  GENERAL:  82 y.o.-year-old patient lying in the bed in no acute distress.  EYES: Pupils equal, round, reactive to light and accommodation. No scleral icterus. Extraocular muscles intact.  HEENT: Head atraumatic, normocephalic. Oropharynx and nasopharynx clear. No oropharyngeal erythema, moist oral mucosa  NECK:  Supple, no jugular venous distention. No thyroid enlargement, no tenderness.  LUNGS: Normal breath sounds bilaterally, no wheezing, rales, rhonchi. No use of accessory muscles of respiration.  CARDIOVASCULAR: S1, S2 RRR. No murmurs, rubs, gallops, clicks. + AICD in place.  ABDOMEN: Soft, nontender, nondistended. Bowel sounds present. No organomegaly or mass.  EXTREMITIES: No pedal edema, cyanosis, or clubbing. + 2 pedal & radial pulses b/l.   NEUROLOGIC:  Cranial nerves II through XII are intact. No focal Motor or sensory deficits appreciated b/l PSYCHIATRIC: The patient is alert and oriented x 3. Good affect.  SKIN: No obvious rash, lesion, or ulcer.   LABORATORY PANEL:   CBC  Recent Labs Lab 08/10/15 1618  WBC 6.5  HGB 10.1*  HCT 29.0*  PLT 232   ------------------------------------------------------------------------------------------------------------------  Chemistries   Recent Labs Lab 08/10/15 1618  NA 138  K 4.1  CL 103  CO2 29  GLUCOSE 104*  BUN 24*  CREATININE 0.82  CALCIUM 9.0  AST 26  ALT 20  ALKPHOS 50  BILITOT 1.0   ------------------------------------------------------------------------------------------------------------------  Cardiac Enzymes No results for input(s): TROPONINI in the last 168 hours. ------------------------------------------------------------------------------------------------------------------  RADIOLOGY:  No results found.   IMPRESSION AND PLAN:   82 year old male with past medical history of dementia, hypertension, hyperlipidemia, history of CHF, cirrhosis cryptogenic in nature, obstructive sleep apnea, skin cancer who presents to the hospital due to multiple episodes of rectal bleeding.  1. GI bleed-this is a lower GI bleed given the patient's rectal bleeding.  I suspect this is likely diverticular in nature. -His hemoglobin has dropped from his baseline 13-10. He is clinically hemodynamically stable. -I'll observe him overnight, follow serial hemoglobin. Place him on clear liquid diet. Transfuse if needed. Hold aspirin for now.  2. Acute blood loss anemia-secondary to GI bleed as mentioned. -Follow serial hemoglobins. Transfuse if hemoglobin falls less than 7. -Hold aspirin for now.  3. BPH-continue Avodart.  4. Essential hypertension-continue lisinopril.  5. Dementia-continue Aricept.    All the records are reviewed and case discussed with ED  provider. Management plans discussed with the patient, family and they are in agreement.  CODE STATUS: Full Code  TOTAL TIME TAKING CARE OF THIS PATIENT: 40 minutes.    Henreitta Leber M.D on 08/10/2015 at 7:37 PM  Between 7am to 6pm - Pager - [phone]  After 6pm go to [email or web address] - password [email or web address] [facility]  [facility]  Office  [phone]  CC: Primary care physician; Adin Hector, MD

## 2015-08-10 NOTE — ED Triage Notes (Signed)
Pt reports bright red rectal bleeding for 5 days. Sent for low hbg (9.5). Baseline hgb appears to be 14. Pt mildly hypotensive in triage. From previous office visits bp runs [id number] systolic. Has been weak.

## 2015-08-10 NOTE — ED Notes (Signed)
Pt reports seeing blood clot and bright red blood in his stool Tuesday morning. Pt has continued to have blood since

## 2015-08-10 NOTE — ED Provider Notes (Addendum)
[facility] Provider Note   CSN: [id number] Arrival date & time: 08/10/15  1524  First Provider Contact:  First MD Initiated Contact with Patient 08/10/15 1724        History   Chief Complaint Chief Complaint  Patient presents with  . Rectal Bleeding    HPI Stephen Malina. is a 82 y.o. male hx of CHF, cirrhosis, HTN, here with rectal bleeding. Patient has rectal bleeding for the last 5 days. It started when he ate some Spicy sausage about 5 days ago. Since then he's been having bright red blood per rectum about 4-5 episodes daily. He denies any abdominal pain. He states that every time he wipes he has some blood in his stool. Feels a little lightheaded dizzy but denies any chest pain. Patient went to control clinic today and had a hemoglobin of 10 with baseline 14 so sent for evaluation. On ASA 81 mg, not on blood thinners. Does take naprosyn.   The history is provided by the patient and the spouse.    Past Medical History:  Diagnosis Date  . Cancer ([place])    basal cell on eyelid  . CHF (congestive heart failure) ([place])   . Cirrhosis ([place])   . Hypertension   . Normal pressure hydrocephalus   . Sleep apnea     There are no active problems to display for this patient.   Past Surgical History:  Procedure Laterality Date  . HERNIA REPAIR    . IMPLANTABLE CARDIOVERTER DEFIBRILLATOR (ICD) GENERATOR CHANGE Left 07/13/2015   Procedure: ICD GENERATOR CHANGE;  Surgeon: Marzetta Board, MD;  Location: [facility] ORS;  Service: Cardiovascular;  Laterality: Left;  . JOINT REPLACEMENT Bilateral    knees  . PACEMAKER INSERTION  2006   Pace maker, defibulator       Home Medications    Prior to Admission medications   Medication Sig Start Date End Date Taking? Authorizing Provider  aspirin EC 81 MG tablet Take 81 mg by mouth daily.    Historical Provider, MD  Cholecalciferol (VITAMIN D-3 PO) Take 5,000 Units by mouth every evening.    Historical Provider, MD    cyanocobalamin 1000 MCG tablet Take 1,000 mcg by mouth daily.     Historical Provider, MD  Docusate Calcium (STOOL SOFTENER PO) Take 1 tablet by mouth 2 (two) times daily.    Historical Provider, MD  donepezil (ARICEPT) 5 MG tablet Take 5 mg by mouth at bedtime.    Historical Provider, MD  dutasteride (AVODART) 0.5 MG capsule Take 0.5 mg by mouth daily.    Historical Provider, MD  folic acid (FOLVITE) A999333 MCG tablet Take 400 mcg by mouth daily.    Historical Provider, MD  lisinopril (PRINIVIL,ZESTRIL) 2.5 MG tablet Take 2.5 mg by mouth daily.    Historical Provider, MD  Magnesium 500 MG CAPS Take 500 mg by mouth every evening.     Historical Provider, MD  Melatonin 10 MG TABS Take 10 mg by mouth at bedtime.    Historical Provider, MD  Misc Natural Products (LUTEIN 20 PO) Take 1 capsule by mouth every evening.    Historical Provider, MD  Multiple Vitamins-Minerals (CENTRUM SILVER PO) Take 1 tablet by mouth daily.    Historical Provider, MD  naproxen sodium (ANAPROX) 220 MG tablet Take 220 mg by mouth as needed (pain).     Historical Provider, MD  Thiamine HCl (VITAMIN B-1) 250 MG tablet Take 250 mg by mouth at bedtime.    Historical Provider,  MD    Family History History reviewed. No pertinent family history.  Social History Social History  Substance Use Topics  . Smoking status: Never Smoker  . Smokeless tobacco: Never Used  . Alcohol use No     Allergies   Cephalexin; Clindamycin/lincomycin; and Morphine and related   Review of Systems Review of Systems  Gastrointestinal: Positive for blood in stool.  All other systems reviewed and are negative.    Physical Exam Updated Vital Signs BP (!) 90/54   Pulse 78   Temp 98.3 F (36.8 C) (Oral)   Resp 18   Ht 5\' 8"  (1.727 m)   Wt 167 lb (75.8 kg)   SpO2 100%   BMI 25.39 kg/m   Physical Exam  Constitutional: He is oriented to person, place, and time. He appears well-developed and well-nourished.  HENT:  Head:  Normocephalic.  Mouth/Throat: Oropharynx is clear and moist.  Eyes: EOM are normal. Pupils are equal, round, and reactive to light.  Neck: Normal range of motion. Neck supple.  Cardiovascular: Normal rate and regular rhythm.   Pulmonary/Chest: Effort normal and breath sounds normal. He has no wheezes.  Abdominal: Soft. Bowel sounds are normal.  Genitourinary:  Genitourinary Comments: Rectal- no obvious hemorrhoids. Dark blood, no obvious melena   Musculoskeletal: Normal range of motion.  Neurological: He is alert and oriented to person, place, and time.  Skin: Skin is warm.  Psychiatric: He has a normal mood and affect.  Nursing note and vitals reviewed.    ED Treatments / Results  Labs (all labs ordered are listed, but only abnormal results are displayed) Labs Reviewed  COMPREHENSIVE METABOLIC PANEL - Abnormal; Notable for the following:       Result Value   Glucose, Bld 104 (*)    BUN 24 (*)    Total Protein 6.0 (*)    All other components within normal limits  CBC - Abnormal; Notable for the following:    RBC 3.03 (*)    Hemoglobin 10.1 (*)    HCT 29.0 (*)    All other components within normal limits  POC OCCULT BLOOD, ED  TYPE AND SCREEN    EKG  EKG Interpretation None      ED ECG REPORT I, YAO, DAVID, the attending physician, personally viewed and interpreted this ECG.   Date: 08/10/2015  EKG Time: 17:20 pm  Rate: 65  Rhythm: paced  Axis: normal  Intervals:none  ST&T Change: nonspecific    Radiology No results found.  Procedures Procedures (including critical care time)  Medications Ordered in ED Medications  sodium chloride 0.9 % bolus 1,000 mL (not administered)     Initial Impression / Assessment and Plan / ED Course  I have reviewed the triage vital signs and the nursing notes.  Pertinent labs & imaging results that were available during my care of the patient were reviewed by me and considered in my medical decision making (see chart for  details).  Clinical Course   Stephen Farnan. is a 82 y.o. male here with rectal bleeding for 5 days. Hypotensive 90/50 in the office. Hg repeat is 10.1, baseline 14. BP improved to 106/70 in the ED. Well appearing. Consulted Dr. Allen Norris from GI, who recommend admission for observation and transfusion as needed and trend serial CBCs. Will admit to hospitalist.    Final Clinical Impressions(s) / ED Diagnoses   Final diagnoses:  None    New Prescriptions New Prescriptions   No medications on file  Drenda Freeze, MD 08/10/15 1758    Drenda Freeze, MD 08/10/15 [id number]

## 2015-08-11 LAB — CBC
HCT: 24.2 % — ABNORMAL LOW (ref 40.0–52.0)
HEMOGLOBIN: 8.4 g/dL — AB (ref 13.0–18.0)
MCH: 33.3 pg (ref 26.0–34.0)
MCHC: 34.9 g/dL (ref 32.0–36.0)
MCV: 95.3 fL (ref 80.0–100.0)
PLATELETS: 176 10*3/uL (ref 150–440)
RBC: 2.54 MIL/uL — ABNORMAL LOW (ref 4.40–5.90)
RDW: 13.7 % (ref 11.5–14.5)
WBC: 4.3 10*3/uL (ref 3.8–10.6)

## 2015-08-11 LAB — HEMOGLOBIN: HEMOGLOBIN: 9.7 g/dL — AB (ref 13.0–18.0)

## 2015-08-11 NOTE — Progress Notes (Signed)
Pt to be discharged per MD order. Iv removed. Instructions reviewed with pt and family. No scripts ordered. Will takedown in wheelchair.

## 2015-08-11 NOTE — Care Management Obs Status (Signed)
[facility] NOTIFICATION   Patient Details  Name: Stephen Roth. MRN: [id number] Date of Birth: [birth year]   Medicare Observation Status Notification Given:  Yes    Carles Collet, RN 08/11/2015, 10:20 AM

## 2015-08-11 NOTE — Discharge Instructions (Signed)
Gastrointestinal Bleeding °Gastrointestinal bleeding is bleeding somewhere along the path that food travels through the body (digestive tract). This path is anywhere between the mouth and the opening of the butt (anus). You may have blood in your throw up (vomit) or in your poop (stools). If there is a lot of bleeding, you may need to stay in the hospital. °HOME CARE °· Only take medicine as told by your doctor. °· Eat foods with fiber such as whole grains, fruits, and vegetables. You can also try eating 1 to 3 prunes a day. °· Drink enough fluids to keep your pee (urine) clear or pale yellow. °GET HELP RIGHT AWAY IF:  °· Your bleeding gets worse. °· You feel dizzy, weak, or you pass out (faint). °· You have bad cramps in your back or belly (abdomen). °· You have large blood clumps (clots) in your poop. °· Your problems are getting worse. °MAKE SURE YOU:  °· Understand these instructions. °· Will watch your condition. °· Will get help right away if you are not doing well or get worse. °  °This information is not intended to replace advice given to you by your health care provider. Make sure you discuss any questions you have with your health care provider. °  °Document Released: 10/09/2007 Document Revised: 12/17/2011 Document Reviewed: 06/19/2014 °Elsevier Interactive Patient Education ©2016 [employer] ° °

## 2015-08-11 NOTE — Care Management Note (Signed)
Case Management Note  Patient Details  Name: Stephen Roth. MRN: [id number] Date of Birth: June 20, 1925  Subjective/Objective:                 Patient from home with wife, lives in [place]. Uses a cane, has a walker if needed as well. Denies needs for other DME. PCP is Dr Caryl Comes at [facility], uses [facility]. No CM needs identified.    Action/Plan:  DC to home today.  Expected Discharge Date:                  Expected Discharge Plan:  Home/Self Care  In-House Referral:     Discharge planning Services  CM Consult  Post Acute Care Choice:  NA Choice offered to:  NA  DME Arranged:  N/A DME Agency:  NA  HH Arranged:  NA HH Agency:  NA  Status of Service:  Completed, signed off  If discussed at [place] of Stay Meetings, dates discussed:    Additional Comments:  Carles Collet, RN 08/11/2015, 10:21 AM

## 2015-08-12 NOTE — Discharge Summary (Signed)
[facility] [place] NAME: Stephen Roth    MR#:  [id number]  DATE OF BIRTH:  1925-05-17  DATE OF ADMISSION:  08/10/2015 ADMITTING PHYSICIAN: Henreitta Leber, MD  DATE OF DISCHARGE: 08/11/2015 12:39 PM  PRIMARY CARE PHYSICIAN: Tama High III, MD    ADMISSION DIAGNOSIS:  Lower GI bleed [K92.2]  DISCHARGE DIAGNOSIS:  Active Problems:   GI bleed   SECONDARY DIAGNOSIS:   Past Medical History:  Diagnosis Date  . Cancer ([place])    basal cell on eyelid  . CHF (congestive heart failure) ([place])   . Cirrhosis ([place])   . Hypertension   . Normal pressure hydrocephalus   . Sleep apnea     HOSPITAL COURSE:   82 year old male with past medical history of dementia, hypertension, hyperlipidemia, history of CHF, cirrhosis cryptogenic in nature, obstructive sleep apnea, skin cancer who presents to the hospital due to multiple episodes of rectal bleeding.  1. GI bleed-this was a lower GI bleed given the patient's rectal bleeding. It was likely diverticular in nature and has now resolved.  -Patient was observed overnight in the hospital had serial hemoglobins checked which were stable. He did not require any transfusions. He was initially placed on a clear liquid diet and then advanced to a regular diet which she is not tolerating without any evidence of bleeding or abdominal pain. He was initially held but he will resume that now upon discharge as his bleeding has stopped.  2. Acute blood loss anemia-secondary to GI bleed as mentioned. -Patient's hemoglobin did drop but he did not require any transfusions and since his bleeding is stopped his hemoglobin has remained stable.  3. BPH-he will continue Avodart.  4. Essential hypertension- he will continue lisinopril.  5. Dementia- He will continue Aricept.  DISCHARGE CONDITIONS:   Stable  CONSULTS OBTAINED:    DRUG ALLERGIES:   Allergies  Allergen Reactions  . Cephalexin Other (See  Comments)    Confusion, hives, hallucinatons  . Clindamycin/Lincomycin Other (See Comments)    Confusion, hives, hallucinations  . Morphine And Related Other (See Comments)    DISCHARGE MEDICATIONS:   Discharge Medication List as of 08/11/2015  9:10 AM    CONTINUE these medications which have NOT CHANGED   Details  aspirin EC 81 MG tablet Take 81 mg by mouth daily., Historical Med    donepezil (ARICEPT) 5 MG tablet Take 5 mg by mouth at bedtime., Historical Med    dutasteride (AVODART) 0.5 MG capsule Take 0.5 mg by mouth daily., Historical Med    lisinopril (PRINIVIL,ZESTRIL) 2.5 MG tablet Take 2.5 mg by mouth daily., Historical Med    Cholecalciferol (VITAMIN D-3 PO) Take 5,000 Units by mouth every evening., Historical Med    cyanocobalamin 1000 MCG tablet Take 1,000 mcg by mouth daily. , Historical Med    Docusate Calcium (STOOL SOFTENER PO) Take 1 tablet by mouth 2 (two) times daily., Historical Med    folic acid (FOLVITE) A999333 MCG tablet Take 400 mcg by mouth daily., Historical Med    Magnesium 500 MG CAPS Take 500 mg by mouth every evening. , Historical Med    Melatonin 10 MG TABS Take 10 mg by mouth at bedtime., Historical Med    Misc Natural Products (LUTEIN 20 PO) Take 1 capsule by mouth every evening., Historical Med    Multiple Vitamins-Minerals (CENTRUM SILVER PO) Take 1 tablet by mouth daily., Historical Med    naproxen sodium (ANAPROX) 220 MG tablet  Take 220 mg by mouth as needed (pain). , Historical Med    Thiamine HCl (VITAMIN B-1) 250 MG tablet Take 250 mg by mouth at bedtime., Historical Med         DISCHARGE INSTRUCTIONS:   DIET:  Cardiac diet  DISCHARGE CONDITION:  Stable  ACTIVITY:  Activity as tolerated  OXYGEN:  Home Oxygen: No.   Oxygen Delivery: room air  DISCHARGE LOCATION:  home   If you experience worsening of your admission symptoms, develop shortness of breath, life threatening emergency, suicidal or homicidal thoughts you  must seek medical attention immediately by calling 911 or calling your MD immediately  if symptoms less severe.  You Must read complete instructions/literature along with all the possible adverse reactions/side effects for all the Medicines you take and that have been prescribed to you. Take any new Medicines after you have completely understood and accpet all the possible adverse reactions/side effects.   Please note  You were cared for by a hospitalist during your hospital stay. If you have any questions about your discharge medications or the care you received while you were in the hospital after you are discharged, you can call the unit and asked to speak with the hospitalist on call if the hospitalist that took care of you is not available. Once you are discharged, your primary care physician will handle any further medical issues. Please note that NO REFILLS for any discharge medications will be authorized once you are discharged, as it is imperative that you return to your primary care physician (or establish a relationship with a primary care physician if you do not have one) for your aftercare needs so that they can reassess your need for medications and monitor your lab values.     Today   No abdominal pain, no bloody stools this morning. Hemoglobin stable. Tolerating a clear liquid diet well.  VITAL SIGNS:  Blood pressure (!) 104/59, pulse 62, temperature 98.1 F (36.7 C), temperature source Oral, resp. rate 17, height 5\' 8"  (1.727 m), weight 78.5 kg (173 lb 1 oz), SpO2 100 %.  I/O:  No intake or output data in the 24 hours ending 08/12/15 1327  PHYSICAL EXAMINATION:   GENERAL:  82 y.o.-year-old patient lying in the bed in no acute distress.  EYES: Pupils equal, round, reactive to light and accommodation. No scleral icterus. Extraocular muscles intact.  HEENT: Head atraumatic, normocephalic. Oropharynx and nasopharynx clear. No oropharyngeal erythema, moist oral mucosa  NECK:   Supple, no jugular venous distention. No thyroid enlargement, no tenderness.  LUNGS: Normal breath sounds bilaterally, no wheezing, rales, rhonchi. No use of accessory muscles of respiration.  CARDIOVASCULAR: S1, S2 RRR. No murmurs, rubs, gallops, clicks. + AICD in place.  ABDOMEN: Soft, nontender, nondistended. Bowel sounds present. No organomegaly or mass.  EXTREMITIES: No pedal edema, cyanosis, or clubbing. + 2 pedal & radial pulses b/l.   NEUROLOGIC: Cranial nerves II through XII are intact. No focal Motor or sensory deficits appreciated b/l PSYCHIATRIC: The patient is alert and oriented x 3. Good affect.  SKIN: No obvious rash, lesion, or ulcer.   DATA REVIEW:   CBC  Recent Labs Lab 08/11/15 0351 08/11/15 0908  WBC 4.3  --   HGB 8.4* 9.7*  HCT 24.2*  --   PLT 176  --     Chemistries   Recent Labs Lab 08/10/15 1618  NA 138  K 4.1  CL 103  CO2 29  GLUCOSE 104*  BUN 24*  CREATININE 0.82  CALCIUM 9.0  AST 26  ALT 20  ALKPHOS 50  BILITOT 1.0    Cardiac Enzymes No results for input(s): TROPONINI in the last 168 hours.  Microbiology Results  No results found for this or any previous visit.  RADIOLOGY:  No results found.    Management plans discussed with the patient, family and they are in agreement.  CODE STATUS:  Code Status History    Date Active Date Inactive Code Status Order ID Comments User Context   08/10/2015  9:17 PM 08/11/2015  3:39 PM Full Code [id number]  Henreitta Leber, MD Inpatient    Advance Directive Documentation   Flowsheet Row Most Recent Value  Type of Advance Directive  Living will  Pre-existing out of facility DNR order (yellow form or pink MOST form)  No data  "MOST" Form in Place?  No data      TOTAL TIME TAKING CARE OF THIS PATIENT: 35 minutes.    Henreitta Leber M.D on 08/12/2015 at 1:27 PM  Between 7am to 6pm - Pager - [phone]  After 6pm go to [email or web address] - password [email or web address] [email or web address]  [facility] [facility]   Office  [phone]  CC: Primary care physician; Adin Hector, MD

## 2015-09-03 ENCOUNTER — Other Ambulatory Visit: Payer: Self-pay | Admitting: Gastroenterology

## 2015-09-03 DIAGNOSIS — D62 Acute posthemorrhagic anemia: Secondary | ICD-10-CM

## 2015-09-03 DIAGNOSIS — K922 Gastrointestinal hemorrhage, unspecified: Secondary | ICD-10-CM

## 2015-09-14 ENCOUNTER — Ambulatory Visit
Admission: RE | Admit: 2015-09-14 | Discharge: 2015-09-14 | Disposition: A | Discharge status: Home / Self Care | Payer: Medicare HMO | Source: Ambulatory Visit | Attending: Gastroenterology | Admitting: Gastroenterology

## 2015-09-14 DIAGNOSIS — D62 Acute posthemorrhagic anemia: Secondary | ICD-10-CM

## 2015-09-14 DIAGNOSIS — K922 Gastrointestinal hemorrhage, unspecified: Secondary | ICD-10-CM

## 2015-11-01 ENCOUNTER — Ambulatory Visit: Admit: 2015-11-01 | Payer: Medicare HMO | Admitting: Ophthalmology

## 2015-11-01 SURGERY — PHACOEMULSIFICATION, CATARACT, WITH IOL INSERTION
Anesthesia: Choice | Laterality: Left

## 2016-01-20 ENCOUNTER — Encounter: Payer: Self-pay | Admitting: Emergency Medicine

## 2016-01-20 ENCOUNTER — Inpatient Hospital Stay
Admission: EM | Admit: 2016-01-20 | Discharge: 2016-01-23 | DRG: 470 | Disposition: A | Discharge status: Skilled Nursing Facility | Payer: Medicare HMO | Attending: Internal Medicine | Admitting: Internal Medicine

## 2016-01-20 ENCOUNTER — Inpatient Hospital Stay: Payer: Medicare HMO | Admitting: Anesthesiology

## 2016-01-20 ENCOUNTER — Encounter
Admission: EM | Disposition: A | Discharge status: Skilled Nursing Facility | Payer: Self-pay | Source: Home / Self Care | Attending: Internal Medicine

## 2016-01-20 ENCOUNTER — Emergency Department: Payer: Medicare HMO

## 2016-01-20 ENCOUNTER — Inpatient Hospital Stay: Payer: Medicare HMO

## 2016-01-20 DIAGNOSIS — Z8249 Family history of ischemic heart disease and other diseases of the circulatory system: Secondary | ICD-10-CM

## 2016-01-20 DIAGNOSIS — I509 Heart failure, unspecified: Secondary | ICD-10-CM | POA: Diagnosis present

## 2016-01-20 DIAGNOSIS — Z79899 Other long term (current) drug therapy: Secondary | ICD-10-CM | POA: Diagnosis not present

## 2016-01-20 DIAGNOSIS — Z8673 Personal history of transient ischemic attack (TIA), and cerebral infarction without residual deficits: Secondary | ICD-10-CM | POA: Diagnosis not present

## 2016-01-20 DIAGNOSIS — M25552 Pain in left hip: Secondary | ICD-10-CM

## 2016-01-20 DIAGNOSIS — G4730 Sleep apnea, unspecified: Secondary | ICD-10-CM | POA: Diagnosis present

## 2016-01-20 DIAGNOSIS — Z96649 Presence of unspecified artificial hip joint: Secondary | ICD-10-CM

## 2016-01-20 DIAGNOSIS — K7460 Unspecified cirrhosis of liver: Secondary | ICD-10-CM | POA: Diagnosis present

## 2016-01-20 DIAGNOSIS — G912 (Idiopathic) normal pressure hydrocephalus: Secondary | ICD-10-CM | POA: Diagnosis present

## 2016-01-20 DIAGNOSIS — Z85828 Personal history of other malignant neoplasm of skin: Secondary | ICD-10-CM | POA: Diagnosis not present

## 2016-01-20 DIAGNOSIS — Z95810 Presence of automatic (implantable) cardiac defibrillator: Secondary | ICD-10-CM | POA: Diagnosis not present

## 2016-01-20 DIAGNOSIS — Z96653 Presence of artificial knee joint, bilateral: Secondary | ICD-10-CM | POA: Diagnosis present

## 2016-01-20 DIAGNOSIS — Z885 Allergy status to narcotic agent status: Secondary | ICD-10-CM | POA: Diagnosis not present

## 2016-01-20 DIAGNOSIS — S72002A Fracture of unspecified part of neck of left femur, initial encounter for closed fracture: Secondary | ICD-10-CM | POA: Diagnosis present

## 2016-01-20 DIAGNOSIS — R262 Difficulty in walking, not elsewhere classified: Secondary | ICD-10-CM

## 2016-01-20 DIAGNOSIS — R2681 Unsteadiness on feet: Secondary | ICD-10-CM

## 2016-01-20 DIAGNOSIS — S72009A Fracture of unspecified part of neck of unspecified femur, initial encounter for closed fracture: Secondary | ICD-10-CM | POA: Diagnosis present

## 2016-01-20 DIAGNOSIS — Z7982 Long term (current) use of aspirin: Secondary | ICD-10-CM | POA: Diagnosis not present

## 2016-01-20 DIAGNOSIS — W010XXA Fall on same level from slipping, tripping and stumbling without subsequent striking against object, initial encounter: Secondary | ICD-10-CM | POA: Diagnosis present

## 2016-01-20 DIAGNOSIS — Z881 Allergy status to other antibiotic agents status: Secondary | ICD-10-CM | POA: Diagnosis not present

## 2016-01-20 DIAGNOSIS — F0390 Unspecified dementia without behavioral disturbance: Secondary | ICD-10-CM | POA: Diagnosis present

## 2016-01-20 DIAGNOSIS — E876 Hypokalemia: Secondary | ICD-10-CM | POA: Diagnosis present

## 2016-01-20 DIAGNOSIS — I110 Hypertensive heart disease with heart failure: Secondary | ICD-10-CM | POA: Diagnosis present

## 2016-01-20 HISTORY — PX: HIP ARTHROPLASTY: SHX981

## 2016-01-20 LAB — SURGICAL PCR SCREEN
MRSA, PCR: NEGATIVE
Staphylococcus aureus: NEGATIVE

## 2016-01-20 LAB — CBC WITH DIFFERENTIAL/PLATELET
Basophils Absolute: 0.1 10*3/uL (ref 0–0.1)
Basophils Relative: 1 %
Eosinophils Absolute: 0.2 10*3/uL (ref 0–0.7)
Eosinophils Relative: 2 %
HEMATOCRIT: 43.1 % (ref 40.0–52.0)
HEMOGLOBIN: 14.9 g/dL (ref 13.0–18.0)
LYMPHS ABS: 0.5 10*3/uL — AB (ref 1.0–3.6)
LYMPHS PCT: 4 %
MCH: 32.6 pg (ref 26.0–34.0)
MCHC: 34.5 g/dL (ref 32.0–36.0)
MCV: 94.4 fL (ref 80.0–100.0)
MONOS PCT: 7 %
Monocytes Absolute: 0.8 10*3/uL (ref 0.2–1.0)
NEUTROS PCT: 86 %
Neutro Abs: 9.6 10*3/uL — ABNORMAL HIGH (ref 1.4–6.5)
Platelets: 210 10*3/uL (ref 150–440)
RBC: 4.56 MIL/uL (ref 4.40–5.90)
RDW: 14.7 % — ABNORMAL HIGH (ref 11.5–14.5)
WBC: 11.1 10*3/uL — AB (ref 3.8–10.6)

## 2016-01-20 LAB — COMPREHENSIVE METABOLIC PANEL
ALK PHOS: 48 U/L (ref 38–126)
ALT: 59 U/L (ref 17–63)
AST: 77 U/L — ABNORMAL HIGH (ref 15–41)
Albumin: 3.7 g/dL (ref 3.5–5.0)
Anion gap: 7 (ref 5–15)
BILIRUBIN TOTAL: 1.1 mg/dL (ref 0.3–1.2)
BUN: 22 mg/dL — ABNORMAL HIGH (ref 6–20)
CALCIUM: 9 mg/dL (ref 8.9–10.3)
CO2: 31 mmol/L (ref 22–32)
CREATININE: 0.94 mg/dL (ref 0.61–1.24)
Chloride: 103 mmol/L (ref 101–111)
Glucose, Bld: 131 mg/dL — ABNORMAL HIGH (ref 65–99)
Potassium: 3.4 mmol/L — ABNORMAL LOW (ref 3.5–5.1)
Sodium: 141 mmol/L (ref 135–145)
TOTAL PROTEIN: 6.7 g/dL (ref 6.5–8.1)

## 2016-01-20 LAB — PROTIME-INR
INR: 1.14
PROTHROMBIN TIME: 14.7 s (ref 11.4–15.2)

## 2016-01-20 SURGERY — HEMIARTHROPLASTY, HIP, DIRECT ANTERIOR APPROACH, FOR FRACTURE
Anesthesia: Spinal | Laterality: Left

## 2016-01-20 MED ORDER — BUPIVACAINE-EPINEPHRINE (PF) 0.25% -1:200000 IJ SOLN
INTRAMUSCULAR | Status: DC | PRN
  Administered 2016-01-20: 30 mL via PERINEURAL

## 2016-01-20 MED ORDER — ACETAMINOPHEN 650 MG RE SUPP: 650.0000 mg | Freq: Four times a day (QID) | RECTAL | Status: DC | PRN

## 2016-01-20 MED ORDER — ONDANSETRON HCL 4 MG PO TABS: 4.0000 mg | ORAL_TABLET | Freq: Four times a day (QID) | ORAL | Status: DC | PRN

## 2016-01-20 MED ORDER — ONDANSETRON HCL 4 MG/2ML IJ SOLN: 4.0000 mg | Freq: Once | INTRAMUSCULAR | Status: DC | PRN

## 2016-01-20 MED ORDER — EPHEDRINE SULFATE 50 MG/ML IJ SOLN
INTRAMUSCULAR | Status: DC | PRN
  Administered 2016-01-20: 10 mg via INTRAVENOUS

## 2016-01-20 MED ORDER — DONEPEZIL HCL 5 MG PO TABS
5.0000 mg | ORAL_TABLET | Freq: Every day | ORAL | Status: DC
  Administered 2016-01-20 – 2016-01-22 (×3): 5 mg via ORAL
  Filled 2016-01-20 (×3): qty 1

## 2016-01-20 MED ORDER — MAGNESIUM HYDROXIDE 400 MG/5ML PO SUSP
30.0000 mL | Freq: Every day | ORAL | Status: DC | PRN
  Administered 2016-01-21 – 2016-01-22 (×2): 30 mL via ORAL
  Filled 2016-01-20 (×2): qty 30

## 2016-01-20 MED ORDER — VANCOMYCIN HCL IN DEXTROSE 1-5 GM/200ML-% IV SOLN
1000.0000 mg | Freq: Two times a day (BID) | INTRAVENOUS | Status: AC
  Administered 2016-01-21: 1000 mg via INTRAVENOUS
  Filled 2016-01-20: qty 200

## 2016-01-20 MED ORDER — TRANEXAMIC ACID 1000 MG/10ML IV SOLN
INTRAVENOUS | Status: DC | PRN
  Administered 2016-01-20: 1000 mg via INTRAVENOUS

## 2016-01-20 MED ORDER — FENTANYL CITRATE (PF) 100 MCG/2ML IJ SOLN: 25.0000 ug | INTRAMUSCULAR | Status: DC | PRN

## 2016-01-20 MED ORDER — BUPIVACAINE HCL (PF) 0.5 % IJ SOLN
INTRAMUSCULAR | Status: DC | PRN
  Administered 2016-01-20: 2 mL

## 2016-01-20 MED ORDER — VANCOMYCIN HCL IN DEXTROSE 1-5 GM/200ML-% IV SOLN: 1000.0000 mg | Freq: Once | INTRAVENOUS | Status: DC

## 2016-01-20 MED ORDER — BISACODYL 10 MG RE SUPP: 10.0000 mg | Freq: Every day | RECTAL | Status: DC | PRN

## 2016-01-20 MED ORDER — FOLIC ACID 1 MG PO TABS
1000.0000 ug | ORAL_TABLET | Freq: Every day | ORAL | Status: DC
  Administered 2016-01-21 – 2016-01-23 (×3): 1 mg via ORAL
  Filled 2016-01-20 (×3): qty 1

## 2016-01-20 MED ORDER — KCL IN DEXTROSE-NACL 20-5-0.9 MEQ/L-%-% IV SOLN
INTRAVENOUS | Status: DC
  Administered 2016-01-20: 18:00:00 via INTRAVENOUS
  Filled 2016-01-20 (×4): qty 1000

## 2016-01-20 MED ORDER — NEOMYCIN-POLYMYXIN B GU 40-200000 IR SOLN
Status: AC
  Filled 2016-01-20: qty 2

## 2016-01-20 MED ORDER — DUTASTERIDE 0.5 MG PO CAPS
0.5000 mg | ORAL_CAPSULE | Freq: Every day | ORAL | Status: DC
  Administered 2016-01-21 – 2016-01-23 (×3): 0.5 mg via ORAL
  Filled 2016-01-20 (×3): qty 1

## 2016-01-20 MED ORDER — MAGNESIUM OXIDE 400 (241.3 MG) MG PO TABS
400.0000 mg | ORAL_TABLET | Freq: Every evening | ORAL | Status: DC
  Administered 2016-01-20 – 2016-01-22 (×3): 400 mg via ORAL
  Filled 2016-01-20 (×3): qty 1

## 2016-01-20 MED ORDER — FLEET ENEMA 7-19 GM/118ML RE ENEM: 1.0000 | ENEMA | Freq: Once | RECTAL | Status: DC | PRN

## 2016-01-20 MED ORDER — ACETAMINOPHEN 325 MG PO TABS: 650.0000 mg | ORAL_TABLET | Freq: Four times a day (QID) | ORAL | Status: DC | PRN

## 2016-01-20 MED ORDER — VANCOMYCIN HCL 1000 MG IV SOLR
INTRAVENOUS | Status: DC | PRN
  Administered 2016-01-20: 1000 mg via INTRAVENOUS

## 2016-01-20 MED ORDER — VITAMIN D3 25 MCG (1000 UNIT) PO TABS
2000.0000 [IU] | ORAL_TABLET | Freq: Every evening | ORAL | Status: DC
  Administered 2016-01-20 – 2016-01-22 (×3): 2000 [IU] via ORAL
  Filled 2016-01-20 (×7): qty 2

## 2016-01-20 MED ORDER — METOCLOPRAMIDE HCL 5 MG/ML IJ SOLN: 5.0000 mg | Freq: Three times a day (TID) | INTRAMUSCULAR | Status: DC | PRN

## 2016-01-20 MED ORDER — SODIUM CHLORIDE 0.9 % IJ SOLN
INTRAMUSCULAR | Status: AC
  Filled 2016-01-20: qty 50

## 2016-01-20 MED ORDER — NEOMYCIN-POLYMYXIN B GU 40-200000 IR SOLN
Status: DC | PRN
  Administered 2016-01-20: 16 mL

## 2016-01-20 MED ORDER — SODIUM CHLORIDE 0.9 % IV SOLN
INTRAVENOUS | Status: DC
  Administered 2016-01-20: 11:00:00 via INTRAVENOUS

## 2016-01-20 MED ORDER — MIDAZOLAM HCL 5 MG/5ML IJ SOLN
INTRAMUSCULAR | Status: DC | PRN
  Administered 2016-01-20 (×2): 1 mg via INTRAVENOUS

## 2016-01-20 MED ORDER — ACETAMINOPHEN 500 MG PO TABS
1000.0000 mg | ORAL_TABLET | Freq: Four times a day (QID) | ORAL | Status: AC
  Administered 2016-01-20 – 2016-01-21 (×4): 1000 mg via ORAL
  Filled 2016-01-20 (×4): qty 2

## 2016-01-20 MED ORDER — HYDROCODONE-ACETAMINOPHEN 7.5-325 MG PO TABS
1.0000 | ORAL_TABLET | ORAL | Status: DC | PRN
  Filled 2016-01-20: qty 1

## 2016-01-20 MED ORDER — METOCLOPRAMIDE HCL 10 MG PO TABS
5.0000 mg | ORAL_TABLET | Freq: Three times a day (TID) | ORAL | Status: DC | PRN
Start: 2016-01-20 — End: 2016-01-23

## 2016-01-20 MED ORDER — TRANEXAMIC ACID 1000 MG/10ML IV SOLN
INTRAVENOUS | Status: AC
  Filled 2016-01-20: qty 10

## 2016-01-20 MED ORDER — MELATONIN 10 MG PO TABS
10.0000 mg | ORAL_TABLET | Freq: Every day | ORAL | Status: DC
  Administered 2016-01-20 – 2016-01-21 (×2): 10 mg via ORAL
  Filled 2016-01-20 (×3): qty 1

## 2016-01-20 MED ORDER — EPHEDRINE 5 MG/ML INJ
INTRAVENOUS | Status: AC
  Filled 2016-01-20: qty 10

## 2016-01-20 MED ORDER — ONDANSETRON HCL 4 MG/2ML IJ SOLN: 4.0000 mg | Freq: Four times a day (QID) | INTRAMUSCULAR | Status: DC | PRN

## 2016-01-20 MED ORDER — ENOXAPARIN SODIUM 40 MG/0.4ML ~~LOC~~ SOLN
40.0000 mg | SUBCUTANEOUS | Status: DC
  Administered 2016-01-21 – 2016-01-23 (×3): 40 mg via SUBCUTANEOUS
  Filled 2016-01-20 (×3): qty 0.4

## 2016-01-20 MED ORDER — ONDANSETRON HCL 4 MG/2ML IJ SOLN
4.0000 mg | Freq: Once | INTRAMUSCULAR | Status: AC
  Administered 2016-01-20: 4 mg via INTRAVENOUS
  Filled 2016-01-20: qty 2

## 2016-01-20 MED ORDER — BUPIVACAINE HCL (PF) 0.5 % IJ SOLN
INTRAMUSCULAR | Status: AC
  Filled 2016-01-20: qty 10

## 2016-01-20 MED ORDER — MIDAZOLAM HCL 2 MG/2ML IJ SOLN
INTRAMUSCULAR | Status: AC
  Filled 2016-01-20: qty 2

## 2016-01-20 MED ORDER — LAMOTRIGINE 25 MG PO TABS
50.0000 mg | ORAL_TABLET | Freq: Every day | ORAL | Status: DC
  Administered 2016-01-20 – 2016-01-22 (×3): 50 mg via ORAL
  Filled 2016-01-20 (×3): qty 2

## 2016-01-20 MED ORDER — PROPOFOL 500 MG/50ML IV EMUL
INTRAVENOUS | Status: DC | PRN
  Administered 2016-01-20: 50 ug/kg/min via INTRAVENOUS

## 2016-01-20 MED ORDER — HYDROMORPHONE HCL 1 MG/ML IJ SOLN
1.0000 mg | Freq: Once | INTRAMUSCULAR | Status: AC
  Administered 2016-01-20: 1 mg via INTRAVENOUS
  Filled 2016-01-20: qty 1

## 2016-01-20 MED ORDER — PROPOFOL 500 MG/50ML IV EMUL
INTRAVENOUS | Status: AC
  Filled 2016-01-20: qty 50

## 2016-01-20 MED ORDER — BUPIVACAINE-EPINEPHRINE (PF) 0.25% -1:200000 IJ SOLN
INTRAMUSCULAR | Status: AC
  Filled 2016-01-20: qty 30

## 2016-01-20 MED ORDER — HYDROMORPHONE HCL 1 MG/ML IJ SOLN: 1.0000 mg | INTRAMUSCULAR | Status: DC | PRN

## 2016-01-20 MED ORDER — BUPIVACAINE LIPOSOME 1.3 % IJ SUSP
INTRAMUSCULAR | Status: AC
  Filled 2016-01-20: qty 20

## 2016-01-20 MED ORDER — DOCUSATE SODIUM 100 MG PO CAPS
100.0000 mg | ORAL_CAPSULE | Freq: Two times a day (BID) | ORAL | Status: DC
  Administered 2016-01-20 – 2016-01-23 (×6): 100 mg via ORAL
  Filled 2016-01-20 (×6): qty 1

## 2016-01-20 MED ORDER — VANCOMYCIN HCL IN DEXTROSE 1-5 GM/200ML-% IV SOLN
1000.0000 mg | Freq: Once | INTRAVENOUS | Status: DC
  Filled 2016-01-20: qty 200

## 2016-01-20 MED ORDER — SODIUM CHLORIDE 0.9 % IV SOLN
INTRAVENOUS | Status: DC | PRN
  Administered 2016-01-20: 50 ug/min via INTRAVENOUS

## 2016-01-20 MED ORDER — PROPOFOL 10 MG/ML IV BOLUS
INTRAVENOUS | Status: DC | PRN
  Administered 2016-01-20: 30 mg via INTRAVENOUS

## 2016-01-20 MED ORDER — PHENYLEPHRINE HCL 10 MG/ML IJ SOLN
INTRAMUSCULAR | Status: DC | PRN
  Administered 2016-01-20 (×2): 100 ug via INTRAVENOUS

## 2016-01-20 MED ORDER — SENNOSIDES-DOCUSATE SODIUM 8.6-50 MG PO TABS
1.0000 | ORAL_TABLET | Freq: Every evening | ORAL | Status: DC | PRN
  Administered 2016-01-22: 1 via ORAL
  Filled 2016-01-20: qty 1

## 2016-01-20 MED ORDER — BUPIVACAINE LIPOSOME 1.3 % IJ SUSP
INTRAMUSCULAR | Status: DC | PRN
  Administered 2016-01-20: 20 mL

## 2016-01-20 MED ORDER — TRAMADOL HCL 50 MG PO TABS
50.0000 mg | ORAL_TABLET | Freq: Four times a day (QID) | ORAL | Status: DC | PRN
  Administered 2016-01-20 – 2016-01-21 (×2): 50 mg via ORAL
  Filled 2016-01-20 (×2): qty 1

## 2016-01-20 MED ORDER — DOCUSATE SODIUM 100 MG PO CAPS: 100.0000 mg | ORAL_CAPSULE | Freq: Two times a day (BID) | ORAL | Status: DC

## 2016-01-20 MED ORDER — ADULT MULTIVITAMIN W/MINERALS CH
ORAL_TABLET | Freq: Every day | ORAL | Status: DC
Start: 2016-01-20 — End: 2016-01-23
  Administered 2016-01-21 – 2016-01-23 (×3): 1 via ORAL
  Filled 2016-01-20 (×3): qty 1

## 2016-01-20 MED ORDER — DIPHENHYDRAMINE HCL 12.5 MG/5ML PO ELIX: 12.5000 mg | ORAL_SOLUTION | ORAL | Status: DC | PRN

## 2016-01-20 MED ORDER — LISINOPRIL 5 MG PO TABS
2.5000 mg | ORAL_TABLET | Freq: Every day | ORAL | Status: DC
  Administered 2016-01-21 – 2016-01-22 (×2): 2.5 mg via ORAL
  Filled 2016-01-20 (×3): qty 1

## 2016-01-20 SURGICAL SUPPLY — 58 items
BAG DECANTER FOR FLEXI CONT (MISCELLANEOUS) IMPLANT
BIT DRILL CANN LRG QC 17.0X300 (BIT) ×3 IMPLANT
BLADE SAGITTAL WIDE XTHICK NO (BLADE) ×3 IMPLANT
BLADE SURG SZ20 CARB STEEL (BLADE) ×3 IMPLANT
BNDG COHESIVE 6X5 TAN STRL LF (GAUZE/BANDAGES/DRESSINGS) ×3 IMPLANT
BOWL CEMENT MIXING ADV NOZZLE (MISCELLANEOUS) IMPLANT
CANISTER SUCT 1200ML W/VALVE (MISCELLANEOUS) ×3 IMPLANT
CANISTER SUCT 3000ML (MISCELLANEOUS) ×6 IMPLANT
CAPT HIP HEMI 2 ×3 IMPLANT
CHLORAPREP W/TINT 26ML (MISCELLANEOUS) ×6 IMPLANT
DECANTER SPIKE VIAL GLASS SM (MISCELLANEOUS) ×6 IMPLANT
DRAPE IMP U-DRAPE 54X76 (DRAPES) ×6 IMPLANT
DRAPE INCISE IOBAN 66X60 STRL (DRAPES) ×3 IMPLANT
DRAPE SHEET LG 3/4 BI-LAMINATE (DRAPES) ×3 IMPLANT
DRAPE SURG 17X11 SM STRL (DRAPES) ×3 IMPLANT
DRAPE SURG 17X23 STRL (DRAPES) ×3 IMPLANT
DRSG OPSITE POSTOP 4X12 (GAUZE/BANDAGES/DRESSINGS) IMPLANT
DRSG OPSITE POSTOP 4X14 (GAUZE/BANDAGES/DRESSINGS) IMPLANT
DRSG OPSITE POSTOP 4X8 (GAUZE/BANDAGES/DRESSINGS) ×6 IMPLANT
ELECT BLADE 6.5 EXT (BLADE) ×3 IMPLANT
ELECT CAUTERY BLADE 6.4 (BLADE) ×3 IMPLANT
ELECT REM PT RETURN 9FT ADLT (ELECTROSURGICAL) ×3
ELECTRODE REM PT RTRN 9FT ADLT (ELECTROSURGICAL) ×1 IMPLANT
GAUZE PACK 2X3YD (MISCELLANEOUS) IMPLANT
GLOVE BIO SURGEON STRL SZ8 (GLOVE) ×6 IMPLANT
GLOVE INDICATOR 8.0 STRL GRN (GLOVE) ×3 IMPLANT
GOWN STRL REUS W/ TWL LRG LVL3 (GOWN DISPOSABLE) ×1 IMPLANT
GOWN STRL REUS W/ TWL XL LVL3 (GOWN DISPOSABLE) ×1 IMPLANT
GOWN STRL REUS W/TWL LRG LVL3 (GOWN DISPOSABLE) ×2
GOWN STRL REUS W/TWL XL LVL3 (GOWN DISPOSABLE) ×2
HANDPIECE INTERPULSE COAX TIP (DISPOSABLE) ×2
HOOD PEEL AWAY FLYTE STAYCOOL (MISCELLANEOUS) ×6 IMPLANT
IV NS 100ML SINGLE PACK (IV SOLUTION) IMPLANT
LABEL OR SOLS (LABEL) ×3 IMPLANT
NDL SAFETY 18GX1.5 (NEEDLE) ×3 IMPLANT
NEEDLE FILTER BLUNT 18X 1/2SAF (NEEDLE) ×2
NEEDLE FILTER BLUNT 18X1 1/2 (NEEDLE) ×1 IMPLANT
NEEDLE SPNL 20GX3.5 QUINCKE YW (NEEDLE) ×3 IMPLANT
NS IRRIG 1000ML POUR BTL (IV SOLUTION) ×3 IMPLANT
PACK HIP PROSTHESIS (MISCELLANEOUS) ×3 IMPLANT
PILLOW ABDUC SM (MISCELLANEOUS) ×3 IMPLANT
PILLOW ABDUCTION HIP (SOFTGOODS) ×3 IMPLANT
SET HNDPC FAN SPRY TIP SCT (DISPOSABLE) ×1 IMPLANT
SOL .9 NS 3000ML IRR  AL (IV SOLUTION) ×4
SOL .9 NS 3000ML IRR UROMATIC (IV SOLUTION) ×2 IMPLANT
STAPLER SKIN PROX 35W (STAPLE) ×3 IMPLANT
STRAP SAFETY BODY (MISCELLANEOUS) ×3 IMPLANT
SUT ETHIBOND 2 V 37 (SUTURE) ×9 IMPLANT
SUT VIC AB 1 CT1 36 (SUTURE) ×6 IMPLANT
SUT VIC AB 2-0 CT1 (SUTURE) ×9 IMPLANT
SUT VIC AB 2-0 CT1 27 (SUTURE) ×6
SUT VIC AB 2-0 CT1 TAPERPNT 27 (SUTURE) ×3 IMPLANT
SUT VICRYL 1-0 27IN ABS (SUTURE) ×6
SUTURE VICRYL 1-0 27IN ABS (SUTURE) ×2 IMPLANT
SYR 30ML LL (SYRINGE) ×9 IMPLANT
SYR TB 1ML 27GX1/2 LL (SYRINGE) IMPLANT
SYRINGE 10CC LL (SYRINGE) ×3 IMPLANT
TAPE TRANSPORE STRL 2 31045 (GAUZE/BANDAGES/DRESSINGS) ×3 IMPLANT

## 2016-01-20 NOTE — ED Notes (Signed)
Patient transported to X-ray 

## 2016-01-20 NOTE — OR Nursing (Signed)
Patient has pacemaker in place no pace beats noted

## 2016-01-20 NOTE — Anesthesia Procedure Notes (Signed)
Date/Time: 01/20/2016 2:00 PM Performed by: Nelda Marseille Pre-anesthesia Checklist: Patient identified, Emergency Drugs available, Suction available, Patient being monitored and Timeout performed Oxygen Delivery Method: Simple face mask

## 2016-01-20 NOTE — Progress Notes (Signed)
[facility] [place] NAME: Stephen Roth    MR#:  [id number]  DATE OF BIRTH:  05/10/1925  SUBJECTIVE:  CHIEF COMPLAINT:   Chief Complaint  Patient presents with  . Fall  . Hip Pain   After fall- found to have hip fracture- take for sx 01/20/16, seen post op, stable.  REVIEW OF SYSTEMS:  CONSTITUTIONAL: No fever, fatigue or weakness.  EYES: No blurred or double vision.  EARS, NOSE, AND THROAT: No tinnitus or ear pain.  RESPIRATORY: No cough, shortness of breath, wheezing or hemoptysis.  CARDIOVASCULAR: No chest pain, orthopnea, edema.  GASTROINTESTINAL: No nausea, vomiting, diarrhea or abdominal pain.  GENITOURINARY: No dysuria, hematuria.  ENDOCRINE: No polyuria, nocturia,  HEMATOLOGY: No anemia, easy bruising or bleeding SKIN: No rash or lesion. MUSCULOSKELETAL: No joint pain or arthritis.   NEUROLOGIC: No tingling, numbness, weakness.  PSYCHIATRY: No anxiety or depression.   ROS  DRUG ALLERGIES:   Allergies  Allergen Reactions  . Cephalexin Other (See Comments)    Confusion, hives, hallucinatons  . Clindamycin/Lincomycin Other (See Comments)    Confusion, hives, hallucinations  . Morphine And Related Other (See Comments)    VITALS:  Blood pressure (!) 107/57, pulse (!) 59, temperature 97.7 F (36.5 C), temperature source Oral, resp. rate 18, height 5\' 10"  (1.778 m), weight 82.1 kg (181 lb), SpO2 98 %.  PHYSICAL EXAMINATION:  GENERAL:  83 y.o.-year-old patient lying in the bed with no acute distress.  EYES: Pupils equal, round, reactive to light and accommodation. No scleral icterus. Extraocular muscles intact.  HEENT: Head atraumatic, normocephalic. Oropharynx and nasopharynx clear.  NECK:  Supple, no jugular venous distention. No thyroid enlargement, no tenderness.  LUNGS: Normal breath sounds bilaterally, no wheezing, rales,rhonchi or crepitation. No use of accessory muscles of respiration.  CARDIOVASCULAR: S1, S2  normal. No murmurs, rubs, or gallops.  ABDOMEN: Soft, nontender, nondistended. Bowel sounds present. No organomegaly or mass.  EXTREMITIES: No pedal edema, cyanosis, or clubbing. Left hip dressing. NEUROLOGIC: Cranial nerves II through XII are intact. Muscle strength 5/5 in all extremities. Sensation intact. Gait not checked.  PSYCHIATRIC: The patient is alert and oriented x 3.  SKIN: No obvious rash, lesion, or ulcer.   Physical Exam LABORATORY PANEL:   CBC  Recent Labs Lab 01/20/16 0340  WBC 11.1*  HGB 14.9  HCT 43.1  PLT 210   ------------------------------------------------------------------------------------------------------------------  Chemistries   Recent Labs Lab 01/20/16 0340  NA 141  K 3.4*  CL 103  CO2 31  GLUCOSE 131*  BUN 22*  CREATININE 0.94  CALCIUM 9.0  AST 77*  ALT 59  ALKPHOS 48  BILITOT 1.1   ------------------------------------------------------------------------------------------------------------------  Cardiac Enzymes No results for input(s): TROPONINI in the last 168 hours. ------------------------------------------------------------------------------------------------------------------  RADIOLOGY:  Dg Chest 1 View  Result Date: 01/20/2016 CLINICAL DATA:  Left hip fracture EXAM: CHEST 1 VIEW COMPARISON:  04/16/2010 FINDINGS: The lungs are clear except for minor granulomatous changes. Pulmonary vasculature is normal. No pleural effusion. Hilar, mediastinal and cardiac contours are unremarkable and unchanged. Transvenous cardiac leads appear intact. IMPRESSION: No active disease. Electronically Signed   By: Andreas Newport M.D.   On: 01/20/2016 05:27   Dg Hip Unilat W Or W/o Pelvis 2-3 Views Left  Result Date: 01/20/2016 CLINICAL DATA:  Status post left hip arthroplasty. EXAM: DG HIP (WITH OR WITHOUT PELVIS) 2-3V LEFT COMPARISON:  01/20/2016 at 4:08 a.m. FINDINGS: Since prior exam, a left arthroplasty has been placed. The prosthetic  component appears well seated and well-positioned. There is no acute fracture or evidence of an operative complication. IMPRESSION: Well-positioned left hip arthroplasty. Electronically Signed   By: Lajean Manes M.D.   On: 01/20/2016 16:22   Dg Hip Unilat With Pelvis 2-3 Views Left  Result Date: 01/20/2016 CLINICAL DATA:  [place] home. EXAM: DG HIP (WITH OR WITHOUT PELVIS) 2-3V LEFT COMPARISON:  None. FINDINGS: There is a left femoral neck fracture with foreshortening. No dislocation. Bony pelvis is intact. IMPRESSION: Left femoral neck fracture. Electronically Signed   By: Andreas Newport M.D.   On: 01/20/2016 05:26    ASSESSMENT AND PLAN:   Principal Problem:   Hip fracture ([place])  * Left hip fracture   S/p surgery 01/20/16.   Pain meds and DVT prophylaxis as per Ortho.   PT and rehab.  * Hypertension   Lisinopril.  * dementia   Aricept.  * CHF   Currently stable.   S/p AICD.  * NPH   Rehab.     All the records are reviewed and case discussed with Care Management/Social Workerr. Management plans discussed with the patient, family and they are in agreement.  CODE STATUS: full.  TOTAL TIME TAKING CARE OF THIS PATIENT: 35 minutes.  Pt's daughter was in room during my visit.  POSSIBLE D/C IN 2-3 DAYS, DEPENDING ON CLINICAL CONDITION.   Vaughan Basta M.D on 01/20/2016   Between 7am to 6pm - Pager - [phone]  After 6pm go to [email or web address] - password [email or web address] [facility]  Office  [phone]  CC: Primary care physician; Adin Hector, MD  Note: This dictation was prepared with Dragon dictation along with smaller phrase technology. Any transcriptional errors that result from this process are unintentional.

## 2016-01-20 NOTE — ED Notes (Signed)
Admitting MD at bedside.

## 2016-01-20 NOTE — Op Note (Signed)
01/20/2016  3:05 PM  Patient:   Stephen Roth.  Pre-Op Diagnosis:   Displaced femoral neck fracture, left hip.  Post-Op Diagnosis:   Same.  Procedure:   Left hip unipolar hemiarthroplasty.  Surgeon:   Pascal Lux, MD  Assistant:   Cameron Proud, PA-C  Anesthesia:   Spinal  Findings:   As above.  Complications:   None  EBL:   100 cc  Fluids:   650 cc crystalloid  UOP:   400 cc  TT:   None  Drains:   None  Closure:   Staples  Implants:   Biomet press-fit system with a #11 standard offset Echo femoral stem, a 52 mm outer diameter shell, and a +0 mm neck  Brief Clinical Note:   The patient is an 83 year old male who sustained the above-noted injury late last evening when he apparently lost his balance and fell while attempting get up from a chair. He was brought to the emergency room where x-rays demonstrated the above-noted fracture. The patient has been cleared medically and presents at this time for definitive management of the injury.  Procedure:   The patient was brought into the operating room. After adequate spinal anesthesia was obtained, the patient was repositioned in the right lateral decubitus position and secured using a lateral hip positioner. The left hip and lower extremity were prepped with ChloroPrep solution before being draped sterilely. Preoperative antibiotics were administered. A timeout was performed to verify the appropriate surgical site before a standard posterior approach to the hip was made through an approximately 4-5 inch incision. The incision was carried down through the subcutaneous tissues to expose the gluteal fascia and proximal end of the iliotibial band. These structures were split the length of the incision and the Charnley self-retaining hip retractor placed. The bursal tissues were swept posteriorly to expose the short external rotators. The anterior border of the piriformis tendon was identified and this plane developed down through  the capsule to enter the joint. Abundant fracture hematoma was suctioned. A flap of tissue was elevated off the posterior aspect of the femoral neck and greater trochanter and retracted posteriorly. This flap included the piriformis tendon, the short external rotators, and the posterior capsule. The femoral head was removed in its entirety, then taken to the back table where it was measured and found to be optimally replicated by a 52 mm head. The appropriate trial head was inserted and found to demonstrate an excellent suction fit.   Attention was directed to the femoral side. The femoral neck was recut 10-12 mm above the lesser trochanter using an oscillating saw. The piriformis fossa was debrided of soft tissues before the intramedullary canal was accessed through this point using a triple step reamer. The canal was reamed sequentially beginning with a #7 tapered reamer and progressing to a #11 tapered reamer. This provided excellent circumferential chatter. A box osteotome was used to establish version before the canal was broached sequentially beginning with a #7 broach and progressing to a #11 broach. This was left in place and several trial reductions performed. The permanent #11 standard offset femoral stem was impacted into place. A repeat trial reduction was performed using both the -3 mm and +0 mm neck lengths. The +0 mm neck length demonstrated excellent stability both in extension and external rotation as well as with flexion to 90 and internal rotation beyond 70. It also was stable in the position of sleep. The 52 mm outer diameter shell with the +  0 mm neck adapter construct was put together on the back table before being impacted onto the stem of the femoral component. The Morse taper locking mechanism was verified using manual distraction before the head was relocated and placed through a range of motion with the findings as described above.  The wound was copiously irrigated with bacitracin  saline solution via the jet lavage system before the peri-incisional and pericapsular tissues were injected with 30 cc of 0.5% Sensorcaine with epinephrine and 20 cc of Exparel diluted out to 60 cc with normal saline to help with postoperative analgesia. The posterior flap was reapproximated to the posterior aspect of the greater trochanter using #2 Tycron interrupted sutures placed through drill holes. The iliotibial band was reapproximated using #1 Vicryl interrupted sutures before the gluteal fascia was closed using a running #1 Vicryl suture. At this point, 1 g of transexemic acid in 10 cc of normal saline was injected into the joint to help reduce postoperative bleeding. The subcutaneous tissues were closed in several layers using 2-0 Vicryl interrupted sutures before the skin was closed using staples. A sterile occlusive dressing was applied to the wound before the patient was placed into an abduction wedge pillow. The patient was then rolled back into the supine position on the hospital bed before being awakened and returned to the recovery room in satisfactory condition after tolerating the procedure well.

## 2016-01-20 NOTE — ED Notes (Signed)
Pt returned to room from xray.

## 2016-01-20 NOTE — Anesthesia Preprocedure Evaluation (Addendum)
Anesthesia Evaluation  Patient identified by MRN, date of birth, ID band Patient awake    Reviewed: Allergy & Precautions, NPO status , Patient's Chart, lab work & pertinent test results, reviewed documented beta blocker date and time   History of Anesthesia Complications (+) Emergence Delirium  Airway Mallampati: III  TM Distance: >3 FB     Dental  (+) Chipped   Pulmonary sleep apnea ,           Cardiovascular hypertension, Pt. on medications +CHF  + Cardiac Defibrillator      Neuro/Psych    GI/Hepatic (+) Cirrhosis       ,   Endo/Other    Renal/GU      Musculoskeletal   Abdominal   Peds  Hematology   Anesthesia Other Findings   Reproductive/Obstetrics                            Anesthesia Physical Anesthesia Plan  ASA: III  Anesthesia Plan: Spinal   Post-op Pain Management:    Induction:   Airway Management Planned:   Additional Equipment:   Intra-op Plan:   Post-operative Plan:   Informed Consent: I have reviewed the patients History and Physical, chart, labs and discussed the procedure including the risks, benefits and alternatives for the proposed anesthesia with the patient or authorized representative who has indicated his/her understanding and acceptance.     Plan Discussed with: CRNA  Anesthesia Plan Comments:         Anesthesia Quick Evaluation

## 2016-01-20 NOTE — Consult Note (Signed)
ORTHOPAEDIC CONSULTATION  REQUESTING PHYSICIAN: Vaughan Basta, MD  Chief Complaint:   Left hip pain.  History of Present Illness: Stephen Roth. is a 83 y.o. male with a history of normal pressure hydrocephalus, congestive heart failure, cirrhosis, and hypertension who lives independently with his wife. Apparently, his wife suffered a stroke 3 weeks ago and the family was planning on moving both of them into an assisted living facility. The patient contracted and upper respiratory infection several days ago so somewhat weakened. Apparently, he was attempting to get up from a chair when he lost his balance and fell onto his left side. Initially, he did not express too much pain and was able to get to his bedroom. Apparently, he got up to go to the bathroom early this morning, but had too much pain to get up from the toilet seat. He was brought to the emergency room where x-rays demonstrated the a displaced left femoral neck fracture. He has been admitted in anticipation of definitive management of his injury. The patient denies striking his head or losing consciousness. He denies any associated injuries. He also denies any lightheadedness, dizziness, chest pain, shortness breath, or other symptoms that may have precipitated his fall.  Past Medical History:  Diagnosis Date  . Cancer ([place])    basal cell on eyelid  . CHF (congestive heart failure) ([place])   . Cirrhosis ([place])   . Hypertension   . Normal pressure hydrocephalus   . Sleep apnea    Past Surgical History:  Procedure Laterality Date  . HERNIA REPAIR    . IMPLANTABLE CARDIOVERTER DEFIBRILLATOR (ICD) GENERATOR CHANGE Left 07/13/2015   Procedure: ICD GENERATOR CHANGE;  Surgeon: Marzetta Board, MD;  Location: [facility] ORS;  Service: Cardiovascular;  Laterality: Left;  . JOINT REPLACEMENT Bilateral    knees  . PACEMAKER INSERTION  2006   Pace maker, defibulator    Social History   Social History  . Marital status: Married    Spouse name: N/A  . Number of children: N/A  . Years of education: N/A   Occupational History  . retired    Social History Main Topics  . Smoking status: Never Smoker  . Smokeless tobacco: Never Used  . Alcohol use No  . Drug use: No  . Sexual activity: Not Asked   Other Topics Concern  . None   Social History Narrative  . None   Family History  Problem Relation Age of Onset  . Heart disease Father   . Bipolar disorder Brother    Allergies  Allergen Reactions  . Cephalexin Other (See Comments)    Confusion, hives, hallucinatons  . Clindamycin/Lincomycin Other (See Comments)    Confusion, hives, hallucinations  . Morphine And Related Other (See Comments)   Prior to Admission medications   Medication Sig Start Date End Date Taking? Authorizing Provider  aspirin EC 81 MG tablet Take 81 mg by mouth daily.   Yes Historical Provider, MD  Cholecalciferol (VITAMIN D-3 PO) Take 2,000 Units by mouth every evening.    Yes Historical Provider, MD  Docusate Calcium (STOOL SOFTENER PO) Take 1 tablet by mouth 2 (two) times daily.   Yes Historical Provider, MD  donepezil (ARICEPT) 5 MG tablet Take 5 mg by mouth at bedtime.   Yes Historical Provider, MD  dutasteride (AVODART) 0.5 MG capsule Take 0.5 mg by mouth daily.   Yes Historical Provider, MD  folic acid (FOLVITE) A999333 MCG tablet Take 400 mcg by mouth daily.   Yes  Historical Provider, MD  lamoTRIgine (LAMICTAL) 25 MG tablet Take 25 mg by mouth at bedtime.    Yes Historical Provider, MD  lisinopril (PRINIVIL,ZESTRIL) 2.5 MG tablet Take 2.5 mg by mouth daily.   Yes Historical Provider, MD  Magnesium 500 MG CAPS Take 500 mg by mouth every evening.    Yes Historical Provider, MD  Melatonin 10 MG TABS Take 10 mg by mouth at bedtime.   Yes Historical Provider, MD  Misc Natural Products (LUTEIN 20 PO) Take 1 capsule by mouth every evening.   Yes Historical Provider, MD   Multiple Vitamins-Minerals (CENTRUM SILVER PO) Take 1 tablet by mouth daily.   Yes Historical Provider, MD  Specialty Vitamins Products (BILBERRY PLUS PO) Take 1 tablet by mouth daily.   Yes Historical Provider, MD   Dg Chest 1 View  Result Date: 01/20/2016 CLINICAL DATA:  Left hip fracture EXAM: CHEST 1 VIEW COMPARISON:  04/16/2010 FINDINGS: The lungs are clear except for minor granulomatous changes. Pulmonary vasculature is normal. No pleural effusion. Hilar, mediastinal and cardiac contours are unremarkable and unchanged. Transvenous cardiac leads appear intact. IMPRESSION: No active disease. Electronically Signed   By: Andreas Newport M.D.   On: 01/20/2016 05:27   Dg Hip Unilat With Pelvis 2-3 Views Left  Result Date: 01/20/2016 CLINICAL DATA:  [place] at home. EXAM: DG HIP (WITH OR WITHOUT PELVIS) 2-3V LEFT COMPARISON:  None. FINDINGS: There is a left femoral neck fracture with foreshortening. No dislocation. Bony pelvis is intact. IMPRESSION: Left femoral neck fracture. Electronically Signed   By: Andreas Newport M.D.   On: 01/20/2016 05:26    Positive ROS: All other systems have been reviewed and were otherwise negative with the exception of those mentioned in the HPI and as above.  Physical Exam: General:  Alert, no acute distress Psychiatric:  Patient is competent for consent with normal mood and affect   Cardiovascular:  No pedal edema Respiratory:  No wheezing, non-labored breathing GI:  Abdomen is soft and non-tender Skin:  No lesions in the area of chief complaint Neurologic:  Sensation intact distally Lymphatic:  No axillary or cervical lymphadenopathy  Orthopedic Exam:  Orthopedic examination is limited left hip and lower extremity. The left lower extremity is noted be somewhat shortened and externally rotated as compared to the right. Examination of the left hip is notable for some swelling over the lateral aspect of the hip, but there is no ecchymosis, erythema, abrasions,  or other skin abnormalities noted. He has mild discomfort to palpation over the lateral hip region. He has more significant pain with any attempted active or passive motion of the hip or leg. He is able to actively dorsiflex and plantarflex his toes. Sensation is intact to light touch to all distributions. He has good capillary refill to his left foot.  X-rays:  X-rays of the pelvis and left hip are available for review. The patient films demonstrate a displaced left femoral neck fracture. The hip joint itself appears to be well-maintained. No lytic lesions or significant degenerative changes are identified.  Assessment: Displaced left femoral neck fracture.  Plan: The treatment options were discussed with the patient and his daughter, who is at the bedside. They would like to proceed with surgical intervention. The procedure of a left hip hemiarthroplasty was discussed in detail, as were the potential risks (including bleeding, infection, nerve and/or blood vessel injury, persistent or recurrent pain, stiffness, dislocation, leg length inequality, loosening of and/or failure of the components, need for further surgery, blood  clots, strokes, heart attacks and/or arrhythmias, etc.) and benefits. The patient and his daughter state their understanding and wish to proceed. A formal written consent will be obtained by the nursing staff.  Thank you for ask me to put spanning the care of this most pleasant man. I will be happy to follow him with you.   Pascal Lux, MD  Beeper #:  [phone]  01/20/2016 12:20 PM

## 2016-01-20 NOTE — OR Nursing (Signed)
Pulses with doppler left foot

## 2016-01-20 NOTE — ED Notes (Signed)
Pt resting in bed; pharm tech at bedside to reconcile medications; pt awake and alert; denies pain; no requests or complaints at this time

## 2016-01-20 NOTE — ED Triage Notes (Signed)
Patient brought in by ems from home for a fall. Patient with left hip pain. Patient left leg with rotation.

## 2016-01-20 NOTE — H&P (Addendum)
[facility] NAME: Stephen Roth    MR#:  [id number]  DATE OF BIRTH:  11-19-1925  DATE OF ADMISSION:  01/20/2016  PRIMARY CARE PHYSICIAN: Tama High III, MD   REQUESTING/REFERRING PHYSICIAN:   CHIEF COMPLAINT:   Chief Complaint  Patient presents with  . Fall  . Hip Pain    HISTORY OF PRESENT ILLNESS: Stephen Roth  is a 83 y.o. male with a known history of Basal cell carcinoma, congestive heart failure, cirrhosis of liver, hypertension, normal pressure hydrocephalus, sleep apnea presented to the emergency room with left hip pain. Patient tripped and fell yesterday evening around 8:30 PM at home in the living room. No loss of consciousness or head injury. Patient initially got up and move around and watched a ball game on the television. Later in the middle of the night around 3 AM he experienced more pain in the left hip area. The pain in the left hip is aching in nature 9 out of 10. His son-in-law called the EMS and patient was brought to the emergency room. Imaging studies in the emergency room showed a left femoral neck fracture. No complaints of any chest pain, shortness of breath. No headache, dizziness and blurry vision. Case was discussed with orthopedic surgeon on-call by ER physician and patient will be kept nothing by mouth for now for surgery later later part of the day.  PAST MEDICAL HISTORY:   Past Medical History:  Diagnosis Date  . Cancer ([place])    basal cell on eyelid  . CHF (congestive heart failure) ([place])   . Cirrhosis ([place])   . Hypertension   . Normal pressure hydrocephalus   . Sleep apnea     PAST SURGICAL HISTORY: Past Surgical History:  Procedure Laterality Date  . HERNIA REPAIR    . IMPLANTABLE CARDIOVERTER DEFIBRILLATOR (ICD) GENERATOR CHANGE Left 07/13/2015   Procedure: ICD GENERATOR CHANGE;  Surgeon: Marzetta Board, MD;  Location: [facility] ORS;  Service: Cardiovascular;  Laterality: Left;  . JOINT  REPLACEMENT Bilateral    knees  . PACEMAKER INSERTION  2006   Pace maker, defibulator    SOCIAL HISTORY:  Social History  Substance Use Topics  . Smoking status: Never Smoker  . Smokeless tobacco: Never Used  . Alcohol use No    FAMILY HISTORY:  Family History  Problem Relation Age of Onset  . Heart disease Father   . Bipolar disorder Brother     DRUG ALLERGIES:  Allergies  Allergen Reactions  . Cephalexin Other (See Comments)    Confusion, hives, hallucinatons  . Clindamycin/Lincomycin Other (See Comments)    Confusion, hives, hallucinations  . Morphine And Related Other (See Comments)    REVIEW OF SYSTEMS:   CONSTITUTIONAL: No fever, fatigue or weakness.  EYES: No blurred or double vision.  EARS, NOSE, AND THROAT: No tinnitus or ear pain.  RESPIRATORY: No cough, shortness of breath, wheezing or hemoptysis.  CARDIOVASCULAR: No chest pain, orthopnea, edema.  GASTROINTESTINAL: No nausea, vomiting, diarrhea or abdominal pain.  GENITOURINARY: No dysuria, hematuria.  ENDOCRINE: No polyuria, nocturia,  HEMATOLOGY: No anemia, easy bruising or bleeding SKIN: No rash or lesion. MUSCULOSKELETAL: Has left hip pain. NEUROLOGIC: No tingling, numbness, weakness.  PSYCHIATRY: No anxiety or depression.   MEDICATIONS AT HOME:  Prior to Admission medications   Medication Sig Start Date End Date Taking? Authorizing Provider  aspirin EC 81 MG tablet Take 81 mg by mouth daily.   Yes Historical  Provider, MD  Cholecalciferol (VITAMIN D-3 PO) Take 2,000 Units by mouth every evening.    Yes Historical Provider, MD  Docusate Calcium (STOOL SOFTENER PO) Take 1 tablet by mouth 2 (two) times daily.   Yes Historical Provider, MD  donepezil (ARICEPT) 5 MG tablet Take 5 mg by mouth at bedtime.   Yes Historical Provider, MD  dutasteride (AVODART) 0.5 MG capsule Take 0.5 mg by mouth daily.   Yes Historical Provider, MD  folic acid (FOLVITE) A999333 MCG tablet Take 400 mcg by mouth daily.   Yes  Historical Provider, MD  lamoTRIgine (LAMICTAL) 25 MG tablet Take 50 mg by mouth at bedtime.   Yes Historical Provider, MD  lisinopril (PRINIVIL,ZESTRIL) 2.5 MG tablet Take 2.5 mg by mouth daily.   Yes Historical Provider, MD  Magnesium 500 MG CAPS Take 500 mg by mouth every evening.    Yes Historical Provider, MD  Melatonin 10 MG TABS Take 10 mg by mouth at bedtime.   Yes Historical Provider, MD  Misc Natural Products (LUTEIN 20 PO) Take 1 capsule by mouth every evening.   Yes Historical Provider, MD  Multiple Vitamins-Minerals (CENTRUM SILVER PO) Take 1 tablet by mouth daily.   Yes Historical Provider, MD  Specialty Vitamins Products (BILBERRY PLUS PO) Take 1 tablet by mouth daily.   Yes Historical Provider, MD      PHYSICAL EXAMINATION:   VITAL SIGNS: Blood pressure 107/68, pulse 68, temperature 97.8 F (36.6 C), temperature source Oral, resp. rate 18, height 5\' 10"  (1.778 m), weight 82.1 kg (181 lb), SpO2 96 %.  GENERAL:  83 y.o.-year-old elderly elderly well built male patient lying in the bed with no acute distress.  EYES: Pupils equal, round, reactive to light and accommodation. No scleral icterus. Extraocular muscles intact.  HEENT: Head atraumatic, normocephalic. Oropharynx and nasopharynx clear.  NECK:  Supple, no jugular venous distention. No thyroid enlargement, no tenderness.  LUNGS: Normal breath sounds bilaterally, no wheezing, rales,rhonchi or crepitation. No use of accessory muscles of respiration.  CARDIOVASCULAR: S1, S2 normal. No murmurs, rubs, or gallops.  ABDOMEN: Soft, nontender, nondistended. Bowel sounds present. No organomegaly or mass.  EXTREMITIES: No pedal edema, cyanosis, or clubbing. Tenderness left hip area.  NEUROLOGIC: Cranial nerves II through XII are intact. Muscle strength 5/5 in all extremities. Sensation intact. Gait not checked.  PSYCHIATRIC: The patient is alert and oriented x 3.  SKIN: No obvious rash Abrasion over left elbow area  LABORATORY PANEL:    CBC  Recent Labs Lab 01/20/16 0340  WBC 11.1*  HGB 14.9  HCT 43.1  PLT 210  MCV 94.4  MCH 32.6  MCHC 34.5  RDW 14.7*  LYMPHSABS 0.5*  MONOABS 0.8  EOSABS 0.2  BASOSABS 0.1   ------------------------------------------------------------------------------------------------------------------  Chemistries   Recent Labs Lab 01/20/16 0340  NA 141  K 3.4*  CL 103  CO2 31  GLUCOSE 131*  BUN 22*  CREATININE 0.94  CALCIUM 9.0  AST 77*  ALT 59  ALKPHOS 48  BILITOT 1.1   ------------------------------------------------------------------------------------------------------------------ estimated creatinine clearance is 56.1 mL/min (by C-G formula based on SCr of 0.94 mg/dL). ------------------------------------------------------------------------------------------------------------------ No results for input(s): TSH, T4TOTAL, T3FREE, THYROIDAB in the last 72 hours.  Invalid input(s): FREET3   Coagulation profile  Recent Labs Lab 01/20/16 0340  INR 1.14   ------------------------------------------------------------------------------------------------------------------- No results for input(s): DDIMER in the last 72 hours. -------------------------------------------------------------------------------------------------------------------  Cardiac Enzymes No results for input(s): CKMB, TROPONINI, MYOGLOBIN in the last 168 hours.  Invalid input(s): CK ------------------------------------------------------------------------------------------------------------------ Invalid input(s):  POCBNP  ---------------------------------------------------------------------------------------------------------------  Urinalysis    Component Value Date/Time   COLORURINE YELLOW (A) 06/18/2015 1214   APPEARANCEUR CLEAR (A) 06/18/2015 1214   LABSPEC 1.019 06/18/2015 1214   PHURINE 5.0 06/18/2015 1214   GLUCOSEU NEGATIVE 06/18/2015 1214   HGBUR NEGATIVE 06/18/2015 1214    BILIRUBINUR NEGATIVE 06/18/2015 1214   KETONESUR NEGATIVE 06/18/2015 1214   PROTEINUR 30 (A) 06/18/2015 1214   NITRITE NEGATIVE 06/18/2015 1214   LEUKOCYTESUR 1+ (A) 06/18/2015 1214     RADIOLOGY: Dg Chest 1 View  Result Date: 01/20/2016 CLINICAL DATA:  Left hip fracture EXAM: CHEST 1 VIEW COMPARISON:  04/16/2010 FINDINGS: The lungs are clear except for minor granulomatous changes. Pulmonary vasculature is normal. No pleural effusion. Hilar, mediastinal and cardiac contours are unremarkable and unchanged. Transvenous cardiac leads appear intact. IMPRESSION: No active disease. Electronically Signed   By: Andreas Newport M.D.   On: 01/20/2016 05:27   Dg Hip Unilat With Pelvis 2-3 Views Left  Result Date: 01/20/2016 CLINICAL DATA:  [place] at home. EXAM: DG HIP (WITH OR WITHOUT PELVIS) 2-3V LEFT COMPARISON:  None. FINDINGS: There is a left femoral neck fracture with foreshortening. No dislocation. Bony pelvis is intact. IMPRESSION: Left femoral neck fracture. Electronically Signed   By: Andreas Newport M.D.   On: 01/20/2016 05:26    EKG: Orders placed or performed during the hospital encounter of 01/20/16  . ED EKG  . ED EKG  . EKG 12-Lead  . EKG 12-Lead    IMPRESSION AND PLAN: 83 year old male patient with history of cirrhosis of liver, hypertension, normal pressure hydrocephalus had a fall after he last balance. Admitting diagnosis 1. Left femoral neck fracture 2. Accidental fall 3. Left hip pain 4. Hypokalemia 5. Cirrhosis of liver 6. Hypertension Treatment plan Admit patient to inpatient service medical floor IV fluid hydration Orthopedic surgery consultation for repair of left femoral neck fracture Pain management Replace potassium DVT prophylaxis sequential compression devices to lower extremities Supportive care. All the records are reviewed and case discussed with ED provider. Management plans discussed with the patient, family and they are in agreement.  CODE  STATUS:FULL CODE Surrogate decision maker : wife and daughter Code Status History    Date Active Date Inactive Code Status Order ID Comments User Context   08/10/2015  9:17 PM 08/11/2015  3:39 PM Full Code [id number]  Henreitta Leber, MD Inpatient       TOTAL TIME TAKING CARE OF THIS PATIENT: 50 minutes.    Saundra Shelling M.D on 01/20/2016 at 6:40 AM  Between 7am to 6pm - Pager - [phone]  After 6pm go to [email or web address] - password [email or web address] [facility]  [facility]  Office  [phone]  CC: Primary care physician; Adin Hector, MD

## 2016-01-20 NOTE — Progress Notes (Signed)
Surgical consent signed. Foley placed.

## 2016-01-20 NOTE — Transfer of Care (Signed)
Immediate Anesthesia Transfer of Care Note  Patient: Stephen Roth.  Procedure(s) Performed: Procedure(s): ARTHROPLASTY BIPOLAR HIP (HEMIARTHROPLASTY) (Left)  Patient Location: PACU  Anesthesia Type:Spinal  Level of Consciousness: awake, alert  and oriented  Airway & Oxygen Therapy: Patient Spontanous Breathing and Patient connected to face mask oxygen  Post-op Assessment: Report given to RN and Post -op Vital signs reviewed and stable  Post vital signs: Reviewed and stable  Last Vitals:  Vitals:   01/20/16 0630 01/20/16 0750  BP: 100/64 99/60  Pulse: 84 65  Resp: 17   Temp:  36.7 C    Last Pain:  Vitals:   01/20/16 0750  TempSrc: Oral  PainSc:          Complications: No apparent anesthesia complications

## 2016-01-20 NOTE — ED Notes (Signed)
Patient transport to 142

## 2016-01-20 NOTE — ED Provider Notes (Addendum)
Time Seen: Approximately *401  I have reviewed the triage notes  Chief Complaint: Fall and Hip Pain   History of Present Illness: Stephen Oh. is a 83 y.o. male who presents with status post fall. Patient denies any syncopal episode. He states he lost his balance and fell landing primarily on his left side. Injury occurred last evening approximately 8:00 and he states the last time he had something to eat was 7 PM. Patient was able actually to get up and go the bathroom and called EMS after he was having trouble getting up off the toilet. He denies any head trauma   Past Medical History:  Diagnosis Date  . Cancer ([place])    basal cell on eyelid  . CHF (congestive heart failure) ([place])   . Cirrhosis ([place])   . Hypertension   . Normal pressure hydrocephalus   . Sleep apnea     Patient Active Problem List   Diagnosis Date Noted  . Hip fracture ([place]) 01/20/2016  . GI bleed 08/10/2015    Past Surgical History:  Procedure Laterality Date  . HERNIA REPAIR    . IMPLANTABLE CARDIOVERTER DEFIBRILLATOR (ICD) GENERATOR CHANGE Left 07/13/2015   Procedure: ICD GENERATOR CHANGE;  Surgeon: Marzetta Board, MD;  Location: [facility] ORS;  Service: Cardiovascular;  Laterality: Left;  . JOINT REPLACEMENT Bilateral    knees  . PACEMAKER INSERTION  2006   Pace maker, defibulator    Past Surgical History:  Procedure Laterality Date  . HERNIA REPAIR    . IMPLANTABLE CARDIOVERTER DEFIBRILLATOR (ICD) GENERATOR CHANGE Left 07/13/2015   Procedure: ICD GENERATOR CHANGE;  Surgeon: Marzetta Board, MD;  Location: [facility] ORS;  Service: Cardiovascular;  Laterality: Left;  . JOINT REPLACEMENT Bilateral    knees  . PACEMAKER INSERTION  2006   Pace maker, defibulator    Current Outpatient Rx  . Order #: [id number] Class: Historical Med  . Order #: [id number] Class: Historical Med  . Order #: [id number] Class: Historical Med  . Order #: [id number] Class: Historical Med  . Order #: [id number] Class: Historical  Med  . Order #: [id number] Class: Historical Med  . Order #: [id number] Class: Historical Med  . Order #: [id number] Class: Historical Med  . Order #: [id number] Class: Historical Med  . Order #: [id number] Class: Historical Med  . Order #: [id number] Class: Historical Med  . Order #: [id number] Class: Historical Med  . Order #: [id number] Class: Historical Med    Allergies:  Cephalexin; Clindamycin/lincomycin; and Morphine and related  Family History: Family History  Problem Relation Age of Onset  . Heart disease Father   . Bipolar disorder Brother     Social History: Social History  Substance Use Topics  . Smoking status: Never Smoker  . Smokeless tobacco: Never Used  . Alcohol use No     Review of Systems:   10 point review of systems was performed and was otherwise negative:  Constitutional: No fever Eyes: No visual disturbances ENT: No sore throat, ear pain Cardiac: No chest pain Respiratory: No shortness of breath, wheezing, or stridor Abdomen: No abdominal pain, no vomiting, No diarrhea Endocrine: No weight loss, No night sweats Extremities: Left hip pain with radiation down into the left thigh region Skin: No rashes, easy bruising Neurologic: No focal weakness, trouble with speech or swollowing Urologic: No dysuria, Hematuria, or urinary frequency He denies any cervical, thoracic, lumbar spine pain  Physical Exam:  ED Triage Vitals  Enc Vitals Group     BP 01/20/16 0338 106/69     Pulse  Rate 01/20/16 0338 (!) 58     Resp 01/20/16 0338 18     Temp 01/20/16 0338 97.8 F (36.6 C)     Temp Source 01/20/16 0338 Oral     SpO2 01/20/16 0338 99 %     Weight 01/20/16 0332 181 lb (82.1 kg)     Height 01/20/16 0332 5\' 10"  (1.778 m)     Head Circumference --      Peak Flow --      Pain Score 01/20/16 0333 9     Pain Loc --      Pain Edu? --      Excl. in [place]? --     General: Awake , Alert , and Oriented times 3; GCS 15 Head: Normal cephalic , atraumatic Eyes: Pupils  equal , round, reactive to light Nose/Throat: No nasal drainage, patent upper airway without erythema or exudate.  Neck: Supple, Full range of motion, No anterior adenopathy or palpable thyroid masses Lungs: Clear to ascultation without wheezes , rhonchi, or rales Heart: Regular rate, regular rhythm without murmurs , gallops , or rubs Abdomen: Soft, non tender without rebound, guarding , or rigidity; bowel sounds positive and symmetric in all 4 quadrants. No organomegaly .        Extremities: Tenderness with hip flexion and in the hip is slightly shortened and rotated. Extremities otherwise neurovascularly intact Neurologic: normal ambulation, Motor symmetric without deficits, sensory intact Skin: warm, dry, no rashes   Labs:   All laboratory work was reviewed including any pertinent negatives or positives listed below:  Labs Reviewed  CBC WITH DIFFERENTIAL/PLATELET - Abnormal; Notable for the following:       Result Value   WBC 11.1 (*)    RDW 14.7 (*)    Neutro Abs 9.6 (*)    Lymphs Abs 0.5 (*)    All other components within normal limits  COMPREHENSIVE METABOLIC PANEL - Abnormal; Notable for the following:    Potassium 3.4 (*)    Glucose, Bld 131 (*)    BUN 22 (*)    AST 77 (*)    All other components within normal limits  PROTIME-INR    Radiology:  "Dg Chest 1 View  Result Date: 01/20/2016 CLINICAL DATA:  Left hip fracture EXAM: CHEST 1 VIEW COMPARISON:  04/16/2010 FINDINGS: The lungs are clear except for minor granulomatous changes. Pulmonary vasculature is normal. No pleural effusion. Hilar, mediastinal and cardiac contours are unremarkable and unchanged. Transvenous cardiac leads appear intact. IMPRESSION: No active disease. Electronically Signed   By: Andreas Newport M.D.   On: 01/20/2016 05:27   Dg Hip Unilat With Pelvis 2-3 Views Left  Result Date: 01/20/2016 CLINICAL DATA:  [place] at home. EXAM: DG HIP (WITH OR WITHOUT PELVIS) 2-3V LEFT COMPARISON:  None. FINDINGS:  There is a left femoral neck fracture with foreshortening. No dislocation. Bony pelvis is intact. IMPRESSION: Left femoral neck fracture. Electronically Signed   By: Andreas Newport M.D.   On: 01/20/2016 05:26  " * I personally reviewed the radiologic studies  EKG: ED ECG REPORT I, Daymon Larsen, the attending physician, personally viewed and interpreted this ECG.  Date: 01/20/2016 EKG Time: 0620Rate: 80 Rhythm: Ectopic atrial rhythm with artifact QRS Axis: normal Intervals: normal ST/T Wave abnormalities: normal Conduction Disturbances: none Narrative Interpretation: unremarkable No acute ischemic changes are noted  ED Course:  Patient's case was reviewed with orthopedics unassigned and also the hospitalist team. He appears to have suffered a femoral neck fracture without any  other associated Injuries. Patient's otherwise hemodynamically stable. Clinical Course      Assessment:  Left sided femoral neck fracture closed  Final Clinical Impression:  Final diagnoses:  Closed fracture of left hip, initial encounter [facility])     Plan:  Inpatient            Daymon Larsen, MD 01/20/16 [place], MD 01/20/16 [id number]

## 2016-01-21 ENCOUNTER — Encounter: Payer: Self-pay | Admitting: Surgery

## 2016-01-21 LAB — BASIC METABOLIC PANEL
ANION GAP: 2 — AB (ref 5–15)
BUN: 13 mg/dL (ref 6–20)
CALCIUM: 8.2 mg/dL — AB (ref 8.9–10.3)
CO2: 26 mmol/L (ref 22–32)
CREATININE: 0.76 mg/dL (ref 0.61–1.24)
Chloride: 111 mmol/L (ref 101–111)
GFR calc Af Amer: >60 mL/min (ref >60)
GLUCOSE: 107 mg/dL — AB (ref 65–99)
Potassium: 3.4 mmol/L — ABNORMAL LOW (ref 3.5–5.1)
Sodium: 139 mmol/L (ref 135–145)

## 2016-01-21 LAB — CBC
HCT: 34.8 % — ABNORMAL LOW (ref 40.0–52.0)
Hemoglobin: 12 g/dL — ABNORMAL LOW (ref 13.0–18.0)
MCH: 32.9 pg (ref 26.0–34.0)
MCHC: 34.5 g/dL (ref 32.0–36.0)
MCV: 95.4 fL (ref 80.0–100.0)
PLATELETS: 178 10*3/uL (ref 150–440)
RBC: 3.65 MIL/uL — ABNORMAL LOW (ref 4.40–5.90)
RDW: 14.3 % (ref 11.5–14.5)
WBC: 5.9 10*3/uL (ref 3.8–10.6)

## 2016-01-21 MED ORDER — ASPIRIN EC 81 MG PO TBEC
81.0000 mg | DELAYED_RELEASE_TABLET | Freq: Every day | ORAL | Status: DC
  Administered 2016-01-21 – 2016-01-23 (×3): 81 mg via ORAL
  Filled 2016-01-21 (×3): qty 1

## 2016-01-21 MED ORDER — POTASSIUM CHLORIDE 20 MEQ PO PACK
40.0000 meq | PACK | Freq: Once | ORAL | Status: AC
  Administered 2016-01-21: 40 meq via ORAL
  Filled 2016-01-21: qty 2

## 2016-01-21 MED ORDER — IBUPROFEN 400 MG PO TABS
600.0000 mg | ORAL_TABLET | Freq: Four times a day (QID) | ORAL | Status: DC | PRN
  Administered 2016-01-21 – 2016-01-22 (×2): 600 mg via ORAL
  Filled 2016-01-21 (×2): qty 1

## 2016-01-21 NOTE — Care Management Important Message (Signed)
Important Message  Patient Details  Name: Stephen Roth. MRN: [id number] Date of Birth: November 19, 1925   Medicare Important Message Given:  Yes    Jolly Mango, RN 01/21/2016, 10:50 AM

## 2016-01-21 NOTE — NC FL2 (Signed)
[place] LEVEL OF CARE SCREENING TOOL     IDENTIFICATION  Patient Name: Stephen Roth. Birthdate: November 30, 1925 Sex: male Admission Date (Current Location): 01/20/2016  [place] and [place] Number:  Engineering geologist and Address:  [facility], [address], [place], [place] [address]      Provider Number: [id number]  Attending Physician Name and Address:  Vaughan Basta, MD  Relative Name and Phone Number:       Current Level of Care: Hospital Recommended Level of Care: [place] Prior Approval Number:    Date Approved/Denied:   PASRR Number:  ([id number])  Discharge Plan: SNF    Current Diagnoses: Patient Active Problem List   Diagnosis Date Noted  . Hip fracture ([place]) 01/20/2016  . GI bleed 08/10/2015    Orientation RESPIRATION BLADDER Height & Weight     Self, Time, Situation, Place  Normal Continent Weight: 181 lb (82.1 kg) Height:  5\' 10"  (177.8 cm)  BEHAVIORAL SYMPTOMS/MOOD NEUROLOGICAL BOWEL NUTRITION STATUS   (none)  (none) Continent Diet (Regular Diet )  AMBULATORY STATUS COMMUNICATION OF NEEDS Skin   Extensive Assist Verbally Surgical wounds (Incision: Left Hip. )                       Personal Care Assistance Level of Assistance  Bathing, Feeding, Dressing Bathing Assistance: Limited assistance Feeding assistance: Independent Dressing Assistance: Limited assistance     Functional Limitations Info  Sight, Hearing, Speech Sight Info: Adequate Hearing Info: Adequate Speech Info: Adequate    SPECIAL CARE FACTORS FREQUENCY  PT (By licensed PT), OT (By licensed OT)     PT Frequency:  (5) OT Frequency:  (5)            Contractures      Additional Factors Info  Code Status, Allergies Code Status Info:  (Full Code. ) Allergies Info:  (Cephalexin, Clindamycin/lincomycin, Morphine And Related)           Current Medications (01/21/2016):  This is the current  hospital active medication list Current Facility-Administered Medications  Medication Dose Route Frequency Provider Last Rate Last Dose  . acetaminophen (TYLENOL) tablet 650 mg  650 mg Oral Q6H PRN Corky Mull, MD       Or  . acetaminophen (TYLENOL) suppository 650 mg  650 mg Rectal Q6H PRN Corky Mull, MD      . acetaminophen (TYLENOL) tablet 1,000 mg  1,000 mg Oral Q6H Corky Mull, MD   1,000 mg at 01/21/16 0601  . bisacodyl (DULCOLAX) suppository 10 mg  10 mg Rectal Daily PRN Corky Mull, MD      . cholecalciferol (VITAMIN D) tablet 2,000 Units  2,000 Units Oral QPM Saundra Shelling, MD   2,000 Units at 01/20/16 2017  . dextrose 5 % and 0.9 % NaCl with KCl 20 mEq/L infusion   Intravenous Continuous Corky Mull, MD 75 mL/hr at 01/20/16 1824    . diphenhydrAMINE (BENADRYL) 12.5 MG/5ML elixir 12.5-25 mg  12.5-25 mg Oral Q4H PRN Corky Mull, MD      . docusate sodium (COLACE) capsule 100 mg  100 mg Oral BID Corky Mull, MD   100 mg at 01/21/16 0804  . donepezil (ARICEPT) tablet 5 mg  5 mg Oral QHS Saundra Shelling, MD   5 mg at 01/20/16 2017  . dutasteride (AVODART) capsule 0.5 mg  0.5 mg Oral Daily Saundra Shelling, MD   0.5 mg  at 01/21/16 0803  . enoxaparin (LOVENOX) injection 40 mg  40 mg Subcutaneous Q24H Corky Mull, MD   40 mg at 01/21/16 0803  . folic acid (FOLVITE) tablet 1 mg  1,000 mcg Oral Daily Saundra Shelling, MD   1 mg at 01/21/16 0804  . HYDROcodone-acetaminophen (NORCO) 7.5-325 MG per tablet 1 tablet  1 tablet Oral Q4H PRN Corky Mull, MD      . HYDROmorphone (DILAUDID) injection 1 mg  1 mg Intravenous Q3H PRN Saundra Shelling, MD      . lamoTRIgine (LAMICTAL) tablet 50 mg  50 mg Oral QHS Saundra Shelling, MD   50 mg at 01/20/16 2019  . lisinopril (PRINIVIL,ZESTRIL) tablet 2.5 mg  2.5 mg Oral Daily Saundra Shelling, MD   2.5 mg at 01/21/16 0804  . magnesium hydroxide (MILK OF MAGNESIA) suspension 30 mL  30 mL Oral Daily PRN Corky Mull, MD      . magnesium oxide (MAG-OX) tablet 400 mg  400  mg Oral QPM Saundra Shelling, MD   400 mg at 01/20/16 2017  . Melatonin TABS 10 mg  10 mg Oral QHS Saundra Shelling, MD   10 mg at 01/20/16 2019  . metoCLOPramide (REGLAN) tablet 5-10 mg  5-10 mg Oral Q8H PRN Corky Mull, MD       Or  . metoCLOPramide (REGLAN) injection 5-10 mg  5-10 mg Intravenous Q8H PRN Corky Mull, MD      . multivitamin with minerals tablet   Oral Daily Saundra Shelling, MD   1 tablet at 01/21/16 0804  . ondansetron (ZOFRAN) tablet 4 mg  4 mg Oral Q6H PRN Corky Mull, MD       Or  . ondansetron [facility]) injection 4 mg  4 mg Intravenous Q6H PRN Corky Mull, MD      . senna-docusate (Senokot-S) tablet 1 tablet  1 tablet Oral QHS PRN Saundra Shelling, MD      . sodium phosphate (FLEET) 7-19 GM/118ML enema 1 enema  1 enema Rectal Once PRN Corky Mull, MD      . traMADol Veatrice Bourbon) tablet 50-100 mg  50-100 mg Oral Q6H PRN Corky Mull, MD   50 mg at 01/21/16 0803     Discharge Medications: Please see discharge summary for a list of discharge medications.  Relevant Imaging Results:  Relevant Lab Results:   Additional Information  (SSN: [id number])  Meoshia Billing, Veronia Beets, LCSW

## 2016-01-21 NOTE — Evaluation (Signed)
Physical Therapy Evaluation Patient Details Name: Stephen Roth. MRN: [id number] DOB: [birth year] Today's Date: 01/21/2016   History of Present Illness  Pt is an 83 y.o. male presenting s/p mechanical fall sustaining displaced L femoral neck fx.  Pt s/p L hip unipolar hemiarthroplasty 01/20/16.  PMH includes CA, CHF, htn, NPH, ICD, pacemaker, B TKR.  Clinical Impression  Prior to hospital admission, pt was modified independent with functional mobility (ambulating with rollator vs SPC).  Pt lives in 1 level home alone (pt's wife recently moved to ALF and pt reports he does not know when he is going to move to ALF).  Currently pt is mod assist supine to sit, with transfers, and walking a few feet with RW.  Pt demonstrating difficulty following directions requiring increased time and tactile/vc's to perform all activities.  Pt also demonstrating posterior lean in sitting and standing requiring physical assist and cueing to correct.  Pt would benefit from skilled PT to address noted impairments and functional limitations.  Recommend pt discharge to STR when medically appropriate.    Follow Up Recommendations SNF    Equipment Recommendations  Rolling walker with 5" wheels    Recommendations for Other Services       Precautions / Restrictions Precautions Precautions: Posterior Hip;Fall Restrictions Weight Bearing Restrictions: Yes LLE Weight Bearing: Weight bearing as tolerated      Mobility  Bed Mobility Overal bed mobility: Needs Assistance Bed Mobility: Supine to Sit     Supine to sit: Mod assist;HOB elevated     General bed mobility comments: assist for trunk and L LE; vc's for technique and use of side rail; increased time to perform; posterior lean noted with sitting initially requiring physical assist and cueing to correct  Transfers Overall transfer level: Needs assistance Equipment used: Rolling walker (2 wheeled) Transfers: Sit to/from Stand Sit to Stand: Mod assist          General transfer comment: posterior lean noted with standing requiring physical assist and cueing to correct; significant vc's and extra time for hand and feet placement required  Ambulation/Gait Ambulation/Gait assistance: Mod assist Ambulation Distance (Feet): 3 Feet (bed to chair) Assistive device: Rolling walker (2 wheeled)   Gait velocity: decreased   General Gait Details: decreased stance time L LE; significant vc's and assist required to shift weight forward (d/t posterior lean) and utilize UE support on RW to offweight L LE; limited distance d/t pt with difficulty following commands and having difficulty correcting posterior lean with cueing/assist  Stairs            Wheelchair Mobility    Modified Rankin (Stroke Patients Only)       Balance Overall balance assessment: Needs assistance Sitting-balance support: Bilateral upper extremity supported;Feet supported Sitting balance-Leahy Scale: Poor Sitting balance - Comments: Posterior lean in sitting requiring mod assist and vc's to correct initially   Standing balance support: Bilateral upper extremity supported (on RW) Standing balance-Leahy Scale: Poor Standing balance comment: posterior lean in standing requiring mod assist and vc's to intermittently correct                             Pertinent Vitals/Pain Pain Assessment: 0-10 Pain Score: 7  Pain Location: L hip Pain Descriptors / Indicators: Operative site guarding;Sore;Tender Pain Intervention(s): Limited activity within patient's tolerance;Monitored during session;Premedicated before session;Repositioned  HR 57-61 bpm and O2 92% or greater on room air during session.    Home Living Family/patient  expects to be discharged to:: Private residence Living Arrangements: Spouse/significant other (Pt's spouse recently moved to ALF after stroke about 3 weeks ago per pt) Available Help at Discharge: Family Type of Home: House Home Access:  Level entry     Home Layout: One level Home Equipment: [place] - single point;Walker - 4 wheels      Prior Function Level of Independence: Needs assistance   Gait / Transfers Assistance Needed: Uses SPC in community and rollator in home.  ADL's / Homemaking Assistance Needed: Has assist for washing dishes and lawn mowing.  Comments: Pt reports he is not sure when he is going to move into the ALF with his wife.     Hand Dominance        Extremity/Trunk Assessment   Upper Extremity Assessment Upper Extremity Assessment: Generalized weakness    Lower Extremity Assessment Lower Extremity Assessment: RLE deficits/detail;LLE deficits/detail RLE Deficits / Details: Strength and ROM WFL LLE Deficits / Details: L hip flexion at least 3-/5; L knee flexion/extension at least 3/5; L DF at least 3+/5; all limited d/t pain and pt with difficulty following directions for testing       Communication   Communication: No difficulties  Cognition Arousal/Alertness: Awake/alert Behavior During Therapy: Impulsive Overall Cognitive Status:  (Oriented to person, place, situation, and day (pt reported it was 1918 and needed significant cueing to figure out it was January))                      General Comments   Nursing cleared pt for participation in physical therapy.  Pt agreeable to PT session.    Exercises Total Joint Exercises Ankle Circles/Pumps: AROM;Strengthening;Both;10 reps;Supine Quad Sets: AROM;Strengthening;Both;10 reps;Supine Short Arc Quad: AAROM;Strengthening;Both;10 reps;Supine Heel Slides: AAROM;Strengthening;Both;10 reps;Supine Hip ABduction/ADduction: AAROM;Strengthening;Both;10 reps;Supine  Extra time and tactile/vc's required for correct technique of ex's.   Assessment/Plan    PT Assessment Patient needs continued PT services  PT Problem List Decreased strength;Decreased activity tolerance;Decreased balance;Decreased mobility;Decreased knowledge of use of  DME;Decreased knowledge of precautions;Pain          PT Treatment Interventions DME instruction;Gait training;Functional mobility training;Therapeutic activities;Therapeutic exercise;Balance training;Patient/family education    PT Goals (Current goals can be found in the Care Plan section)  Acute Rehab PT Goals Patient Stated Goal: to have less L hip pain PT Goal Formulation: With patient Time For Goal Achievement: 02/04/16 Potential to Achieve Goals: Fair    Frequency BID   Barriers to discharge Decreased caregiver support      Co-evaluation               End of Session Equipment Utilized During Treatment: Gait belt Activity Tolerance: Patient limited by pain Patient left: in chair;with call bell/phone within reach;with chair alarm set;with SCD's reapplied (pillows placed between pt's knees for posterior hip precautions and also to elevated B heels) Nurse Communication: Mobility status;Precautions;Weight bearing status         Time: [id number] PT Time Calculation (min) (ACUTE ONLY): 37 min   Charges:   PT Evaluation $PT Eval Low Complexity: 1 Procedure PT Treatments $Therapeutic Exercise: 8-22 mins $Therapeutic Activity: 8-22 mins   PT G CodesLeitha Bleak 02-02-16, 10:07 AM Leitha Bleak, [place]

## 2016-01-21 NOTE — Clinical Social Work Note (Signed)
Clinical Social Work Assessment  Patient Details  Name: Stephen Roth. MRN: [id number] Date of Birth: Oct 26, 1925  Date of referral:  01/21/16               Reason for consult:  Facility Placement                Permission sought to share information with:  Chartered certified accountant granted to share information::  Yes, Verbal Permission Granted  Name::      [place]::   [place]   Relationship::     Contact Information:     Housing/Transportation Living arrangements for the past 2 months:  Freedom of Information:  Adult Children Patient Interpreter Needed:  None Criminal Activity/Legal Involvement Pertinent to Current Situation/Hospitalization:  No - Comment as needed Significant Relationships:  Adult Children, Spouse Lives with:  Spouse Do you feel safe going back to the place where you live?  Yes Need for family participation in patient care:  Yes (Comment)  Care giving concerns:  Patient lives in [place] with his wife and his daughter [name] [phone] is his primary support.    Social Worker assessment / plan: Holiday representative (CSW) received SNF consult. PT is recommending SNF. Patient is pleasantly confused and CSW contacted patient's daughter [name]. Per daughter patient's wife is being moved from [place] to [facility] today. Patient's wife was at [facility] after a stroke. Daughter is familiar with SNF process and prefers for patient to go to [facility]. CSW explained that [place] authorization will have to be received. FL2 complete and faxed out. CSW presented bed offers to daughter and she chose [facility]. [facility] admissions coordinator at [facility] is aware of accepted bed offer and will start [place] authorization today.   Employment status:  Retired Nurse, adult PT Recommendations:  [place] / Referral to community resources:   [place]  Patient/Family's Response to care:  Patient and his family are agreeable for patient to go to [facility]   Patient/Family's Understanding of and Emotional Response to Diagnosis, Current Treatment, and Prognosis:  Patient and family were very pleasant and thanked CSW for assistance.   Emotional Assessment Appearance:  Appears stated age Attitude/Demeanor/Rapport:    Affect (typically observed):  Accepting, Adaptable, Pleasant Orientation:  Oriented to Self, Oriented to Place, Fluctuating Orientation (Suspected and/or reported Sundowners) Alcohol / Substance use:  Not Applicable Psych involvement (Current and /or in the community):  No (Comment)  Discharge Needs  Concerns to be addressed:  Discharge Planning Concerns Readmission within the last 30 days:  No Current discharge risk:  Dependent with Mobility Barriers to Discharge:  Continued Medical Work up   [employer], Veronia Beets, LCSW 01/21/2016, 5:17 PM

## 2016-01-21 NOTE — Progress Notes (Signed)
Patient resting in bed. States he is a little confused. Daughter at bedside. Dressing on left hip dry and intact. Pain controlled. Continue to monitor.

## 2016-01-21 NOTE — Anesthesia Postprocedure Evaluation (Signed)
Anesthesia Post Note  Patient: Stephen Roth.  Procedure(s) Performed: Procedure(s) (LRB): ARTHROPLASTY BIPOLAR HIP (HEMIARTHROPLASTY) (Left)  Patient location during evaluation: Nursing Unit Anesthesia Type: Spinal Level of consciousness: awake and alert Pain management: pain level controlled Vital Signs Assessment: post-procedure vital signs reviewed and stable Respiratory status: spontaneous breathing Cardiovascular status: stable Postop Assessment: adequate PO intake and no signs of nausea or vomiting Anesthetic complications: no     Last Vitals:  Vitals:   01/21/16 0435 01/21/16 0708  BP: 112/63 110/67  Pulse: 69 67  Resp: 18 18  Temp: 36.7 C 36.8 C    Last Pain:  Vitals:   01/21/16 0708  TempSrc: Oral  PainSc:                  Stephen Roth

## 2016-01-21 NOTE — Clinical Social Work Placement (Signed)
   CLINICAL SOCIAL WORK PLACEMENT  NOTE  Date:  01/21/2016  Patient Details  Name: Stephen Roth. MRN: [id number] Date of Birth: [birth year]  Clinical Social Work is seeking post-discharge placement for this patient at the [place] level of care (*CSW will initial, date and re-position this form in  chart as items are completed):  Yes   Patient/family provided with [facility] Work Department's list of facilities offering this level of care within the geographic area requested by the patient (or if unable, by the patient's family).  Yes   Patient/family informed of their freedom to choose among providers that offer the needed level of care, that participate in Medicare, Medicaid or managed care program needed by the patient, have an available bed and are willing to accept the patient.  Yes   Patient/family informed of [facility]'s ownership interest in [facility] and [facility], as well as of the fact that they are under no obligation to receive care at these facilities.  PASRR submitted to EDS on       PASRR number received on       Existing PASRR number confirmed on 01/21/16     FL2 transmitted to all facilities in geographic area requested by pt/family on 01/21/16     FL2 transmitted to all facilities within larger geographic area on       Patient informed that his/her managed care company has contracts with or will negotiate with certain facilities, including the following:        Yes   Patient/family informed of bed offers received.  Patient chooses bed at  [facility] )     Physician recommends and patient chooses bed at      Patient to be transferred to   on  .  Patient to be transferred to facility by       Patient family notified on   of transfer.  Name of family member notified:        PHYSICIAN       Additional Comment:    _______________________________________________ Lamond Glantz, Veronia Beets, LCSW 01/21/2016,  5:14 PM

## 2016-01-21 NOTE — Progress Notes (Signed)
Physical Therapy Treatment Patient Details Name: Stephen Roth. MRN: [id number] DOB: 06-08-1925 Today's Date: 01/21/2016    History of Present Illness Pt is an 83 y.o. male presenting s/p mechanical fall sustaining displaced L femoral neck fx.  Pt s/p L hip unipolar hemiarthroplasty 01/20/16.  PMH includes CA, CHF, htn, NPH, ICD, pacemaker, B TKR.    PT Comments    Pt able to progress to ambulating 12 feet with RW with assist.  Pt demonstrates some confusion in general and is also a little impulsive with activity.  Posterior lean also noted with standing activities requiring assist and cueing to correct.  Pt requires extra time and cueing for activities for safety, technique, and to maintain posterior hip precautions.  Will continue to progress pt with strengthening, balance, and progressive functional mobility per pt tolerance.   Follow Up Recommendations  SNF     Equipment Recommendations  Rolling walker with 5" wheels    Recommendations for Other Services       Precautions / Restrictions Precautions Precautions: Posterior Hip;Fall Restrictions Weight Bearing Restrictions: Yes LLE Weight Bearing: Weight bearing as tolerated    Mobility  Bed Mobility Overal bed mobility: Needs Assistance Bed Mobility: Sit to Supine       Sit to supine: Mod assist;HOB elevated   General bed mobility comments: assist for trunk and L LE; vc's for technique and safety  Transfers Overall transfer level: Needs assistance Equipment used: Rolling walker (2 wheeled) Transfers: Sit to/from Stand Sit to Stand: Mod assist         General transfer comment: posterior lean noted with standing requiring physical assist and cueing to correct; vc's for hand and feet placement required  Ambulation/Gait Ambulation/Gait assistance: Min assist;Mod assist;+2 safety/equipment Ambulation Distance (Feet): 12 Feet Assistive device: Rolling walker (2 wheeled)   Gait velocity: decreased   General  Gait Details: decreased stance time L LE; significant constant vc's and extra time required for correct gait technique and use of walker   Stairs            Wheelchair Mobility    Modified Rankin (Stroke Patients Only)       Balance Overall balance assessment: Needs assistance Sitting-balance support: Bilateral upper extremity supported;Feet supported Sitting balance-Leahy Scale: Fair Sitting balance - Comments: SBA for safety   Standing balance support: Bilateral upper extremity supported (on RW) Standing balance-Leahy Scale: Poor Standing balance comment: intermittent posterior lean in standing requiring assist and vc's to correct                    Cognition Arousal/Alertness: Awake/alert Behavior During Therapy: Impulsive Overall Cognitive Status:  (Oriented to person, place, situation; not to time)                      Exercises      General Comments   Pt agreeable to PT session.      Pertinent Vitals/Pain Pain Assessment: 0-10 Pain Score: 7  Pain Location: L hip Pain Descriptors / Indicators: Operative site guarding;Sore;Tender Pain Intervention(s): Limited activity within patient's tolerance;Monitored during session;Premedicated before session;Repositioned  Vitals (HR and O2) stable and WFL throughout treatment session.    Home Living                      Prior Function            PT Goals (current goals can now be found in the care plan section) Acute Rehab PT  Goals Patient Stated Goal: to have less L hip pain PT Goal Formulation: With patient Time For Goal Achievement: 02/04/16 Potential to Achieve Goals: Fair Progress towards PT goals: Progressing toward goals    Frequency    BID      PT Plan Current plan remains appropriate    Co-evaluation             End of Session Equipment Utilized During Treatment: Gait belt Activity Tolerance: Patient tolerated treatment well Patient left: in bed;with call  bell/phone within reach;with bed alarm set;with SCD's reapplied (pillows placed between B knees for THP's and B heels elevated via pillows)     Time: [id number] PT Time Calculation (min) (ACUTE ONLY): 24 min  Charges:  $Gait Training: 8-22 mins $Therapeutic Activity: 8-22 mins                    G CodesLeitha Bleak 01/26/2016, 2:12 PM Leitha Bleak, [place]

## 2016-01-21 NOTE — Progress Notes (Signed)
Subjective: 1 Day Post-Op Procedure(s) (LRB): ARTHROPLASTY BIPOLAR HIP (HEMIARTHROPLASTY) (Left) Patient reports pain as mild.   Patient is well, and has had no acute complaints or problems Plan is to go Skilled nursing facility after hospital stay. Negative for chest pain and shortness of breath Fever: no Gastrointestinal:Negative for nausea and vomiting  Objective: Vital signs in last 24 hours: Temp:  [96.8 F (36 C)-98.6 F (37 C)] 98.2 F (36.8 C) (01/08 0708) Pulse Rate:  [59-75] 67 (01/08 0708) Resp:  [18-19] 18 (01/08 0708) BP: (98-112)/(52-67) 110/67 (01/08 0708) SpO2:  [94 %-99 %] 95 % (01/08 0708)  Intake/Output from previous day:  Intake/Output Summary (Last 24 hours) at 01/21/16 0750 Last data filed at 01/21/16 0730  Gross per 24 hour  Intake           1882.5 ml  Output             1300 ml  Net            582.5 ml    Intake/Output this shift: No intake/output data recorded.  Labs:  Recent Labs  01/20/16 0340 01/21/16 0406  HGB 14.9 12.0*    Recent Labs  01/20/16 0340 01/21/16 0406  WBC 11.1* 5.9  RBC 4.56 3.65*  HCT 43.1 34.8*  PLT 210 178    Recent Labs  01/20/16 0340 01/21/16 0406  NA 141 139  K 3.4* 3.4*  CL 103 111  CO2 31 26  BUN 22* 13  CREATININE 0.94 0.76  GLUCOSE 131* 107*  CALCIUM 9.0 8.2*    Recent Labs  01/20/16 0340  INR 1.14     EXAM General - Patient is Alert and Appropriate Extremity - ABD soft Sensation intact distally Dorsiflexion/Plantar flexion intact Incision: dressing C/D/I No cellulitis present Dressing/Incision - clean, dry, no drainage Motor Function - intact, moving foot and toes well on exam.  Past Medical History:  Diagnosis Date  . Cancer ([place])    basal cell on eyelid  . CHF (congestive heart failure) ([place])   . Cirrhosis ([place])   . Hypertension   . Normal pressure hydrocephalus   . Sleep apnea     Assessment/Plan: 1 Day Post-Op Procedure(s) (LRB): ARTHROPLASTY BIPOLAR HIP  (HEMIARTHROPLASTY) (Left) Principal Problem:   Hip fracture (HCC)  Estimated body mass index is 25.97 kg/m as calculated from the following:   Height as of this encounter: 5\' 10"  (1.778 m).   Weight as of this encounter: 82.1 kg (181 lb). Advance diet Up with therapy D/C IV fluids when tolerating po intake.  Labs reviewed, K+ 3.4, will supplement today. Up with therapy, will likely need SNF placement. No bowel movement yet but normal BS on exam without tympany.  DVT Prophylaxis - Lovenox, Foot Pumps and TED hose Weight-Bearing as tolerated to left leg  J. Cameron Proud, PA-C [facility] Orthopaedic Surgery 01/21/2016, 7:50 AM

## 2016-01-21 NOTE — Progress Notes (Signed)
[place] at [place] NAME: Stephen Roth    MR#:  [id number]  DATE OF BIRTH:  12-25-1925  SUBJECTIVE:  CHIEF COMPLAINT:   Chief Complaint  Patient presents with  . Fall  . Hip Pain   After fall- found to have hip fracture- take for sx 01/20/16,  Stable.  participated with PT today.  REVIEW OF SYSTEMS:  CONSTITUTIONAL: No fever, fatigue or weakness.  EYES: No blurred or double vision.  EARS, NOSE, AND THROAT: No tinnitus or ear pain.  RESPIRATORY: No cough, shortness of breath, wheezing or hemoptysis.  CARDIOVASCULAR: No chest pain, orthopnea, edema.  GASTROINTESTINAL: No nausea, vomiting, diarrhea or abdominal pain.  GENITOURINARY: No dysuria, hematuria.  ENDOCRINE: No polyuria, nocturia,  HEMATOLOGY: No anemia, easy bruising or bleeding SKIN: No rash or lesion. MUSCULOSKELETAL: No joint pain or arthritis.   NEUROLOGIC: No tingling, numbness, weakness.  PSYCHIATRY: No anxiety or depression.   ROS  DRUG ALLERGIES:   Allergies  Allergen Reactions  . Cephalexin Other (See Comments)    Confusion, hives, hallucinatons  . Clindamycin/Lincomycin Other (See Comments)    Confusion, hives, hallucinations  . Morphine And Related Other (See Comments)    VITALS:  Blood pressure (!) 107/56, pulse 65, temperature 97.6 F (36.4 C), temperature source Oral, resp. rate 16, height 5\' 10"  (1.778 m), weight 82.1 kg (181 lb), SpO2 98 %.  PHYSICAL EXAMINATION:  GENERAL:  83 y.o.-year-old patient lying in the bed with no acute distress.  EYES: Pupils equal, round, reactive to light and accommodation. No scleral icterus. Extraocular muscles intact.  HEENT: Head atraumatic, normocephalic. Oropharynx and nasopharynx clear.  NECK:  Supple, no jugular venous distention. No thyroid enlargement, no tenderness.  LUNGS: Normal breath sounds bilaterally, no wheezing, rales,rhonchi or crepitation. No use of accessory muscles of respiration.  CARDIOVASCULAR:  S1, S2 normal. No murmurs, rubs, or gallops.  ABDOMEN: Soft, nontender, nondistended. Bowel sounds present. No organomegaly or mass.  EXTREMITIES: No pedal edema, cyanosis, or clubbing. Left hip dressing. NEUROLOGIC: Cranial nerves II through XII are intact. Muscle strength 5/5 in all extremities. Sensation intact. Gait not checked.  PSYCHIATRIC: The patient is alert and oriented x 3.  SKIN: No obvious rash, lesion, or ulcer.   Physical Exam LABORATORY PANEL:   CBC  Recent Labs Lab 01/21/16 0406  WBC 5.9  HGB 12.0*  HCT 34.8*  PLT 178   ------------------------------------------------------------------------------------------------------------------  Chemistries   Recent Labs Lab 01/20/16 0340 01/21/16 0406  NA 141 139  K 3.4* 3.4*  CL 103 111  CO2 31 26  GLUCOSE 131* 107*  BUN 22* 13  CREATININE 0.94 0.76  CALCIUM 9.0 8.2*  AST 77*  --   ALT 59  --   ALKPHOS 48  --   BILITOT 1.1  --    ------------------------------------------------------------------------------------------------------------------  Cardiac Enzymes No results for input(s): TROPONINI in the last 168 hours. ------------------------------------------------------------------------------------------------------------------  RADIOLOGY:  Dg Chest 1 View  Result Date: 01/20/2016 CLINICAL DATA:  Left hip fracture EXAM: CHEST 1 VIEW COMPARISON:  04/16/2010 FINDINGS: The lungs are clear except for minor granulomatous changes. Pulmonary vasculature is normal. No pleural effusion. Hilar, mediastinal and cardiac contours are unremarkable and unchanged. Transvenous cardiac leads appear intact. IMPRESSION: No active disease. Electronically Signed   By: Andreas Newport M.D.   On: 01/20/2016 05:27   Dg Hip Unilat W Or W/o Pelvis 2-3 Views Left  Result Date: 01/20/2016 CLINICAL DATA:  Status post left hip arthroplasty. EXAM: DG HIP (WITH  OR WITHOUT PELVIS) 2-3V LEFT COMPARISON:  01/20/2016 at 4:08 a.m. FINDINGS:  Since prior exam, a left arthroplasty has been placed. The prosthetic component appears well seated and well-positioned. There is no acute fracture or evidence of an operative complication. IMPRESSION: Well-positioned left hip arthroplasty. Electronically Signed   By: Lajean Manes M.D.   On: 01/20/2016 16:22   Dg Hip Unilat With Pelvis 2-3 Views Left  Result Date: 01/20/2016 CLINICAL DATA:  [place] at home. EXAM: DG HIP (WITH OR WITHOUT PELVIS) 2-3V LEFT COMPARISON:  None. FINDINGS: There is a left femoral neck fracture with foreshortening. No dislocation. Bony pelvis is intact. IMPRESSION: Left femoral neck fracture. Electronically Signed   By: Andreas Newport M.D.   On: 01/20/2016 05:26    ASSESSMENT AND PLAN:   Principal Problem:   Hip fracture ([place])  * Left hip fracture   S/p surgery 01/20/16.   Pain meds and DVT prophylaxis as per Ortho.   PT and rehab.  * Hypertension   Lisinopril.  * dementia   Aricept.  * CHF   Currently stable.   S/p AICD.  * NPH   Rehab.  * Hypokalemia- replace.   All the records are reviewed and case discussed with Care Management/Social Workerr. Management plans discussed with the patient, family and they are in agreement.  CODE STATUS: full.  TOTAL TIME TAKING CARE OF THIS PATIENT: 35 minutes.  Pt's daughter was in room during my visit.  POSSIBLE D/C IN 2-3 DAYS, DEPENDING ON CLINICAL CONDITION.   Vaughan Basta M.D on 01/21/2016   Between 7am to 6pm - Pager - [phone]  After 6pm go to [email or web address] - password [email or web address] [facility]  Office  [phone]  CC: Primary care physician; Adin Hector, MD  Note: This dictation was prepared with Dragon dictation along with smaller phrase technology. Any transcriptional errors that result from this process are unintentional.

## 2016-01-22 LAB — URINALYSIS, ROUTINE W REFLEX MICROSCOPIC
BACTERIA UA: NONE SEEN
Bilirubin Urine: NEGATIVE
GLUCOSE, UA: NEGATIVE mg/dL
Hgb urine dipstick: NEGATIVE
KETONES UR: 20 mg/dL — AB
Nitrite: NEGATIVE
PROTEIN: 30 mg/dL — AB
Specific Gravity, Urine: 1.02 (ref 1.005–1.030)
pH: 5 (ref 5.0–8.0)

## 2016-01-22 LAB — BASIC METABOLIC PANEL
Anion gap: 3 — ABNORMAL LOW (ref 5–15)
BUN: 13 mg/dL (ref 6–20)
CHLORIDE: 110 mmol/L (ref 101–111)
CO2: 26 mmol/L (ref 22–32)
CREATININE: 0.7 mg/dL (ref 0.61–1.24)
Calcium: 8.5 mg/dL — ABNORMAL LOW (ref 8.9–10.3)
GFR calc Af Amer: >60 mL/min (ref >60)
GFR calc non Af Amer: >60 mL/min (ref >60)
GLUCOSE: 90 mg/dL (ref 65–99)
POTASSIUM: 3.8 mmol/L (ref 3.5–5.1)
SODIUM: 139 mmol/L (ref 135–145)

## 2016-01-22 LAB — SURGICAL PATHOLOGY

## 2016-01-22 MED ORDER — MELATONIN 5 MG PO TABS
10.0000 mg | ORAL_TABLET | Freq: Every day | ORAL | Status: DC
  Administered 2016-01-22: 10 mg via ORAL
  Filled 2016-01-22 (×2): qty 2

## 2016-01-22 NOTE — Progress Notes (Signed)
Per [facility] admissions coordinator at [facility] authorization has been received. Plan is for patient to D/C to [facility] tomorrow if stable. Clinical Social Worker (CSW) contacted patient's daughter [name] and made her aware of above.   [employer], LCSW [phone]

## 2016-01-22 NOTE — Progress Notes (Signed)
Plan is for patient to D/C to [facility] when stable. Per [facility] admissions coordinator at [facility] authorization is pending. Clinical Social Worker (CSW) will continue to follow and assist as needed.   [employer], LCSW [phone]

## 2016-01-22 NOTE — Progress Notes (Signed)
[place] at [place] NAME: Stephen Roth    MR#:  [id number]  DATE OF BIRTH:  May 01, 1925  SUBJECTIVE:  CHIEF COMPLAINT:   Chief Complaint  Patient presents with  . Fall  . Hip Pain   After fall- found to have hip fracture- take for sx 01/20/16,  Stable.  participated with PT today.   At some confusion in the morning but more clear by noon-time.  REVIEW OF SYSTEMS:  CONSTITUTIONAL: No fever, fatigue or weakness.  EYES: No blurred or double vision.  EARS, NOSE, AND THROAT: No tinnitus or ear pain.  RESPIRATORY: No cough, shortness of breath, wheezing or hemoptysis.  CARDIOVASCULAR: No chest pain, orthopnea, edema.  GASTROINTESTINAL: No nausea, vomiting, diarrhea or abdominal pain.  GENITOURINARY: No dysuria, hematuria.  ENDOCRINE: No polyuria, nocturia,  HEMATOLOGY: No anemia, easy bruising or bleeding SKIN: No rash or lesion. MUSCULOSKELETAL: No joint pain or arthritis.   NEUROLOGIC: No tingling, numbness, weakness.  PSYCHIATRY: No anxiety or depression.   ROS  DRUG ALLERGIES:   Allergies  Allergen Reactions  . Cephalexin Other (See Comments)    Confusion, hives, hallucinatons  . Clindamycin/Lincomycin Other (See Comments)    Confusion, hives, hallucinations  . Morphine And Related Other (See Comments)    VITALS:  Blood pressure 110/70, pulse (!) 56, temperature 98.4 F (36.9 C), temperature source Oral, resp. rate 16, height 5\' 10"  (1.778 m), weight 82.1 kg (181 lb), SpO2 94 %.  PHYSICAL EXAMINATION:  GENERAL:  83 y.o.-year-old patient lying in the bed with no acute distress.  EYES: Pupils equal, round, reactive to light and accommodation. No scleral icterus. Extraocular muscles intact.  HEENT: Head atraumatic, normocephalic. Oropharynx and nasopharynx clear.  NECK:  Supple, no jugular venous distention. No thyroid enlargement, no tenderness.  LUNGS: Normal breath sounds bilaterally, no wheezing, rales,rhonchi or  crepitation. No use of accessory muscles of respiration.  CARDIOVASCULAR: S1, S2 normal. No murmurs, rubs, or gallops.  ABDOMEN: Soft, nontender, nondistended. Bowel sounds present. No organomegaly or mass.  EXTREMITIES: No pedal edema, cyanosis, or clubbing. Left hip dressing. NEUROLOGIC: Cranial nerves II through XII are intact. Muscle strength 5/5 in all extremities. Sensation intact. Gait not checked.  PSYCHIATRIC: The patient is alert and oriented x 3.  SKIN: No obvious rash, lesion, or ulcer.   Physical Exam LABORATORY PANEL:   CBC  Recent Labs Lab 01/21/16 0406  WBC 5.9  HGB 12.0*  HCT 34.8*  PLT 178   ------------------------------------------------------------------------------------------------------------------  Chemistries   Recent Labs Lab 01/20/16 0340  01/22/16 0600  NA 141  < > 139  K 3.4*  < > 3.8  CL 103  < > 110  CO2 31  < > 26  GLUCOSE 131*  < > 90  BUN 22*  < > 13  CREATININE 0.94  < > 0.70  CALCIUM 9.0  < > 8.5*  AST 77*  --   --   ALT 59  --   --   ALKPHOS 48  --   --   BILITOT 1.1  --   --   < > = values in this interval not displayed. ------------------------------------------------------------------------------------------------------------------  Cardiac Enzymes No results for input(s): TROPONINI in the last 168 hours. ------------------------------------------------------------------------------------------------------------------  RADIOLOGY:  No results found.  ASSESSMENT AND PLAN:   Principal Problem:   Hip fracture ([place])  * Left hip fracture   S/p surgery 01/20/16.   Pain meds and DVT prophylaxis as per Ortho.   PT and  rehab.  * Hypertension   Lisinopril.  * dementia   Aricept.  * CHF   Currently stable.   S/p AICD.  * NPH   Rehab.  * Hypokalemia- replace. * Confusion   Check urinalysis, it was negative. And patient became more alert later on, so may be he was slightly sleepy in the morning.   All the records  are reviewed and case discussed with Care Management/Social Workerr. Management plans discussed with the patient, family and they are in agreement.  CODE STATUS: full.  TOTAL TIME TAKING CARE OF THIS PATIENT: 35 minutes.  Pt's daughter was in room during my visit.  POSSIBLE D/C IN 1-2 DAYS, DEPENDING ON CLINICAL CONDITION.   Vaughan Basta M.D on 01/22/2016   Between 7am to 6pm - Pager - [phone]  After 6pm go to [email or web address] - password [email or web address] [email or web address] [facility]  Office  [phone]  CC: Primary care physician; Adin Hector, MD  Note: This dictation was prepared with Dragon dictation along with smaller phrase technology. Any transcriptional errors that result from this process are unintentional.

## 2016-01-22 NOTE — Progress Notes (Signed)
Physical Therapy Treatment Patient Details Name: Stephen Roth. MRN: [id number] DOB: [birth year] Today's Date: 01/22/2016    History of Present Illness Pt is an 83 y.o. male presenting s/p mechanical fall sustaining displaced L femoral neck fx.  Pt s/p L hip unipolar hemiarthroplasty 01/20/16.  PMH includes CA, CHF, htn, NPH, ICD, pacemaker, B TKR.    PT Comments    Pt is progressing with ambulation distance and able to maintain longer step lengths with max cueing briefly before returning to B short step lengths.  Pt with posterior and left loss of balance intermittently requiring vc's and assist to correct.  Pt continues to be very motivated to participate in PT but complicated d/t pt's overall confusion and difficulty following instructions consistently (pt is improving each session though).  Will continue to progress pt with strengthening, balance, and decreasing assist levels with functional mobility per pt tolerance.   Follow Up Recommendations  SNF     Equipment Recommendations  Rolling walker with 5" wheels    Recommendations for Other Services       Precautions / Restrictions Precautions Precautions: Posterior Hip;Fall Restrictions Weight Bearing Restrictions: Yes LLE Weight Bearing: Weight bearing as tolerated    Mobility  Bed Mobility Overal bed mobility: Needs Assistance Bed Mobility: Supine to Sit;Sit to Supine     Supine to sit: Mod assist;HOB elevated Sit to supine: Mod assist;HOB elevated   General bed mobility comments: assist for trunk and L LE; vc's for technique and safety  Transfers Overall transfer level: Needs assistance Equipment used: Rolling walker (2 wheeled) Transfers: Sit to/from Stand Sit to Stand: Min assist;Mod assist         General transfer comment: posterior lean noted with standing requiring physical assist and cueing to correct; vc's for hand and feet placement required  Ambulation/Gait Ambulation/Gait assistance: Min  assist;Mod assist;+2 safety/equipment Ambulation Distance (Feet): 75 Feet Assistive device: Rolling walker (2 wheeled)   Gait velocity: decreased   General Gait Details: decreased stance time L LE;  consistent vc's and extra time required for correct gait technique and use of walker (pt preferring very small B step lengths requiring consistent vc's to take longer steps; pt also with posterior and L loss of balance intermittently requiring assist to steady)   Stairs            Wheelchair Mobility    Modified Rankin (Stroke Patients Only)       Balance Overall balance assessment: Needs assistance Sitting-balance support: Bilateral upper extremity supported;Feet supported Sitting balance-Leahy Scale: Poor Sitting balance - Comments: posterior lean intermittently in sitting requiring min to mod assist to correct with vc's   Standing balance support: Bilateral upper extremity supported (on RW) Standing balance-Leahy Scale: Poor Standing balance comment: intermittent posterior and left lean in standing/walking requiring assist and vc's to correct                    Cognition Arousal/Alertness: Awake/alert Behavior During Therapy: Impulsive Overall Cognitive Status:  (Oriented to person, place, situation (with extra time))                 General Comments: Inconsistent with 1 step commands    Exercises Total Joint Exercises Ankle Circles/Pumps: AROM;Strengthening;Both;10 reps;Supine Quad Sets: AROM;Strengthening;Both;10 reps;Supine Gluteal Sets: AROM;Strengthening;Both;10 reps;Supine Towel Squeeze: AROM;Strengthening;Both;10 reps;Supine Short Arc Quad: AAROM;Strengthening;Both;10 reps;Supine Heel Slides: AAROM;Strengthening;Both;10 reps;Supine Hip ABduction/ADduction: AAROM;Strengthening;Both;10 reps;Supine  Pt requiring vc's and tactile cues for correct technique of above ex's.    General Comments General  comments (skin integrity, edema, etc.): Pt in bed  upon PT entering room.  Pt agreeable to PT session.      Pertinent Vitals/Pain Pain Assessment: 0-10 Pain Score: 7  Pain Location: L hip Pain Descriptors / Indicators: Sore;Tender Pain Intervention(s): Limited activity within patient's tolerance;Monitored during session;Repositioned (Pt declining pain meds)  Vitals (HR and O2) stable and WFL throughout treatment session.    Home Living                      Prior Function            PT Goals (current goals can now be found in the care plan section) Acute Rehab PT Goals Patient Stated Goal: to have less L hip pain PT Goal Formulation: With patient Time For Goal Achievement: 02/04/16 Potential to Achieve Goals: Fair Progress towards PT goals: Progressing toward goals    Frequency    BID      PT Plan Current plan remains appropriate    Co-evaluation             End of Session Equipment Utilized During Treatment: Gait belt Activity Tolerance: Patient tolerated treatment well Patient left: in bed;with call bell/phone within reach;with bed alarm set;with SCD's reapplied (pillows placed between pt's knees for posterior THP's and B heels also elevated via pillows)     Time: [id number] PT Time Calculation (min) (ACUTE ONLY): 24 min  Charges:  $Gait Training: 8-22 mins $Therapeutic Exercise: 8-22 mins                    G CodesLeitha Bleak 02-09-16, 2:17 PM Leitha Bleak, [place]

## 2016-01-22 NOTE — Progress Notes (Signed)
Subjective: 2 Days Post-Op Procedure(s) (LRB): ARTHROPLASTY BIPOLAR HIP (HEMIARTHROPLASTY) (Left) Patient reports pain as mild.   Patient is well, and has had no acute complaints or problems Plan is to go Skilled nursing facility after hospital stay. Negative for chest pain and shortness of breath Fever: no Gastrointestinal:Negative for nausea and vomiting  Objective: Vital signs in last 24 hours: Temp:  [97.5 F (36.4 C)-98.2 F (36.8 C)] 97.5 F (36.4 C) (01/09 0717) Pulse Rate:  [51-74] 51 (01/09 0717) Resp:  [16-18] 16 (01/09 0717) BP: (107-117)/(56-63) 107/63 (01/09 0717) SpO2:  [93 %-98 %] 94 % (01/09 0717)  Intake/Output from previous day:  Intake/Output Summary (Last 24 hours) at 01/22/16 0747 Last data filed at 01/21/16 1739  Gross per 24 hour  Intake              120 ml  Output              150 ml  Net              -30 ml    Intake/Output this shift: No intake/output data recorded.  Labs:  Recent Labs  01/20/16 0340 01/21/16 0406  HGB 14.9 12.0*    Recent Labs  01/20/16 0340 01/21/16 0406  WBC 11.1* 5.9  RBC 4.56 3.65*  HCT 43.1 34.8*  PLT 210 178    Recent Labs  01/21/16 0406 01/22/16 0600  NA 139 139  K 3.4* 3.8  CL 111 110  CO2 26 26  BUN 13 13  CREATININE 0.76 0.70  GLUCOSE 107* 90  CALCIUM 8.2* 8.5*    Recent Labs  01/20/16 0340  INR 1.14     EXAM General - Patient is Alert and Lacking.  Patient knows that he is at [facility], but cannot remember undergoing therapy yesterday and other details. Extremity - ABD soft Sensation intact distally Dorsiflexion/Plantar flexion intact Incision: dressing C/D/I No cellulitis present Dressing/Incision - clean, dry, no drainage Motor Function - intact, moving foot and toes well on exam.  Past Medical History:  Diagnosis Date  . Cancer ([place])    basal cell on eyelid  . CHF (congestive heart failure) ([place])   . Cirrhosis ([place])   . Hypertension   . Normal pressure hydrocephalus   .  Sleep apnea     Assessment/Plan: 2 Days Post-Op Procedure(s) (LRB): ARTHROPLASTY BIPOLAR HIP (HEMIARTHROPLASTY) (Left) Principal Problem:   Hip fracture (HCC)  Estimated body mass index is 25.97 kg/m as calculated from the following:   Height as of this encounter: 5\' 10"  (1.778 m).   Weight as of this encounter: 82.1 kg (181 lb). Advance diet Up with therapy  Labs reviewed, Hypokalemia resolved. Continue with therapy, will likely need SNF placement. No bowel movement yet but normal BS on exam without tympany.  DVT Prophylaxis - Lovenox, Foot Pumps and TED hose Weight-Bearing as tolerated to left leg  J. Cameron Proud, PA-C [facility] 01/22/2016, 7:47 AM

## 2016-01-22 NOTE — Progress Notes (Signed)
Physical Therapy Treatment Patient Details Name: Stephen Roth. MRN: [id number] DOB: 04/08/1925 Today's Date: 01/22/2016    History of Present Illness Pt is an 83 y.o. male presenting s/p mechanical fall sustaining displaced L femoral neck fx.  Pt s/p L hip unipolar hemiarthroplasty 01/20/16.  PMH includes CA, CHF, htn, NPH, ICD, pacemaker, B TKR.    PT Comments    Pt able to progress to ambulating 40 feet with RW and 2 assist for safety (d/t pt inconsistent with 1 step commands and pt demonstrating balance impairments).  Pt continues to demonstrate posterior lean in sitting requiring cueing and assist to correct.  Pt did remember therapist from yesterday ("you're the person that helped me walk") but overall appearing confused with conversation.  Increased time required for activities d/t pt's difficulty following commands.  Will continue to progress pt with strengthening, balance, and progressive functional mobility per pt tolerance.   Follow Up Recommendations  SNF     Equipment Recommendations  Rolling walker with 5" wheels    Recommendations for Other Services       Precautions / Restrictions Precautions Precautions: Posterior Hip;Fall Restrictions Weight Bearing Restrictions: Yes LLE Weight Bearing: Weight bearing as tolerated    Mobility  Bed Mobility Overal bed mobility: Needs Assistance Bed Mobility: Supine to Sit     Supine to sit: Mod assist;HOB elevated     General bed mobility comments: assist for trunk and L LE; vc's for technique and safety  Transfers Overall transfer level: Needs assistance Equipment used: Rolling walker (2 wheeled) Transfers: Sit to/from Stand Sit to Stand: Min assist;Mod assist         General transfer comment: posterior lean noted with standing requiring physical assist and cueing to correct; vc's for hand and feet placement required  Ambulation/Gait Ambulation/Gait assistance: Min assist;Mod assist;+2  safety/equipment Ambulation Distance (Feet): 40 Feet Assistive device: Rolling walker (2 wheeled)   Gait velocity: decreased   General Gait Details: decreased stance time L LE; significant constant vc's and extra time required for correct gait technique and use of walker; pt preferring very small B step lengths requiring consistent vc's to take longer steps   Stairs            Wheelchair Mobility    Modified Rankin (Stroke Patients Only)       Balance Overall balance assessment: Needs assistance Sitting-balance support: Bilateral upper extremity supported;Feet supported Sitting balance-Leahy Scale: Poor Sitting balance - Comments: posterior lean intermittently in sitting requiring min to mod assist to correct with vc's   Standing balance support: Bilateral upper extremity supported (on RW) Standing balance-Leahy Scale: Poor Standing balance comment: intermittent posterior lean in standing requiring assist and vc's to correct                    Cognition Arousal/Alertness: Awake/alert Behavior During Therapy: Impulsive Overall Cognitive Status:  (Oriented to person (increased time for DOB), place, and situation.)                 General Comments: Inconsistent with 1 step commands    Exercises Total Joint Exercises Ankle Circles/Pumps: AROM;Strengthening;Both;10 reps;Supine Quad Sets: AROM;Strengthening;Both;10 reps;Supine Gluteal Sets: AROM;Strengthening;Both;10 reps;Supine Towel Squeeze: AROM;Strengthening;Both;10 reps;Supine Short Arc Quad: AAROM;Strengthening;Both;10 reps;Supine Heel Slides: AAROM;Strengthening;Both;10 reps;Supine Hip ABduction/ADduction: AAROM;Strengthening;Both;10 reps;Supine  Vc's and tactile cues required for technique of above ex's.    General Comments  Pt agreeable to PT session.      Pertinent Vitals/Pain Pain Assessment: 0-10 Pain Score: 6  Pain  Location: L hip Pain Descriptors / Indicators: Operative site  guarding;Sore;Tender Pain Intervention(s): Limited activity within patient's tolerance;Monitored during session;Premedicated before session;Repositioned    Home Living                      Prior Function            PT Goals (current goals can now be found in the care plan section) Acute Rehab PT Goals Patient Stated Goal: to have less L hip pain PT Goal Formulation: With patient Time For Goal Achievement: 02/04/16 Potential to Achieve Goals: Fair Progress towards PT goals: Progressing toward goals    Frequency    BID      PT Plan Current plan remains appropriate    Co-evaluation             End of Session Equipment Utilized During Treatment: Gait belt Activity Tolerance: Patient tolerated treatment well Patient left: in chair;with call bell/phone within reach;with chair alarm set;with SCD's reapplied (pillows placed between pt's knees for posterior THP's and B heels elevated via pillows)     Time: [id number] PT Time Calculation (min) (ACUTE ONLY): 38 min  Charges:  $Gait Training: 8-22 mins $Therapeutic Exercise: 8-22 mins $Therapeutic Activity: 8-22 mins                    G CodesLeitha Bleak 14-Feb-2016, 9:51 AM Leitha Bleak, [place]

## 2016-01-23 ENCOUNTER — Encounter
Admission: RE | Admit: 2016-01-23 | Discharge: 2016-01-23 | Disposition: A | Discharge status: Home / Self Care | Payer: Medicare HMO | Source: Ambulatory Visit | Attending: Internal Medicine | Admitting: Internal Medicine

## 2016-01-23 DIAGNOSIS — R05 Cough: Secondary | ICD-10-CM | POA: Insufficient documentation

## 2016-01-23 DIAGNOSIS — R350 Frequency of micturition: Secondary | ICD-10-CM | POA: Insufficient documentation

## 2016-01-23 LAB — BASIC METABOLIC PANEL
Anion gap: 5 (ref 5–15)
BUN: 14 mg/dL (ref 6–20)
CALCIUM: 8.3 mg/dL — AB (ref 8.9–10.3)
CO2: 27 mmol/L (ref 22–32)
Chloride: 109 mmol/L (ref 101–111)
Creatinine, Ser: 0.71 mg/dL (ref 0.61–1.24)
GFR calc Af Amer: >60 mL/min (ref >60)
Glucose, Bld: 89 mg/dL (ref 65–99)
POTASSIUM: 3.8 mmol/L (ref 3.5–5.1)
SODIUM: 141 mmol/L (ref 135–145)

## 2016-01-23 LAB — CBC
HCT: 34.8 % — ABNORMAL LOW (ref 40.0–52.0)
Hemoglobin: 12.1 g/dL — ABNORMAL LOW (ref 13.0–18.0)
MCH: 33.3 pg (ref 26.0–34.0)
MCHC: 34.7 g/dL (ref 32.0–36.0)
MCV: 95.8 fL (ref 80.0–100.0)
PLATELETS: 219 10*3/uL (ref 150–440)
RBC: 3.63 MIL/uL — ABNORMAL LOW (ref 4.40–5.90)
RDW: 14.3 % (ref 11.5–14.5)
WBC: 6.8 10*3/uL (ref 3.8–10.6)

## 2016-01-23 MED ORDER — HYDROCODONE-ACETAMINOPHEN 7.5-325 MG PO TABS: 1.0000 | ORAL_TABLET | Freq: Four times a day (QID) | ORAL | 0 refills | Status: DC | PRN

## 2016-01-23 MED ORDER — POLYMYXIN B-TRIMETHOPRIM 10000-0.1 UNIT/ML-% OP SOLN: 1.0000 [drp] | OPHTHALMIC | 0 refills | Status: DC

## 2016-01-23 MED ORDER — ENOXAPARIN SODIUM 40 MG/0.4ML ~~LOC~~ SOLN: 40.0000 mg | SUBCUTANEOUS | 0 refills | Status: DC

## 2016-01-23 MED ORDER — BISACODYL 10 MG RE SUPP: 10.0000 mg | Freq: Every day | RECTAL | 0 refills | Status: DC | PRN

## 2016-01-23 MED ORDER — SENNOSIDES-DOCUSATE SODIUM 8.6-50 MG PO TABS: 1.0000 | ORAL_TABLET | Freq: Every evening | ORAL | 0 refills | Status: DC | PRN

## 2016-01-23 MED ORDER — LAMOTRIGINE 25 MG PO TABS: 50.0000 mg | ORAL_TABLET | Freq: Every day | ORAL | 0 refills | Status: DC

## 2016-01-23 MED ORDER — POLYMYXIN B-TRIMETHOPRIM 10000-0.1 UNIT/ML-% OP SOLN
1.0000 [drp] | OPHTHALMIC | Status: DC
  Administered 2016-01-23: 1 [drp] via OPHTHALMIC
  Filled 2016-01-23: qty 10

## 2016-01-23 NOTE — Discharge Instructions (Signed)
Continue Lovenox 40mg  daily x 14 days. Staples can be removed on 02/01/16.  [place]oloow-up with [place] in 6 weeks.

## 2016-01-23 NOTE — NC FL2 (Signed)
[place] LEVEL OF CARE SCREENING TOOL     IDENTIFICATION  Patient Name: Stephen Roth. Birthdate: August 11, 1925 Sex: male Admission Date (Current Location): 01/20/2016  [place] and [place] Number:  Engineering geologist and Address:  [facility], [address] [place], [place] [address]      Provider Number: [id number]  Attending Physician Name and Address:  Vaughan Basta, MD  Relative Name and Phone Number:       Current Level of Care: Hospital Recommended Level of Care: [place] Prior Approval Number:    Date Approved/Denied:   PASRR Number:  [id number])  Discharge Plan: SNF    Current Diagnoses: Patient Active Problem List   Diagnosis Date Noted  . Hip fracture ([place]) 01/20/2016  . GI bleed 08/10/2015    Orientation RESPIRATION BLADDER Height & Weight     Self, Time, Situation, Place  Normal Continent Weight: 181 lb (82.1 kg) Height:  5\' 10"  (177.8 cm)  BEHAVIORAL SYMPTOMS/MOOD NEUROLOGICAL BOWEL NUTRITION STATUS   (none)  (none) Continent Diet (Regular Diet )  AMBULATORY STATUS COMMUNICATION OF NEEDS Skin   Extensive Assist Verbally Surgical wounds (Incision: Left Hip. )                       Personal Care Assistance Level of Assistance  Bathing, Feeding, Dressing Bathing Assistance: Limited assistance Feeding assistance: Independent Dressing Assistance: Limited assistance     Functional Limitations Info  Sight, Hearing, Speech Sight Info: Adequate Hearing Info: Adequate Speech Info: Adequate    SPECIAL CARE FACTORS FREQUENCY  PT (By licensed PT), OT (By licensed OT)     PT Frequency:  (5) OT Frequency:  (5)            Contractures      Additional Factors Info  Code Status, Allergies Code Status Info:  (Full Code. ) Allergies Info:  (Cephalexin, Clindamycin/lincomycin, Morphine And Related)           Current Medications (01/23/2016):  This is the current  hospital active medication list Current Facility-Administered Medications  Medication Dose Route Frequency Provider Last Rate Last Dose  . acetaminophen (TYLENOL) tablet 650 mg  650 mg Oral Q6H PRN Corky Mull, MD       Or  . acetaminophen (TYLENOL) suppository 650 mg  650 mg Rectal Q6H PRN Corky Mull, MD      . aspirin EC tablet 81 mg  81 mg Oral Daily Vaughan Basta, MD   81 mg at 01/23/16 1004  . bisacodyl (DULCOLAX) suppository 10 mg  10 mg Rectal Daily PRN Corky Mull, MD      . cholecalciferol (VITAMIN D) tablet 2,000 Units  2,000 Units Oral QPM Saundra Shelling, MD   2,000 Units at 01/22/16 1824  . diphenhydrAMINE (BENADRYL) 12.5 MG/5ML elixir 12.5-25 mg  12.5-25 mg Oral Q4H PRN Corky Mull, MD      . docusate sodium (COLACE) capsule 100 mg  100 mg Oral BID Corky Mull, MD   100 mg at 01/23/16 1004  . donepezil (ARICEPT) tablet 5 mg  5 mg Oral QHS Pavan Pyreddy, MD   5 mg at 01/22/16 2109  . dutasteride (AVODART) capsule 0.5 mg  0.5 mg Oral Daily Pavan Pyreddy, MD   0.5 mg at 01/23/16 1004  . enoxaparin (LOVENOX) injection 40 mg  40 mg Subcutaneous Q24H Corky Mull, MD   40 mg at 01/23/16 0802  . folic acid (  FOLVITE) tablet 1 mg  1,000 mcg Oral Daily Saundra Shelling, MD   1 mg at 01/23/16 1004  . HYDROcodone-acetaminophen (NORCO) 7.5-325 MG per tablet 1 tablet  1 tablet Oral Q4H PRN Corky Mull, MD      . HYDROmorphone (DILAUDID) injection 1 mg  1 mg Intravenous Q3H PRN Saundra Shelling, MD      . ibuprofen (ADVIL,MOTRIN) tablet 600 mg  600 mg Oral Q6H PRN Hessie Knows, MD   600 mg at 01/22/16 1822  . lamoTRIgine (LAMICTAL) tablet 50 mg  50 mg Oral QHS Saundra Shelling, MD   50 mg at 01/22/16 2109  . lisinopril (PRINIVIL,ZESTRIL) tablet 2.5 mg  2.5 mg Oral Daily Pavan Pyreddy, MD   2.5 mg at 01/22/16 1121  . magnesium hydroxide (MILK OF MAGNESIA) suspension 30 mL  30 mL Oral Daily PRN Corky Mull, MD   30 mL at 01/22/16 2109  . magnesium oxide (MAG-OX) tablet 400 mg  400 mg Oral QPM  Saundra Shelling, MD   400 mg at 01/22/16 1824  . Melatonin TABS 10 mg  10 mg Oral QHS Vaughan Basta, MD   10 mg at 01/22/16 2111  . metoCLOPramide (REGLAN) tablet 5-10 mg  5-10 mg Oral Q8H PRN Corky Mull, MD       Or  . metoCLOPramide (REGLAN) injection 5-10 mg  5-10 mg Intravenous Q8H PRN Corky Mull, MD      . multivitamin with minerals tablet   Oral Daily Saundra Shelling, MD   1 tablet at 01/23/16 1004  . ondansetron (ZOFRAN) tablet 4 mg  4 mg Oral Q6H PRN Corky Mull, MD       Or  . ondansetron [facility]) injection 4 mg  4 mg Intravenous Q6H PRN Corky Mull, MD      . senna-docusate (Senokot-S) tablet 1 tablet  1 tablet Oral QHS PRN Saundra Shelling, MD   1 tablet at 01/22/16 2109  . sodium phosphate (FLEET) 7-19 GM/118ML enema 1 enema  1 enema Rectal Once PRN Corky Mull, MD      . trimethoprim-polymyxin b (POLYTRIM) ophthalmic solution 1 drop  1 drop Left Eye Q4H Lattie Corns, PA-C   1 drop at 01/23/16 1006     Discharge Medications: Please see discharge summary for a list of discharge medications.  Relevant Imaging Results:  Relevant Lab Results:   Additional Information  (SSN: [id number])  Sample, Veronia Beets, LCSW

## 2016-01-23 NOTE — Clinical Social Work Placement (Signed)
   CLINICAL SOCIAL WORK PLACEMENT  NOTE  Date:  01/23/2016  Patient Details  Name: Stephen Roth. MRN: [id number] Date of Birth: [birth year]  Clinical Social Work is seeking post-discharge placement for this patient at the [place] level of care (*CSW will initial, date and re-position this form in  chart as items are completed):  Yes   Patient/family provided with [place] Work Department's list of facilities offering this level of care within the geographic area requested by the patient (or if unable, by the patient's family).  Yes   Patient/family informed of their freedom to choose among providers that offer the needed level of care, that participate in Medicare, Medicaid or managed care program needed by the patient, have an available bed and are willing to accept the patient.  Yes   Patient/family informed of [facility]'s ownership interest in [facility] and [facility], as well as of the fact that they are under no obligation to receive care at these facilities.  PASRR submitted to EDS on       PASRR number received on       Existing PASRR number confirmed on 01/21/16     FL2 transmitted to all facilities in geographic area requested by pt/family on 01/21/16     FL2 transmitted to all facilities within larger geographic area on       Patient informed that his/her managed care company has contracts with or will negotiate with certain facilities, including the following:        Yes   Patient/family informed of bed offers received.  Patient chooses bed at  [facility] )     Physician recommends and patient chooses bed at      Patient to be transferred to  [facility] ) on 01/23/16.  Patient to be transferred to facility by  [facility] )     Patient family notified on 01/23/16 of transfer.  Name of family member notified:   (Patient's daughter [name] and son Barnabas Lister are aware of D/C today. )     PHYSICIAN      Additional Comment:    _______________________________________________ Maryem Shuffler, Veronia Beets, LCSW 01/23/2016, 1:35 PM

## 2016-01-23 NOTE — Progress Notes (Signed)
Physical Therapy Treatment Patient Details Name: Stephen Roth. MRN: [id number] DOB: 02-28-1925 Today's Date: 01/23/2016    History of Present Illness Pt is an 83 y.o. male presenting s/p mechanical fall sustaining displaced L femoral neck fx.  Pt s/p L hip unipolar hemiarthroplasty 01/20/16.  PMH includes CA, CHF, htn, NPH, ICD, pacemaker, B TKR.    PT Comments    Pt appearing with confusion today (pt reports he was taken to a car garage last night; nursing already aware of pt's statements and confusion) and required increased time to redirect during session initially d/t pt perseverating on last night's "event".  Pt also a little more impulsive during session and requiring 2 assist for safety d/t pt inconsistent following 1 step commands.  Will continue to progress pt with strengthening, balance, and progressing independence with functional mobility per pt tolerance.   Follow Up Recommendations  SNF     Equipment Recommendations  Rolling walker with 5" wheels    Recommendations for Other Services       Precautions / Restrictions Precautions Precautions: Posterior Hip;Fall Restrictions Weight Bearing Restrictions: Yes LLE Weight Bearing: Weight bearing as tolerated    Mobility  Bed Mobility Overal bed mobility: Needs Assistance Bed Mobility: Supine to Sit     Supine to sit: Mod assist;HOB elevated     General bed mobility comments: assist for trunk; vc's for technique and safety; use of bed rail  Transfers Overall transfer level: Needs assistance Equipment used: Rolling walker (2 wheeled) Transfers: Sit to/from Stand Sit to Stand: Min assist;Mod assist         General transfer comment: posterior lean noted with standing initially requiring physical assist and cueing to correct; vc's for hand and feet placement required  Ambulation/Gait Ambulation/Gait assistance: Min assist;Mod assist;+2 safety/equipment Ambulation Distance (Feet): 125 Feet Assistive  device: Rolling walker (2 wheeled)   Gait velocity: decreased   General Gait Details: initially taking extremely long step lengths and after that varying between short B steps and moderate step lengths requiring consistent vc's for safety, gait technique, and walker use; pt impulsive taking hand off walker at one point and pushing walker away to get over "obstacle" on ground (floor flat but briefly changed color)   Stairs            Wheelchair Mobility    Modified Rankin (Stroke Patients Only)       Balance Overall balance assessment: Needs assistance Sitting-balance support: Bilateral upper extremity supported;Feet supported Sitting balance-Leahy Scale: Fair Sitting balance - Comments: SBA for safety   Standing balance support: Bilateral upper extremity supported (on RW) Standing balance-Leahy Scale: Poor Standing balance comment: initial posterior and left lean in standing/walking requiring assist and vc's to correct (improved to occasional posterior lean with ambulation)                    Cognition Arousal/Alertness: Awake/alert Behavior During Therapy: Impulsive Overall Cognitive Status:  (Oriented to person, place, and situation but overall appearing confused (reports he was taken to a car garage last night))                 General Comments: Inconsistent with 1 step commands    Exercises Total Joint Exercises Short Arc Quad: AAROM;Strengthening;Both;10 reps;Supine Heel Slides: AAROM;Strengthening;Both;10 reps;Supine Hip ABduction/ADduction: AAROM;Strengthening;Both;10 reps;Supine  Vc's and tactile cues required for technique of above ex's.    General Comments General comments (skin integrity, edema, etc.): Pt in bed upon PT entering room (NT's just finishing cleaning  pt up).  Nursing cleared pt for participation in physical therapy (updated PT on pt's confusion prior to PT session).  Pt agreeable to PT session.       Pertinent Vitals/Pain Pain  Assessment: 0-10 Pain Score: 6  Pain Location: L hip Pain Descriptors / Indicators: Sore;Tender Pain Intervention(s): Limited activity within patient's tolerance;Monitored during session;Repositioned (RN notifed; pt declined pain meds)    Home Living                      Prior Function            PT Goals (current goals can now be found in the care plan section) Acute Rehab PT Goals Patient Stated Goal: to have less L hip pain PT Goal Formulation: With patient Time For Goal Achievement: 02/04/16 Potential to Achieve Goals: Fair Progress towards PT goals: Progressing toward goals    Frequency    BID      PT Plan Current plan remains appropriate    Co-evaluation             End of Session Equipment Utilized During Treatment: Gait belt Activity Tolerance: Patient tolerated treatment well Patient left: in chair;with call bell/phone within reach;with chair alarm set;with nursing/sitter in room;with SCD's reapplied (pillows placed between pt's knees for post THP's and B heels elevated via pillows)     Time: [id number] PT Time Calculation (min) (ACUTE ONLY): 30 min  Charges:  $Gait Training: 8-22 mins $Therapeutic Activity: 8-22 mins                    G CodesLeitha Bleak 02/17/2016, 10:25 AM Leitha Bleak, [place]

## 2016-01-23 NOTE — Discharge Summary (Signed)
[facility] [place] NAME: Stephen Roth    MR#:  [id number]  DATE OF BIRTH:  [birth year]  DATE OF ADMISSION:  01/20/2016 ADMITTING PHYSICIAN: Saundra Shelling, MD  DATE OF DISCHARGE: 01/23/2016  PRIMARY CARE PHYSICIAN: Tama High III, MD    ADMISSION DIAGNOSIS:  Closed fracture of left hip, initial encounter ([place]) [S72.002A]  DISCHARGE DIAGNOSIS:  Principal Problem:   Hip fracture ([place])   SECONDARY DIAGNOSIS:   Past Medical History:  Diagnosis Date  . Cancer ([place])    basal cell on eyelid  . CHF (congestive heart failure) ([place])   . Cirrhosis ([place])   . Hypertension   . Normal pressure hydrocephalus   . Sleep apnea     HOSPITAL COURSE:   * Left hip fracture   S/p surgery 01/20/16.   Pain meds and DVT prophylaxis as per Ortho.   PT and rehab.   lovenox for 14 days as per ortho.  * Hypertension   Lisinopril.  * dementia   Aricept.  * CHF   Currently stable.   S/p AICD.  * NPH   Rehab.  * Hypokalemia- replace.  DISCHARGE CONDITIONS:   STABLE.  CONSULTS OBTAINED:  Treatment Team:  Corky Mull, MD  DRUG ALLERGIES:   Allergies  Allergen Reactions  . Cephalexin Other (See Comments)    Confusion, hives, hallucinatons  . Clindamycin/Lincomycin Other (See Comments)    Confusion, hives, hallucinations  . Morphine And Related Other (See Comments)    DISCHARGE MEDICATIONS:   Current Discharge Medication List    START taking these medications   Details  bisacodyl (DULCOLAX) 10 MG suppository Place 1 suppository (10 mg total) rectally daily as needed for moderate constipation. Qty: 12 suppository, Refills: 0    enoxaparin (LOVENOX) 40 MG/0.4ML injection Inject 0.4 mLs (40 mg total) into the skin daily. Qty: 14 Syringe, Refills: 0    HYDROcodone-acetaminophen (NORCO) 7.5-325 MG tablet Take 1 tablet by mouth every 6 (six) hours as needed for moderate pain. Qty: 30 tablet, Refills: 0     senna-docusate (SENOKOT-S) 8.6-50 MG tablet Take 1 tablet by mouth at bedtime as needed for mild constipation. Qty: 20 tablet, Refills: 0    trimethoprim-polymyxin b (POLYTRIM) ophthalmic solution Place 1 drop into the left eye every 4 (four) hours. Qty: 10 mL, Refills: 0      CONTINUE these medications which have CHANGED   Details  !! lamoTRIgine (LAMICTAL) 25 MG tablet Take 2 tablets (50 mg total) by mouth at bedtime. Qty: 30 tablet, Refills: 0     !! - Potential duplicate medications found. Please discuss with provider.    CONTINUE these medications which have NOT CHANGED   Details  aspirin EC 81 MG tablet Take 81 mg by mouth daily.    Cholecalciferol (VITAMIN D-3 PO) Take 2,000 Units by mouth every evening.     Docusate Calcium (STOOL SOFTENER PO) Take 1 tablet by mouth 2 (two) times daily.    donepezil (ARICEPT) 5 MG tablet Take 5 mg by mouth at bedtime.    dutasteride (AVODART) 0.5 MG capsule Take 0.5 mg by mouth daily.    folic acid (FOLVITE) A999333 MCG tablet Take 400 mcg by mouth daily.    !! lamoTRIgine (LAMICTAL) 25 MG tablet Take 25 mg by mouth at bedtime.     lisinopril (PRINIVIL,ZESTRIL) 2.5 MG tablet Take 2.5 mg by mouth daily.    Magnesium 500 MG CAPS Take 500 mg by mouth every  evening.     Melatonin 10 MG TABS Take 10 mg by mouth at bedtime.    Misc Natural Products (LUTEIN 20 PO) Take 1 capsule by mouth every evening.    Multiple Vitamins-Minerals (CENTRUM SILVER PO) Take 1 tablet by mouth daily.    Specialty Vitamins Products (BILBERRY PLUS PO) Take 1 tablet by mouth daily.     !! - Potential duplicate medications found. Please discuss with provider.       DISCHARGE INSTRUCTIONS:    Follow with ortho clinic in 3-4 weeks.  If you experience worsening of your admission symptoms, develop shortness of breath, life threatening emergency, suicidal or homicidal thoughts you must seek medical attention immediately by calling 911 or calling your MD  immediately  if symptoms less severe.  You Must read complete instructions/literature along with all the possible adverse reactions/side effects for all the Medicines you take and that have been prescribed to you. Take any new Medicines after you have completely understood and accept all the possible adverse reactions/side effects.   Please note  You were cared for by a hospitalist during your hospital stay. If you have any questions about your discharge medications or the care you received while you were in the hospital after you are discharged, you can call the unit and asked to speak with the hospitalist on call if the hospitalist that took care of you is not available. Once you are discharged, your primary care physician will handle any further medical issues. Please note that NO REFILLS for any discharge medications will be authorized once you are discharged, as it is imperative that you return to your primary care physician (or establish a relationship with a primary care physician if you do not have one) for your aftercare needs so that they can reassess your need for medications and monitor your lab values.    Today   CHIEF COMPLAINT:   Chief Complaint  Patient presents with  . Fall  . Hip Pain    HISTORY OF PRESENT ILLNESS:  Stephen Roth  is a 83 y.o. male with a known history of Basal cell carcinoma, congestive heart failure, cirrhosis of liver, hypertension, normal pressure hydrocephalus, sleep apnea presented to the emergency room with left hip pain. Patient tripped and fell yesterday evening around 8:30 PM at home in the living room. No loss of consciousness or head injury. Patient initially got up and move around and watched a ball game on the television. Later in the middle of the night around 3 AM he experienced more pain in the left hip area. The pain in the left hip is aching in nature 9 out of 10. His son-in-law called the EMS and patient was brought to the emergency room.  Imaging studies in the emergency room showed a left femoral neck fracture. No complaints of any chest pain, shortness of breath. No headache, dizziness and blurry vision. Case was discussed with orthopedic surgeon on-call by ER physician and patient will be kept nothing by mouth for now for surgery later later part of the day.   VITAL SIGNS:  Blood pressure 118/69, pulse 81, temperature 97.3 F (36.3 C), temperature source Oral, resp. rate 18, height 5\' 10"  (1.778 m), weight 82.1 kg (181 lb), SpO2 95 %.  I/O:    Intake/Output Summary (Last 24 hours) at 01/23/16 1056 Last data filed at 01/23/16 0650  Gross per 24 hour  Intake                0  ml  Output              375 ml  Net             -375 ml    PHYSICAL EXAMINATION:   GENERAL:  83 y.o.-year-old patient lying in the bed with no acute distress.  EYES: Pupils equal, round, reactive to light and accommodation. No scleral icterus. Extraocular muscles intact.  HEENT: Head atraumatic, normocephalic. Oropharynx and nasopharynx clear.  NECK:  Supple, no jugular venous distention. No thyroid enlargement, no tenderness.  LUNGS: Normal breath sounds bilaterally, no wheezing, rales,rhonchi or crepitation. No use of accessory muscles of respiration.  CARDIOVASCULAR: S1, S2 normal. No murmurs, rubs, or gallops.  ABDOMEN: Soft, nontender, nondistended. Bowel sounds present. No organomegaly or mass.  EXTREMITIES: No pedal edema, cyanosis, or clubbing. Left hip dressing. NEUROLOGIC: Cranial nerves II through XII are intact. Muscle strength 5/5 in all extremities. Sensation intact. Gait not checked.  PSYCHIATRIC: The patient is alert and oriented x 3.  SKIN: No obvious rash, lesion, or ulcer.   DATA REVIEW:   CBC  Recent Labs Lab 01/23/16 0516  WBC 6.8  HGB 12.1*  HCT 34.8*  PLT 219    Chemistries   Recent Labs Lab 01/20/16 0340  01/23/16 0516  NA 141  < > 141  K 3.4*  < > 3.8  CL 103  < > 109  CO2 31  < > 27  GLUCOSE 131*   < > 89  BUN 22*  < > 14  CREATININE 0.94  < > 0.71  CALCIUM 9.0  < > 8.3*  AST 77*  --   --   ALT 59  --   --   ALKPHOS 48  --   --   BILITOT 1.1  --   --   < > = values in this interval not displayed.  Cardiac Enzymes No results for input(s): TROPONINI in the last 168 hours.  Microbiology Results  Results for orders placed or performed during the hospital encounter of 01/20/16  Surgical PCR screen     Status: None   Collection Time: 01/20/16  8:02 AM  Result Value Ref Range Status   MRSA, PCR NEGATIVE NEGATIVE Final   Staphylococcus aureus NEGATIVE NEGATIVE Final    Comment:        The Xpert SA Assay (FDA approved for NASAL specimens in patients over 23 years of age), is one component of a comprehensive surveillance program.  Test performance has been validated by [facility] for patients greater than or equal to 16 year old. It is not intended to diagnose infection nor to guide or monitor treatment.     RADIOLOGY:  No results found.  EKG:   Orders placed or performed during the hospital encounter of 01/20/16  . ED EKG  . ED EKG  . EKG 12-Lead  . EKG 12-Lead      Management plans discussed with the patient, family and they are in agreement.  CODE STATUS:     Code Status Orders        Start     Ordered   01/20/16 0716  Full code  Continuous     01/20/16 0715    Code Status History    Date Active Date Inactive Code Status Order ID Comments User Context   08/10/2015  9:17 PM 08/11/2015  3:39 PM Full Code [id number]  Henreitta Leber, MD Inpatient    Advance Directive Documentation   Flowsheet Row Most  Recent Value  Type of Advance Directive  Healthcare Power of Attorney  Pre-existing out of facility DNR order (yellow form or pink MOST form)  No data  "MOST" Form in Place?  No data      TOTAL TIME TAKING CARE OF THIS PATIENT: 35 minutes.    Vaughan Basta M.D on 01/23/2016 at 10:56 AM  Between 7am to 6pm - Pager - [phone]  After  6pm go to [email or web address] - password [email or web address] [facility]  Office  [phone]  CC: Primary care physician; Adin Hector, MD   Note: This dictation was prepared with Dragon dictation along with smaller phrase technology. Any transcriptional errors that result from this process are unintentional.

## 2016-01-23 NOTE — Progress Notes (Signed)
Subjective: 3 Days Post-Op Procedure(s) (LRB): ARTHROPLASTY BIPOLAR HIP (HEMIARTHROPLASTY) (Left) Patient reports pain as mild.   Upon awakening, signs of left conjunctivitis. Patient is well, and has had no acute complaints or problems Plan is to go Skilled nursing facility after hospital stay. Negative for chest pain and shortness of breath Fever: no Gastrointestinal:Negative for nausea and vomiting  Objective: Vital signs in last 24 hours: Temp:  [97.8 F (36.6 C)-98.4 F (36.9 C)] 97.8 F (36.6 C) (01/10 0428) Pulse Rate:  [56-67] 64 (01/10 0428) Resp:  [16-18] 18 (01/10 0428) BP: (103-117)/(59-72) 103/59 (01/10 0428) SpO2:  [94 %-97 %] 97 % (01/10 0428)  Intake/Output from previous day:  Intake/Output Summary (Last 24 hours) at 01/23/16 0747 Last data filed at 01/23/16 0650  Gross per 24 hour  Intake              240 ml  Output              575 ml  Net             -335 ml    Intake/Output this shift: No intake/output data recorded.  Labs:  Recent Labs  01/21/16 0406 01/23/16 0516  HGB 12.0* 12.1*    Recent Labs  01/21/16 0406 01/23/16 0516  WBC 5.9 6.8  RBC 3.65* 3.63*  HCT 34.8* 34.8*  PLT 178 219    Recent Labs  01/22/16 0600 01/23/16 0516  NA 139 141  K 3.8 3.8  CL 110 109  CO2 26 27  BUN 13 14  CREATININE 0.70 0.71  GLUCOSE 90 89  CALCIUM 8.5* 8.3*   No results for input(s): LABPT, INR in the last 72 hours.   EXAM General - Patient is Alert and Lacking.  Patient knows that he is at [facility], but cannot remember undergoing therapy yesterday and other details. Extremity - ABD soft Sensation intact distally Dorsiflexion/Plantar flexion intact Incision: dressing C/D/I No cellulitis present Dressing/Incision - clean, dry, no drainage Motor Function - intact, moving foot and toes well on exam.  Signs of bacterial conjunctivitis, injected conjunctiva.  Purulent drainage.  Past Medical History:  Diagnosis Date  . Cancer ([place])    basal  cell on eyelid  . CHF (congestive heart failure) ([place])   . Cirrhosis ([place])   . Hypertension   . Normal pressure hydrocephalus   . Sleep apnea     Assessment/Plan: 3 Days Post-Op Procedure(s) (LRB): ARTHROPLASTY BIPOLAR HIP (HEMIARTHROPLASTY) (Left) Principal Problem:   Hip fracture ([facility])  Estimated body mass index is 25.97 kg/m as calculated from the following:   Height as of this encounter: 5\' 10"  (1.778 m).   Weight as of this encounter: 82.1 kg (181 lb). Advance diet Up with therapy  Labs reviewed, Hypokalemia resolved. Continue with therapy, Plan will be for discharge to SNF today. No documented BM, however patient reports a BM. Will begin Abx eye drops for left eye.  Orthopaedically stable for discharge. Continue Lovenox 40mg  daily x 14 days. Staples can be removed on 02/01/16.  Foloow-up with [facility] in 6 weeks.  DVT Prophylaxis - Lovenox, Foot Pumps and TED hose Weight-Bearing as tolerated to left leg  J. Cameron Proud, PA-C [facility] Orthopaedic Surgery 01/23/2016, 7:47 AM

## 2016-01-23 NOTE — Progress Notes (Signed)
Patient is medically stable for D/C to [facility] today. Per [facility] admissions coordinator at [facility] patient will go to room 303-B. Per Christie Nottingham authorization has been received. RN will call report at [phone] and arrange EMS for transport. Clinical Education officer, museum (CSW) sent D/C orders to [employer] via [place]. CSW contacted patient's daughter Stephen Roth and made her aware of above. Per daughter patient has his own Cpap in [facility] room at [facility] that will go with him to [place]. RN aware of Cpap. CSW also contacted patient's son Stephen Roth and him aware of above. Please reconsult if future social work needs arise. CSW signing off.   [employer], LCSW [phone]

## 2016-01-23 NOTE — Progress Notes (Signed)
Pt left at this time via EMS. Pt's son arrived prior to EMS and took pt's cpap and belongings, stating he would take them to [facility] later tonight.

## 2016-01-23 NOTE — Progress Notes (Signed)
Shift assessment completed at 0800. Pt is awake, alert to self only, is irritated and grumpy, states he has had terrible care. Pt is on room air, lungs are clear bilat, HR is regular, abdomen is soft, bs heard. L hip has honeycomb dressing in place, dry. Pt is wearing incontinence brief,ppp, no edema noted. PIV #20 intact to l arm ,site free of redness and swelling. After this writer left the room, pt immediately called out for bathroom help, had stool via bedpan, had to be redirected from getting oob unassisted. Since assessment, pt confusion has cleared so that he remembers where he is for a short while, but pt remains impulsive, requiring at times nearly constant staff attention. This Probation officer called [facility] and gave report, pt is ready and waiting ems.

## 2016-01-23 NOTE — Plan of Care (Signed)
Problem: Skin Integrity: Goal: Signs of wound healing will improve Outcome: Adequate for Discharge Pt is ready for discharge.

## 2016-01-30 DIAGNOSIS — R05 Cough: Secondary | ICD-10-CM | POA: Diagnosis not present

## 2016-01-30 DIAGNOSIS — R350 Frequency of micturition: Secondary | ICD-10-CM | POA: Diagnosis not present

## 2016-01-30 LAB — URINALYSIS, COMPLETE (UACMP) WITH MICROSCOPIC
BILIRUBIN URINE: NEGATIVE
Bacteria, UA: NONE SEEN
GLUCOSE, UA: NEGATIVE mg/dL
KETONES UR: NEGATIVE mg/dL
LEUKOCYTES UA: NEGATIVE
Nitrite: NEGATIVE
PH: 7 (ref 5.0–8.0)
Protein, ur: NEGATIVE mg/dL
Specific Gravity, Urine: 1.01 (ref 1.005–1.030)

## 2016-01-31 LAB — URINE CULTURE: Culture: NO GROWTH

## 2016-02-08 DIAGNOSIS — R05 Cough: Secondary | ICD-10-CM | POA: Diagnosis not present

## 2016-02-08 LAB — INFLUENZA PANEL BY PCR (TYPE A & B)
INFLAPCR: POSITIVE — AB
INFLBPCR: NEGATIVE

## 2016-02-11 ENCOUNTER — Non-Acute Institutional Stay (SKILLED_NURSING_FACILITY): Payer: Medicare HMO | Admitting: Gerontology

## 2016-02-11 DIAGNOSIS — J101 Influenza due to other identified influenza virus with other respiratory manifestations: Secondary | ICD-10-CM | POA: Diagnosis not present

## 2016-02-13 ENCOUNTER — Non-Acute Institutional Stay (SKILLED_NURSING_FACILITY): Payer: Medicare HMO | Admitting: Gerontology

## 2016-02-13 DIAGNOSIS — S72002D Fracture of unspecified part of neck of left femur, subsequent encounter for closed fracture with routine healing: Secondary | ICD-10-CM | POA: Diagnosis not present

## 2016-02-14 ENCOUNTER — Encounter: Admission: RE | Admit: 2016-02-14 | Payer: Medicare HMO | Source: Ambulatory Visit | Admitting: Internal Medicine

## 2016-02-25 NOTE — Progress Notes (Addendum)
Location:      Place of Service:  SNF (31)  Provider: Toni Arthurs, NP-C  PCP: Adin Hector, MD Patient Care Team: Adin Hector, MD as PCP - General (Internal Medicine)  Extended Emergency Contact Information Primary Emergency Contact: Kempfer,Mary J Address: [address]          [place], [place] [address] Johnnette Litter of [place] Phone: [phone] Mobile Phone: [phone] Relation: Spouse Secondary Emergency Contact: Collins,Lynn  [place] Phone: [phone] Relation: Daughter  Code Status: full Goals of care:  Advanced Directive information Advanced Directives 01/21/2016  Does Patient Have a Medical Advance Directive? Yes  Type of Advance Directive [place]  Does patient want to make changes to medical advance directive? No - Patient declined  Copy of [place] in Chart? No - copy requested     Allergies  Allergen Reactions  . Cephalexin Other (See Comments)    Confusion, hives, hallucinatons  . Clindamycin/Lincomycin Other (See Comments)    Confusion, hives, hallucinations  . Morphine And Related Other (See Comments)    Chief Complaint  Patient presents with  . Acute Visit    HPI:  83 y.o. male seen today for an acute visit. Pt was admitted to the facility for rehab following hospitalization for left hip fracture with surgical intervention. Pt has been working with PT and OT.Over the past few days, pt has c/o increased coughing/congestion, malaise/lethargy. Flu swab was obtained as influenza is prevalent right now in the facility. Swab was positive for Influenza A. Pt will be given treatment course of Tamiflu for treatment of Influenza. Pt does have ST memory issues. Pt reports pain is very well controlled. Pt denies n/v/d/f/c/cp/sob/ha/abd pain/dizziness. VSS. No other complaints. Pt is to discharge to [facility] next week if influenza is resolved.      Past Medical  History:  Diagnosis Date  . Cancer ([place])    basal cell on eyelid  . CHF (congestive heart failure) ([place])   . Cirrhosis ([place])   . Hypertension   . Normal pressure hydrocephalus   . Sleep apnea     Past Surgical History:  Procedure Laterality Date  . HERNIA REPAIR    . HIP ARTHROPLASTY Left 01/20/2016   Procedure: ARTHROPLASTY BIPOLAR HIP (HEMIARTHROPLASTY);  Surgeon: Corky Mull, MD;  Location: [facility] ORS;  Service: Orthopedics;  Laterality: Left;  . IMPLANTABLE CARDIOVERTER DEFIBRILLATOR (ICD) GENERATOR CHANGE Left 07/13/2015   Procedure: ICD GENERATOR CHANGE;  Surgeon: Marzetta Board, MD;  Location: [facility] ORS;  Service: Cardiovascular;  Laterality: Left;  . JOINT REPLACEMENT Bilateral    knees  . PACEMAKER INSERTION  [address] maker, defibulator      reports that he has never smoked. He has never used smokeless tobacco. He reports that he does not drink alcohol or use drugs. Social History   Social History  . Marital status: Married    Spouse name: N/A  . Number of children: N/A  . Years of education: N/A   Occupational History  . retired    Social History Main Topics  . Smoking status: Never Smoker  . Smokeless tobacco: Never Used  . Alcohol use No  . Drug use: No  . Sexual activity: Not on file   Other Topics Concern  . Not on file   Social History Narrative  . No narrative on file   Functional Status Survey:    Allergies  Allergen Reactions  .  Cephalexin Other (See Comments)    Confusion, hives, hallucinatons  . Clindamycin/Lincomycin Other (See Comments)    Confusion, hives, hallucinations  . Morphine And Related Other (See Comments)    There are no preventive care reminders to display for this patient.  Medications: Allergies as of 02/11/2016      Reactions   Cephalexin Other (See Comments)   Confusion, hives, hallucinatons   Clindamycin/lincomycin Other (See Comments)   Confusion, hives, hallucinations   Morphine And Related Other (See  Comments)      Medication List       Accurate as of 02/11/16 11:59 PM. Always use your most recent med list.          aspirin EC 81 MG tablet Take 81 mg by mouth daily.   BILBERRY PLUS PO Take 1 tablet by mouth daily.   bisacodyl 10 MG suppository Commonly known as:  DULCOLAX Place 1 suppository (10 mg total) rectally daily as needed for moderate constipation.   CENTRUM SILVER PO Take 1 tablet by mouth daily.   donepezil 5 MG tablet Commonly known as:  ARICEPT Take 5 mg by mouth at bedtime.   dutasteride 0.5 MG capsule Commonly known as:  AVODART Take 0.5 mg by mouth daily.   enoxaparin 40 MG/0.4ML injection Commonly known as:  LOVENOX Inject 0.4 mLs (40 mg total) into the skin daily.   folic acid A999333 MCG tablet Commonly known as:  FOLVITE Take 400 mcg by mouth daily.   HYDROcodone-acetaminophen 7.5-325 MG tablet Commonly known as:  NORCO Take 1 tablet by mouth every 6 (six) hours as needed for moderate pain.   lamoTRIgine 25 MG tablet Commonly known as:  LAMICTAL Take 25 mg by mouth at bedtime.   lamoTRIgine 25 MG tablet Commonly known as:  LAMICTAL Take 2 tablets (50 mg total) by mouth at bedtime.   lisinopril 2.5 MG tablet Commonly known as:  PRINIVIL,ZESTRIL Take 2.5 mg by mouth daily.   LUTEIN 20 PO Take 1 capsule by mouth every evening.   Magnesium 500 MG Caps Take 500 mg by mouth every evening.   Melatonin 10 MG Tabs Take 10 mg by mouth at bedtime.   senna-docusate 8.6-50 MG tablet Commonly known as:  Senokot-S Take 1 tablet by mouth at bedtime as needed for mild constipation.   STOOL SOFTENER PO Take 1 tablet by mouth 2 (two) times daily.   trimethoprim-polymyxin b ophthalmic solution Commonly known as:  POLYTRIM Place 1 drop into the left eye every 4 (four) hours.   VITAMIN D-3 PO Take 2,000 Units by mouth every evening.       Review of Systems  Constitutional: Negative for activity change, appetite change, chills, diaphoresis  and fever.  HENT: Negative for congestion, sneezing, sore throat, trouble swallowing and voice change.   Eyes: Negative for pain, redness and visual disturbance.  Respiratory: Negative for apnea, cough, choking, chest tightness, shortness of breath and wheezing.   Cardiovascular: Negative for chest pain, palpitations and leg swelling.  Gastrointestinal: Negative for abdominal distention, abdominal pain, constipation, diarrhea and nausea.  Genitourinary: Negative for difficulty urinating, dysuria, frequency and urgency.  Musculoskeletal: Positive for arthralgias (typical arthritis). Negative for back pain, gait problem and myalgias.  Skin: Positive for wound. Negative for color change, pallor and rash.  Neurological: Negative for dizziness, tremors, syncope, speech difficulty, weakness, numbness and headaches.  Psychiatric/Behavioral: Negative for agitation and behavioral problems.  All other systems reviewed and are negative.   Vitals:   02/04/16 1200  BP:  97/68  Pulse: 74  Resp: 18  Temp: 97.7 F (36.5 C)  SpO2: 97%   There is no height or weight on file to calculate BMI. Physical Exam  Constitutional: He is oriented to person, place, and time. Vital signs are normal. He appears well-developed and well-nourished. He is active and cooperative. He does not appear ill. No distress.  HENT:  Head: Normocephalic and atraumatic.  Mouth/Throat: Uvula is midline, oropharynx is clear and moist and mucous membranes are normal. Mucous membranes are not pale, not dry and not cyanotic.  Eyes: Conjunctivae, EOM and lids are normal. Pupils are equal, round, and reactive to light.  Neck: Trachea normal, normal range of motion and full passive range of motion without pain. Neck supple. No JVD present. No tracheal deviation, no edema and no erythema present. No thyromegaly present.  Cardiovascular: Normal rate, regular rhythm, normal heart sounds, intact distal pulses and normal pulses.  Exam reveals no  gallop, no distant heart sounds and no friction rub.   No murmur heard. Pulmonary/Chest: Effort normal and breath sounds normal. No accessory muscle usage. No respiratory distress. He has no wheezes. He has no rales. He exhibits no tenderness.  Abdominal: Normal appearance and bowel sounds are normal. He exhibits no distension and no ascites. There is no tenderness.  Musculoskeletal: He exhibits no edema.       Left hip: He exhibits decreased range of motion, decreased strength and tenderness.  Expected osteoarthritis, stiffness; calves soft, supple. Negative Homan's sign  Neurological: He is alert and oriented to person, place, and time. He has normal strength.  Skin: Skin is warm and dry. Laceration (left hip incision) noted. No rash noted. He is not diaphoretic. No cyanosis or erythema. No pallor. Nails show no clubbing.  Psychiatric: He has a normal mood and affect. His speech is normal and behavior is normal. Judgment and thought content normal. Cognition and memory are normal.  Nursing note and vitals reviewed.   Labs reviewed: Basic Metabolic Panel:  Recent Labs  01/21/16 0406 01/22/16 0600 01/23/16 0516  NA 139 139 141  K 3.4* 3.8 3.8  CL 111 110 109  CO2 26 26 27   GLUCOSE 107* 90 89  BUN 13 13 14   CREATININE 0.76 0.70 0.71  CALCIUM 8.2* 8.5* 8.3*   Liver Function Tests:  Recent Labs  08/10/15 1618 01/20/16 0340  AST 26 77*  ALT 20 59  ALKPHOS 50 48  BILITOT 1.0 1.1  PROT 6.0* 6.7  ALBUMIN 3.8 3.7   No results for input(s): LIPASE, AMYLASE in the last 8760 hours. No results for input(s): AMMONIA in the last 8760 hours. CBC:  Recent Labs  06/18/15 1214  01/20/16 0340 01/21/16 0406 01/23/16 0516  WBC 5.7  < > 11.1* 5.9 6.8  NEUTROABS 4.3  --  9.6*  --   --   HGB 13.9  < > 14.9 12.0* 12.1*  HCT 41.9  < > 43.1 34.8* 34.8*  MCV 98.3  < > 94.4 95.4 95.8  PLT 228  < > 210 178 219  < > = values in this interval not displayed. Cardiac Enzymes: No results  for input(s): CKTOTAL, CKMB, CKMBINDEX, TROPONINI in the last 8760 hours. BNP: Invalid input(s): POCBNP CBG: No results for input(s): GLUCAP in the last 8760 hours.  Procedures and Imaging Studies During Stay: No results found.  Assessment/Plan:   1. Influenza A  Tamiflu 75 mg po BID x 5 days  Encourage PO fluid intake  Droplet Precautions  Encourage good hand hygiene   Labs/tests ordered: Flu swab  Family/ staff Communication:   Total Time:  Documentation:  Face to Face:  Family/Phone:  Vikki Ports, NP-C [facility] [address], [place] [address] Cell Phone (Mon-Fri 8am-5pm):  [phone] On Call:  [phone] & follow prompts after 5pm & weekends Office Phone:  [phone] Office Fax:  [phone]

## 2016-02-25 NOTE — Progress Notes (Signed)
Location:    Place of Service:  SNF (31)  Provider: Toni Arthurs, NP-C  PCP: Adin Hector, MD Patient Care Team: Adin Hector, MD as PCP - General (Internal Medicine)  Extended Emergency Contact Information Primary Emergency Contact: Gloor,Mary J Address: [address]          [place], [place] [address] Johnnette Litter of [place] Phone: [phone] Mobile Phone: [phone] Relation: Spouse Secondary Emergency Contact: Collins,Lynn  [place] Phone: [phone] Relation: Daughter  Code Status: full Goals of care:  Advanced Directive information Advanced Directives 01/21/2016  Does Patient Have a Medical Advance Directive? Yes  Type of Advance Directive [place]  Does patient want to make changes to medical advance directive? No - Patient declined  Copy of [place] in Chart? No - copy requested          Allergies  Allergen Reactions  . Cephalexin Other (See Comments)    Confusion, hives, hallucinatons  . Clindamycin/Lincomycin Other (See Comments)    Confusion, hives, hallucinations  . Morphine And Related Other (See Comments)       Chief Complaint  Patient presents with  . Discharge Note    HPI:  83 y.o. male seen today for discharge evaluation. Pt was admitted to the facility for rehab following hospitalization for left hip fracture with surgical intervention. Pt has been working with PT and OT. While in the facility, pt contracted Influenza A. Pt recovered with minimal symptoms. Pt completed course of Tamiflu for treatment of Influenza. Pt does have ST memory issues. Pt reports pain is very well controlled. Pt denies n/v/d/f/c/cp/sob/ha/abd pain/dizziness. Pt reports he is feeling well and ready to discharge. VSS. No other complaints. Pt is to discharge to [facility].          Past Medical History:  Diagnosis Date  . Cancer ([place])    basal cell on  eyelid  . CHF (congestive heart failure) ([place])   . Cirrhosis ([place])   . Hypertension   . Normal pressure hydrocephalus   . Sleep apnea          Past Surgical History:  Procedure Laterality Date  . HERNIA REPAIR    . HIP ARTHROPLASTY Left 01/20/2016   Procedure: ARTHROPLASTY BIPOLAR HIP (HEMIARTHROPLASTY);  Surgeon: Corky Mull, MD;  Location: [facility] ORS;  Service: Orthopedics;  Laterality: Left;  . IMPLANTABLE CARDIOVERTER DEFIBRILLATOR (ICD) GENERATOR CHANGE Left 07/13/2015   Procedure: ICD GENERATOR CHANGE;  Surgeon: Marzetta Board, MD;  Location: [facility] ORS;  Service: Cardiovascular;  Laterality: Left;  . JOINT REPLACEMENT Bilateral    knees  . PACEMAKER INSERTION  [address] maker, defibulator      reports that he has never smoked. He has never used smokeless tobacco. He reports that he does not drink alcohol or use drugs. Social History        Social History  . Marital status: Married    Spouse name: N/A  . Number of children: N/A  . Years of education: N/A       Occupational History  . retired        Social History Main Topics  . Smoking status: Never Smoker  . Smokeless tobacco: Never Used  . Alcohol use No  . Drug use: No  . Sexual activity: Not on file       Other Topics Concern  . Not on file      Social History  Narrative  . No narrative on file   Functional Status Survey:       Allergies  Allergen Reactions  . Cephalexin Other (See Comments)    Confusion, hives, hallucinatons  . Clindamycin/Lincomycin Other (See Comments)    Confusion, hives, hallucinations  . Morphine And Related Other (See Comments)    There are no preventive care reminders to display for this patient.  Medications:     Allergies as of 02/11/2016      Reactions   Cephalexin Other (See Comments)   Confusion, hives, hallucinatons   Clindamycin/lincomycin Other (See Comments)   Confusion, hives, hallucinations   Morphine And Related  Other (See Comments)               Medication List           Accurate as of 02/11/16 11:59 PM. Always use your most recent med list.           aspirin EC 81 MG tablet Take 81 mg by mouth daily.   BILBERRY PLUS PO Take 1 tablet by mouth daily.   bisacodyl 10 MG suppository Commonly known as:  DULCOLAX Place 1 suppository (10 mg total) rectally daily as needed for moderate constipation.   CENTRUM SILVER PO Take 1 tablet by mouth daily.   donepezil 5 MG tablet Commonly known as:  ARICEPT Take 5 mg by mouth at bedtime.   dutasteride 0.5 MG capsule Commonly known as:  AVODART Take 0.5 mg by mouth daily.   enoxaparin 40 MG/0.4ML injection Commonly known as:  LOVENOX Inject 0.4 mLs (40 mg total) into the skin daily.   folic acid A999333 MCG tablet Commonly known as:  FOLVITE Take 400 mcg by mouth daily.   HYDROcodone-acetaminophen 7.5-325 MG tablet Commonly known as:  NORCO Take 1 tablet by mouth every 6 (six) hours as needed for moderate pain.   lamoTRIgine 25 MG tablet Commonly known as:  LAMICTAL Take 25 mg by mouth at bedtime.   lamoTRIgine 25 MG tablet Commonly known as:  LAMICTAL Take 2 tablets (50 mg total) by mouth at bedtime.   lisinopril 2.5 MG tablet Commonly known as:  PRINIVIL,ZESTRIL Take 2.5 mg by mouth daily.   LUTEIN 20 PO Take 1 capsule by mouth every evening.   Magnesium 500 MG Caps Take 500 mg by mouth every evening.   Melatonin 10 MG Tabs Take 10 mg by mouth at bedtime.   senna-docusate 8.6-50 MG tablet Commonly known as:  Senokot-S Take 1 tablet by mouth at bedtime as needed for mild constipation.   STOOL SOFTENER PO Take 1 tablet by mouth 2 (two) times daily.   trimethoprim-polymyxin b ophthalmic solution Commonly known as:  POLYTRIM Place 1 drop into the left eye every 4 (four) hours.   VITAMIN D-3 PO Take 2,000 Units by mouth every evening.       Review of Systems  Constitutional: Negative  for activity change, appetite change, chills, diaphoresis and fever.  HENT: Negative for congestion, sneezing, sore throat, trouble swallowing and voice change.   Eyes: Negative for pain, redness and visual disturbance.  Respiratory: Negative for apnea, cough, choking, chest tightness, shortness of breath and wheezing.   Cardiovascular: Negative for chest pain, palpitations and leg swelling.  Gastrointestinal: Negative for abdominal distention, abdominal pain, constipation, diarrhea and nausea.  Genitourinary: Negative for difficulty urinating, dysuria, frequency and urgency.  Musculoskeletal: Positive for arthralgias (typical arthritis). Negative for back pain, gait problem and myalgias.  Skin: Positive for wound. Negative for color  change, pallor and rash.  Neurological: Negative for dizziness, tremors, syncope, speech difficulty, weakness, numbness and headaches.  Psychiatric/Behavioral: Negative for agitation and behavioral problems.  All other systems reviewed and are negative.      Vitals:   02/04/16 1200  BP: 97/68  Pulse: 74  Resp: 18  Temp: 97.7 F (36.5 C)  SpO2: 97%   There is no height or weight on file to calculate BMI. Physical Exam  Constitutional: He is oriented to person, place, and time. Vital signs are normal. He appears well-developed and well-nourished. He is active and cooperative. He does not appear ill. No distress.  HENT:  Head: Normocephalic and atraumatic.  Mouth/Throat: Uvula is midline, oropharynx is clear and moist and mucous membranes are normal. Mucous membranes are not pale, not dry and not cyanotic.  Eyes: Conjunctivae, EOM and lids are normal. Pupils are equal, round, and reactive to light.  Neck: Trachea normal, normal range of motion and full passive range of motion without pain. Neck supple. No JVD present. No tracheal deviation, no edema and no erythema present. No thyromegaly present.  Cardiovascular: Normal rate, regular rhythm, normal  heart sounds, intact distal pulses and normal pulses.  Exam reveals no gallop, no distant heart sounds and no friction rub.   No murmur heard. Pulmonary/Chest: Effort normal and breath sounds normal. No accessory muscle usage. No respiratory distress. He has no wheezes. He has no rales. He exhibits no tenderness.  Abdominal: Normal appearance and bowel sounds are normal. He exhibits no distension and no ascites. There is no tenderness.  Musculoskeletal: He exhibits no edema.       Left hip: He exhibits decreased range of motion, decreased strength and tenderness.  Expected osteoarthritis, stiffness; calves soft, supple. Negative Homan's sign  Neurological: He is alert and oriented to person, place, and time. He has normal strength.  Skin: Skin is warm and dry. Laceration (left hip incision) noted. No rash noted. He is not diaphoretic. No cyanosis or erythema. No pallor. Nails show no clubbing.  Psychiatric: He has a normal mood and affect. His speech is normal and behavior is normal. Judgment and thought content normal. Cognition and memory are normal.  Nursing note and vitals reviewed.   Labs reviewed: Basic Metabolic Panel:  Recent Labs (within last 365 days)   Recent Labs  01/21/16 0406 01/22/16 0600 01/23/16 0516  NA 139 139 141  K 3.4* 3.8 3.8  CL 111 110 109  CO2 26 26 27   GLUCOSE 107* 90 89  BUN 13 13 14   CREATININE 0.76 0.70 0.71  CALCIUM 8.2* 8.5* 8.3*     Liver Function Tests:  Recent Labs (within last 365 days)   Recent Labs  08/10/15 1618 01/20/16 0340  AST 26 77*  ALT 20 59  ALKPHOS 50 48  BILITOT 1.0 1.1  PROT 6.0* 6.7  ALBUMIN 3.8 3.7     Recent Labs (within last 365 days)  No results for input(s): LIPASE, AMYLASE in the last 8760 hours.   Recent Labs (within last 365 days)  No results for input(s): AMMONIA in the last 8760 hours.   CBC:  Recent Labs (within last 365 days)   Recent Labs  06/18/15 1214  01/20/16 0340  01/21/16 0406 01/23/16 0516  WBC 5.7  < > 11.1* 5.9 6.8  NEUTROABS 4.3  --  9.6*  --   --   HGB 13.9  < > 14.9 12.0* 12.1*  HCT 41.9  < > 43.1 34.8* 34.8*  MCV  98.3  < > 94.4 95.4 95.8  PLT 228  < > 210 178 219  < > = values in this interval not displayed.   Cardiac Enzymes: Recent Labs (within last 365 days)  No results for input(s): CKTOTAL, CKMB, CKMBINDEX, TROPONINI in the last 8760 hours.   BNP: Recent Labs (within last 365 days)  Invalid input(s): POCBNP   CBG: Recent Labs (within last 365 days)  No results for input(s): GLUCAP in the last 8760 hours.    Procedures and Imaging Studies During Stay: Imaging Results  No results found.    Assessment/Plan:   1. Closed fracture of left hip with routine healing, subsequent encounter  [facility] PT/OT  Continue exercises as taught by PT/OT  Follow up with PCP and Orthopedist asap after discharge for continuity of care  Hydrocodone 7.5/325 mg 1 tablet po Q 6 hours prn pain  Continue complementary therapies including:  PT/OT  Restorative Nursing  Ice pack to site QID and prn  Diversional activities  Repositioning Q2 hours and prn   Patient is being discharged with the following home health services:  HHPT/OT through [facility]  Patient is being discharged with the following durable medical equipment:  none  Patient has been advised to f/u with their PCP in 1-2 weeks to bring them up to date on their rehab stay.  Social services at facility was responsible for arranging this appointment.  Pt was provided with a 30 day supply of prescriptions for medications and refills must be obtained from their PCP.  For controlled substances, a more limited supply may be provided adequate until PCP appointment only.  Future labs/tests needed:    Family/ staff Communication:   Total Time:  Documentation:  Face to Face:  Family/Phone:  Vikki Ports, NP-C [facility] [address], [place] [address] Cell Phone (Mon-Fri 8am-5pm):  [phone] On Call:  [phone] & follow prompts after 5pm & weekends Office Phone:  [phone] Office Fax:  [phone]

## 2016-02-25 NOTE — Addendum Note (Signed)
Addended by: Toni Arthurs H on: 02/25/2016 09:50 PM   Modules accepted: Level of Service

## 2016-03-07 ENCOUNTER — Encounter: Admission: RE | Payer: Self-pay | Source: Ambulatory Visit

## 2016-03-07 ENCOUNTER — Ambulatory Visit: Admission: RE | Admit: 2016-03-07 | Payer: Medicare HMO | Source: Ambulatory Visit | Admitting: Gastroenterology

## 2016-03-07 SURGERY — SIGMOIDOSCOPY, FLEXIBLE
Anesthesia: General

## 2016-03-26 ENCOUNTER — Ambulatory Visit
Admission: RE | Admit: 2016-03-26 | Discharge: 2016-03-26 | Disposition: A | Discharge status: Home / Self Care | Payer: Medicare HMO | Source: Ambulatory Visit | Attending: Internal Medicine | Admitting: Internal Medicine

## 2016-03-26 ENCOUNTER — Other Ambulatory Visit: Payer: Self-pay | Admitting: Internal Medicine

## 2016-03-26 DIAGNOSIS — M79605 Pain in left leg: Secondary | ICD-10-CM | POA: Diagnosis present

## 2016-06-05 ENCOUNTER — Other Ambulatory Visit
Admission: RE | Admit: 2016-06-05 | Discharge: 2016-06-05 | Disposition: A | Discharge status: Home / Self Care | Payer: Medicare HMO | Source: Ambulatory Visit | Attending: Ophthalmology | Admitting: Ophthalmology

## 2016-06-05 DIAGNOSIS — H109 Unspecified conjunctivitis: Secondary | ICD-10-CM | POA: Insufficient documentation

## 2016-06-08 LAB — EYE CULTURE
Culture: NORMAL
SPECIAL REQUESTS: NORMAL

## 2016-08-30 ENCOUNTER — Encounter
Admission: EM | Disposition: A | Discharge status: Skilled Nursing Facility | Payer: Self-pay | Source: Home / Self Care | Attending: Internal Medicine

## 2016-08-30 ENCOUNTER — Inpatient Hospital Stay: Payer: Medicare HMO

## 2016-08-30 ENCOUNTER — Inpatient Hospital Stay: Payer: Medicare HMO | Admitting: Anesthesiology

## 2016-08-30 ENCOUNTER — Emergency Department: Payer: Medicare HMO

## 2016-08-30 ENCOUNTER — Inpatient Hospital Stay
Admission: EM | Admit: 2016-08-30 | Discharge: 2016-09-03 | DRG: 481 | Disposition: A | Discharge status: Skilled Nursing Facility | Payer: Medicare HMO | Attending: Internal Medicine | Admitting: Internal Medicine

## 2016-08-30 ENCOUNTER — Encounter: Payer: Self-pay | Admitting: Emergency Medicine

## 2016-08-30 DIAGNOSIS — K7460 Unspecified cirrhosis of liver: Secondary | ICD-10-CM | POA: Diagnosis present

## 2016-08-30 DIAGNOSIS — W06XXXA Fall from bed, initial encounter: Secondary | ICD-10-CM | POA: Diagnosis present

## 2016-08-30 DIAGNOSIS — Z95810 Presence of automatic (implantable) cardiac defibrillator: Secondary | ICD-10-CM | POA: Diagnosis not present

## 2016-08-30 DIAGNOSIS — W19XXXA Unspecified fall, initial encounter: Secondary | ICD-10-CM

## 2016-08-30 DIAGNOSIS — D649 Anemia, unspecified: Secondary | ICD-10-CM | POA: Diagnosis present

## 2016-08-30 DIAGNOSIS — G8918 Other acute postprocedural pain: Secondary | ICD-10-CM

## 2016-08-30 DIAGNOSIS — S72009A Fracture of unspecified part of neck of unspecified femur, initial encounter for closed fracture: Secondary | ICD-10-CM | POA: Diagnosis present

## 2016-08-30 DIAGNOSIS — R569 Unspecified convulsions: Secondary | ICD-10-CM | POA: Diagnosis present

## 2016-08-30 DIAGNOSIS — Z96653 Presence of artificial knee joint, bilateral: Secondary | ICD-10-CM | POA: Diagnosis present

## 2016-08-30 DIAGNOSIS — Z79899 Other long term (current) drug therapy: Secondary | ICD-10-CM | POA: Diagnosis not present

## 2016-08-30 DIAGNOSIS — Y838 Other surgical procedures as the cause of abnormal reaction of the patient, or of later complication, without mention of misadventure at the time of the procedure: Secondary | ICD-10-CM | POA: Diagnosis not present

## 2016-08-30 DIAGNOSIS — Z85828 Personal history of other malignant neoplasm of skin: Secondary | ICD-10-CM

## 2016-08-30 DIAGNOSIS — I5022 Chronic systolic (congestive) heart failure: Secondary | ICD-10-CM | POA: Diagnosis present

## 2016-08-30 DIAGNOSIS — Z96642 Presence of left artificial hip joint: Secondary | ICD-10-CM | POA: Diagnosis present

## 2016-08-30 DIAGNOSIS — Z8781 Personal history of (healed) traumatic fracture: Secondary | ICD-10-CM

## 2016-08-30 DIAGNOSIS — I9789 Other postprocedural complications and disorders of the circulatory system, not elsewhere classified: Secondary | ICD-10-CM | POA: Diagnosis not present

## 2016-08-30 DIAGNOSIS — M9712XA Periprosthetic fracture around internal prosthetic left knee joint, initial encounter: Secondary | ICD-10-CM | POA: Diagnosis present

## 2016-08-30 DIAGNOSIS — Z66 Do not resuscitate: Secondary | ICD-10-CM | POA: Diagnosis present

## 2016-08-30 DIAGNOSIS — G912 (Idiopathic) normal pressure hydrocephalus: Secondary | ICD-10-CM | POA: Diagnosis present

## 2016-08-30 DIAGNOSIS — I110 Hypertensive heart disease with heart failure: Secondary | ICD-10-CM | POA: Diagnosis present

## 2016-08-30 DIAGNOSIS — S72402A Unspecified fracture of lower end of left femur, initial encounter for closed fracture: Secondary | ICD-10-CM | POA: Diagnosis present

## 2016-08-30 DIAGNOSIS — Z7982 Long term (current) use of aspirin: Secondary | ICD-10-CM

## 2016-08-30 DIAGNOSIS — G4730 Sleep apnea, unspecified: Secondary | ICD-10-CM | POA: Diagnosis present

## 2016-08-30 DIAGNOSIS — Z8249 Family history of ischemic heart disease and other diseases of the circulatory system: Secondary | ICD-10-CM

## 2016-08-30 DIAGNOSIS — S728X2A Other fracture of left femur, initial encounter for closed fracture: Secondary | ICD-10-CM

## 2016-08-30 DIAGNOSIS — S7292XA Unspecified fracture of left femur, initial encounter for closed fracture: Secondary | ICD-10-CM | POA: Diagnosis present

## 2016-08-30 DIAGNOSIS — F0390 Unspecified dementia without behavioral disturbance: Secondary | ICD-10-CM | POA: Diagnosis present

## 2016-08-30 DIAGNOSIS — Z9889 Other specified postprocedural states: Secondary | ICD-10-CM

## 2016-08-30 HISTORY — PX: ORIF FEMUR FRACTURE: SHX2119

## 2016-08-30 LAB — CBC WITH DIFFERENTIAL/PLATELET
BASOS PCT: 1 %
Basophils Absolute: 0.1 10*3/uL (ref 0–0.1)
EOS PCT: 5 %
Eosinophils Absolute: 0.4 10*3/uL (ref 0–0.7)
HEMATOCRIT: 39 % — AB (ref 40.0–52.0)
Hemoglobin: 13.6 g/dL (ref 13.0–18.0)
LYMPHS ABS: 1.5 10*3/uL (ref 1.0–3.6)
LYMPHS PCT: 19 %
MCH: 32.9 pg (ref 26.0–34.0)
MCHC: 34.9 g/dL (ref 32.0–36.0)
MCV: 94.1 fL (ref 80.0–100.0)
MONO ABS: 0.7 10*3/uL (ref 0.2–1.0)
Monocytes Relative: 9 %
NEUTROS ABS: 5.2 10*3/uL (ref 1.4–6.5)
Neutrophils Relative %: 66 %
Platelets: 265 10*3/uL (ref 150–440)
RBC: 4.15 MIL/uL — ABNORMAL LOW (ref 4.40–5.90)
RDW: 15.4 % — AB (ref 11.5–14.5)
WBC: 7.8 10*3/uL (ref 3.8–10.6)

## 2016-08-30 LAB — BASIC METABOLIC PANEL
ANION GAP: 6 (ref 5–15)
Anion gap: 5 (ref 5–15)
BUN: 15 mg/dL (ref 6–20)
BUN: 16 mg/dL (ref 6–20)
CALCIUM: 8.9 mg/dL (ref 8.9–10.3)
CHLORIDE: 109 mmol/L (ref 101–111)
CHLORIDE: 108 mmol/L (ref 101–111)
CO2: 28 mmol/L (ref 22–32)
CO2: 31 mmol/L (ref 22–32)
CREATININE: 0.79 mg/dL (ref 0.61–1.24)
CREATININE: 0.87 mg/dL (ref 0.61–1.24)
Calcium: 9.1 mg/dL (ref 8.9–10.3)
GFR calc Af Amer: >60 mL/min (ref >60)
GFR calc non Af Amer: >60 mL/min (ref >60)
GFR calc non Af Amer: >60 mL/min (ref >60)
GLUCOSE: 106 mg/dL — AB (ref 65–99)
GLUCOSE: 132 mg/dL — AB (ref 65–99)
Potassium: 3.9 mmol/L (ref 3.5–5.1)
Potassium: 3.9 mmol/L (ref 3.5–5.1)
Sodium: 142 mmol/L (ref 135–145)
Sodium: 145 mmol/L (ref 135–145)

## 2016-08-30 LAB — CBC
HCT: 37.2 % — ABNORMAL LOW (ref 40.0–52.0)
HEMOGLOBIN: 12.9 g/dL — AB (ref 13.0–18.0)
MCH: 33.6 pg (ref 26.0–34.0)
MCHC: 34.8 g/dL (ref 32.0–36.0)
MCV: 96.5 fL (ref 80.0–100.0)
Platelets: 253 10*3/uL (ref 150–440)
RBC: 3.85 MIL/uL — AB (ref 4.40–5.90)
RDW: 15.4 % — ABNORMAL HIGH (ref 11.5–14.5)
WBC: 10.2 10*3/uL (ref 3.8–10.6)

## 2016-08-30 LAB — SURGICAL PCR SCREEN
MRSA, PCR: NEGATIVE
Staphylococcus aureus: NEGATIVE

## 2016-08-30 SURGERY — OPEN REDUCTION INTERNAL FIXATION (ORIF) DISTAL FEMUR FRACTURE
Anesthesia: General | Laterality: Left

## 2016-08-30 MED ORDER — DEXAMETHASONE SODIUM PHOSPHATE 10 MG/ML IJ SOLN
INTRAMUSCULAR | Status: AC
  Filled 2016-08-30: qty 1

## 2016-08-30 MED ORDER — FOLIC ACID 0.5 MG HALF TAB
500.0000 ug | ORAL_TABLET | Freq: Every day | ORAL | Status: DC
  Administered 2016-08-31 – 2016-09-02 (×3): 0.5 mg via ORAL
  Filled 2016-08-30 (×5): qty 1

## 2016-08-30 MED ORDER — ONDANSETRON HCL 4 MG/2ML IJ SOLN: 4.0000 mg | Freq: Four times a day (QID) | INTRAMUSCULAR | Status: DC | PRN

## 2016-08-30 MED ORDER — LISINOPRIL 5 MG PO TABS
2.5000 mg | ORAL_TABLET | Freq: Every day | ORAL | Status: DC
  Administered 2016-08-30 – 2016-08-31 (×2): 2.5 mg via ORAL
  Filled 2016-08-30 (×3): qty 1

## 2016-08-30 MED ORDER — FENTANYL CITRATE (PF) 100 MCG/2ML IJ SOLN
INTRAMUSCULAR | Status: AC
  Administered 2016-08-30: 25 ug via INTRAVENOUS
  Filled 2016-08-30: qty 2

## 2016-08-30 MED ORDER — SEVOFLURANE IN SOLN
RESPIRATORY_TRACT | Status: AC
  Filled 2016-08-30: qty 250

## 2016-08-30 MED ORDER — DONEPEZIL HCL 5 MG PO TABS
5.0000 mg | ORAL_TABLET | Freq: Every day | ORAL | Status: DC
  Filled 2016-08-30 (×3): qty 1

## 2016-08-30 MED ORDER — ACETAMINOPHEN 500 MG PO TABS
1000.0000 mg | ORAL_TABLET | Freq: Four times a day (QID) | ORAL | Status: AC
  Administered 2016-08-30 – 2016-08-31 (×4): 1000 mg via ORAL
  Filled 2016-08-30 (×4): qty 2

## 2016-08-30 MED ORDER — PROPOFOL 10 MG/ML IV BOLUS
INTRAVENOUS | Status: DC | PRN
  Administered 2016-08-30: 80 mg via INTRAVENOUS

## 2016-08-30 MED ORDER — ONDANSETRON HCL 4 MG/2ML IJ SOLN: 4.0000 mg | Freq: Once | INTRAMUSCULAR | Status: DC | PRN

## 2016-08-30 MED ORDER — PROPOFOL 10 MG/ML IV BOLUS
INTRAVENOUS | Status: AC
  Filled 2016-08-30: qty 20

## 2016-08-30 MED ORDER — ACETAMINOPHEN 325 MG PO TABS
650.0000 mg | ORAL_TABLET | Freq: Four times a day (QID) | ORAL | Status: DC | PRN
  Administered 2016-08-30: 650 mg via ORAL
  Filled 2016-08-30: qty 2

## 2016-08-30 MED ORDER — FENTANYL CITRATE (PF) 100 MCG/2ML IJ SOLN
INTRAMUSCULAR | Status: DC | PRN
  Administered 2016-08-30: 25 ug via INTRAVENOUS

## 2016-08-30 MED ORDER — ACETAMINOPHEN 500 MG PO TABS
1000.0000 mg | ORAL_TABLET | Freq: Once | ORAL | Status: AC
  Administered 2016-08-30: 1000 mg via ORAL
  Filled 2016-08-30: qty 2

## 2016-08-30 MED ORDER — EPHEDRINE SULFATE 50 MG/ML IJ SOLN
INTRAMUSCULAR | Status: DC | PRN
  Administered 2016-08-30: 10 mg via INTRAVENOUS

## 2016-08-30 MED ORDER — SUCCINYLCHOLINE CHLORIDE 20 MG/ML IJ SOLN
INTRAMUSCULAR | Status: DC | PRN
  Administered 2016-08-30: 100 mg via INTRAVENOUS

## 2016-08-30 MED ORDER — FENTANYL CITRATE (PF) 100 MCG/2ML IJ SOLN
INTRAMUSCULAR | Status: AC
  Filled 2016-08-30: qty 2

## 2016-08-30 MED ORDER — SODIUM CHLORIDE 0.9 % IV SOLN
INTRAVENOUS | Status: DC | PRN
  Administered 2016-08-30: 13:00:00 via INTRAVENOUS

## 2016-08-30 MED ORDER — SODIUM CHLORIDE 0.9 % IV SOLN
INTRAVENOUS | Status: DC
  Administered 2016-08-30: 18:00:00 via INTRAVENOUS

## 2016-08-30 MED ORDER — ROCURONIUM BROMIDE 100 MG/10ML IV SOLN
INTRAVENOUS | Status: DC | PRN
  Administered 2016-08-30: 20 mg via INTRAVENOUS

## 2016-08-30 MED ORDER — KETOROLAC TROMETHAMINE 15 MG/ML IJ SOLN
15.0000 mg | Freq: Four times a day (QID) | INTRAMUSCULAR | Status: DC | PRN
  Filled 2016-08-30: qty 1

## 2016-08-30 MED ORDER — ACETAMINOPHEN 650 MG RE SUPP: 650.0000 mg | Freq: Four times a day (QID) | RECTAL | Status: DC | PRN

## 2016-08-30 MED ORDER — KETOROLAC TROMETHAMINE 15 MG/ML IJ SOLN
7.5000 mg | Freq: Four times a day (QID) | INTRAMUSCULAR | Status: AC | PRN
  Administered 2016-09-01: 7.5 mg via INTRAVENOUS
  Filled 2016-08-30: qty 1

## 2016-08-30 MED ORDER — ONDANSETRON HCL 4 MG/2ML IJ SOLN
INTRAMUSCULAR | Status: DC | PRN
  Administered 2016-08-30: 4 mg via INTRAVENOUS

## 2016-08-30 MED ORDER — VANCOMYCIN HCL IN DEXTROSE 750-5 MG/150ML-% IV SOLN
750.0000 mg | Freq: Two times a day (BID) | INTRAVENOUS | Status: AC
  Administered 2016-08-31: 750 mg via INTRAVENOUS
  Filled 2016-08-30: qty 150

## 2016-08-30 MED ORDER — PHENYLEPHRINE HCL 10 MG/ML IJ SOLN
INTRAMUSCULAR | Status: DC | PRN
  Administered 2016-08-30 (×7): 100 ug via INTRAVENOUS

## 2016-08-30 MED ORDER — SODIUM CHLORIDE 0.9 % IV SOLN
500.0000 mg | Freq: Once | INTRAVENOUS | Status: AC
  Administered 2016-08-30: 500 mg via INTRAVENOUS
  Filled 2016-08-30: qty 500

## 2016-08-30 MED ORDER — ENOXAPARIN SODIUM 30 MG/0.3ML ~~LOC~~ SOLN
30.0000 mg | SUBCUTANEOUS | Status: DC
  Administered 2016-08-31 – 2016-09-03 (×4): 30 mg via SUBCUTANEOUS
  Filled 2016-08-30 (×4): qty 0.3

## 2016-08-30 MED ORDER — DEXTROSE-NACL 5-0.9 % IV SOLN
INTRAVENOUS | Status: DC
  Administered 2016-08-30: 04:00:00 via INTRAVENOUS

## 2016-08-30 MED ORDER — FENTANYL CITRATE (PF) 100 MCG/2ML IJ SOLN
25.0000 ug | INTRAMUSCULAR | Status: DC | PRN
  Administered 2016-08-30 (×4): 25 ug via INTRAVENOUS

## 2016-08-30 MED ORDER — FENTANYL CITRATE (PF) 100 MCG/2ML IJ SOLN: INTRAMUSCULAR | Status: DC | PRN

## 2016-08-30 MED ORDER — LIDOCAINE HCL (CARDIAC) 20 MG/ML IV SOLN
INTRAVENOUS | Status: DC | PRN
  Administered 2016-08-30: 80 mg via INTRAVENOUS

## 2016-08-30 MED ORDER — LAMOTRIGINE 25 MG PO TABS
50.0000 mg | ORAL_TABLET | Freq: Every day | ORAL | Status: DC
  Filled 2016-08-30: qty 2

## 2016-08-30 MED ORDER — ONDANSETRON HCL 4 MG PO TABS: 4.0000 mg | ORAL_TABLET | Freq: Four times a day (QID) | ORAL | Status: DC | PRN

## 2016-08-30 MED ORDER — METOCLOPRAMIDE HCL 10 MG PO TABS: 5.0000 mg | ORAL_TABLET | Freq: Three times a day (TID) | ORAL | Status: DC | PRN

## 2016-08-30 MED ORDER — NEOMYCIN-POLYMYXIN B GU 40-200000 IR SOLN
Status: DC | PRN
  Administered 2016-08-30: 2 mL

## 2016-08-30 MED ORDER — DEXAMETHASONE SODIUM PHOSPHATE 10 MG/ML IJ SOLN
INTRAMUSCULAR | Status: DC | PRN
  Administered 2016-08-30: 10 mg via INTRAVENOUS

## 2016-08-30 MED ORDER — HYDROCODONE-ACETAMINOPHEN 7.5-325 MG PO TABS: 1.0000 | ORAL_TABLET | Freq: Four times a day (QID) | ORAL | Status: DC | PRN

## 2016-08-30 MED ORDER — ROCURONIUM BROMIDE 50 MG/5ML IV SOLN
INTRAVENOUS | Status: AC
  Filled 2016-08-30: qty 1

## 2016-08-30 MED ORDER — LIDOCAINE HCL (PF) 2 % IJ SOLN
INTRAMUSCULAR | Status: AC
Start: 2016-08-30 — End: 2016-08-30
  Filled 2016-08-30: qty 2

## 2016-08-30 MED ORDER — ACETAMINOPHEN 325 MG PO TABS
650.0000 mg | ORAL_TABLET | Freq: Four times a day (QID) | ORAL | Status: DC | PRN
  Administered 2016-08-31 – 2016-09-02 (×3): 650 mg via ORAL
  Filled 2016-08-30 (×3): qty 2

## 2016-08-30 MED ORDER — MELATONIN 5 MG PO TABS
10.0000 mg | ORAL_TABLET | Freq: Every day | ORAL | Status: DC
  Administered 2016-08-31 – 2016-09-02 (×3): 10 mg via ORAL
  Filled 2016-08-30 (×5): qty 2

## 2016-08-30 MED ORDER — NEOMYCIN-POLYMYXIN B GU 40-200000 IR SOLN
Status: AC
Start: 2016-08-30 — End: 2016-08-30
  Filled 2016-08-30: qty 2

## 2016-08-30 MED ORDER — SENNOSIDES-DOCUSATE SODIUM 8.6-50 MG PO TABS: 1.0000 | ORAL_TABLET | Freq: Every evening | ORAL | Status: DC | PRN

## 2016-08-30 MED ORDER — METOCLOPRAMIDE HCL 5 MG/ML IJ SOLN
5.0000 mg | Freq: Three times a day (TID) | INTRAMUSCULAR | Status: DC | PRN
Start: 2016-08-30 — End: 2016-09-03

## 2016-08-30 MED ORDER — DUTASTERIDE 0.5 MG PO CAPS
0.5000 mg | ORAL_CAPSULE | Freq: Every day | ORAL | Status: DC
  Administered 2016-08-30 – 2016-09-02 (×4): 0.5 mg via ORAL
  Filled 2016-08-30 (×5): qty 1

## 2016-08-30 MED ORDER — MAGNESIUM OXIDE 400 (241.3 MG) MG PO TABS
400.0000 mg | ORAL_TABLET | Freq: Every evening | ORAL | Status: DC
  Administered 2016-08-31 – 2016-09-02 (×3): 400 mg via ORAL
  Filled 2016-08-30 (×3): qty 1

## 2016-08-30 MED ORDER — POLYMYXIN B-TRIMETHOPRIM 10000-0.1 UNIT/ML-% OP SOLN
1.0000 [drp] | OPHTHALMIC | Status: DC
  Administered 2016-08-30 – 2016-08-31 (×6): 1 [drp] via OPHTHALMIC
  Filled 2016-08-30: qty 10

## 2016-08-30 MED ORDER — SENNOSIDES-DOCUSATE SODIUM 8.6-50 MG PO TABS
1.0000 | ORAL_TABLET | Freq: Every evening | ORAL | Status: DC | PRN
  Filled 2016-08-30: qty 1

## 2016-08-30 MED ORDER — ONDANSETRON HCL 4 MG/2ML IJ SOLN
INTRAMUSCULAR | Status: AC
  Filled 2016-08-30: qty 2

## 2016-08-30 MED ORDER — VITAMIN D 1000 UNITS PO TABS
2000.0000 [IU] | ORAL_TABLET | Freq: Every evening | ORAL | Status: DC
  Administered 2016-08-31 – 2016-09-02 (×3): 2000 [IU] via ORAL
  Filled 2016-08-30 (×3): qty 2

## 2016-08-30 MED ORDER — HYDROCODONE-ACETAMINOPHEN 5-325 MG PO TABS: 1.0000 | ORAL_TABLET | ORAL | Status: DC | PRN

## 2016-08-30 SURGICAL SUPPLY — 48 items
BIT DRILL 3.8 CALIBRATED (BIT) ×2
BIT DRILL 3.8MM CALIBRATED (BIT) ×2 IMPLANT
CANISTER SUCT 1200ML W/VALVE (MISCELLANEOUS) ×3 IMPLANT
CHLORAPREP W/TINT 26ML (MISCELLANEOUS) ×3 IMPLANT
DRAPE C-ARM XRAY 36X54 (DRAPES) ×3 IMPLANT
DRAPE C-ARMOR (DRAPES) ×3 IMPLANT
DRILL BIT 3.8MM CALIBRATED (BIT) ×4
DRSG OPSITE POSTOP 4X14 (GAUZE/BANDAGES/DRESSINGS) ×3 IMPLANT
ELECT REM PT RETURN 9FT ADLT (ELECTROSURGICAL) ×3
ELECTRODE REM PT RTRN 9FT ADLT (ELECTROSURGICAL) ×1 IMPLANT
GAUZE PETRO XEROFOAM 1X8 (MISCELLANEOUS) ×3 IMPLANT
GAUZE SPONGE 4X4 12PLY STRL (GAUZE/BANDAGES/DRESSINGS) IMPLANT
GLOVE BIO SURGEON STRL SZ7.5 (GLOVE) ×9 IMPLANT
GLOVE BIOGEL PI IND STRL 9 (GLOVE) ×1 IMPLANT
GLOVE BIOGEL PI INDICATOR 9 (GLOVE) ×2
GLOVE SURG SYN 9.0  PF PI (GLOVE) ×2
GLOVE SURG SYN 9.0 PF PI (GLOVE) ×1 IMPLANT
GOWN SRG 2XL LVL 4 RGLN SLV (GOWNS) ×1 IMPLANT
GOWN STRL NON-REIN 2XL LVL4 (GOWNS) ×2
GOWN STRL REUS W/ TWL LRG LVL3 (GOWN DISPOSABLE) ×1 IMPLANT
GOWN STRL REUS W/TWL LRG LVL3 (GOWN DISPOSABLE) ×2
GUIDEPIN 3.2  ENDO CALB STRL (PIN) ×4
GUIDEPIN 3.2 ENDO CALB STRL (PIN) ×2 IMPLANT
HEMOVAC 400ML (MISCELLANEOUS)
K-WIRE ACE 1.6X6 (WIRE) ×6
KIT DRAIN HEMOVAC JP 7FR 400ML (MISCELLANEOUS) IMPLANT
KIT RM TURNOVER STRD PROC AR (KITS) ×3 IMPLANT
KWIRE ACE 1.6X6 (WIRE) ×2 IMPLANT
MAT BLUE FLOOR 46X72 FLO (MISCELLANEOUS) ×3 IMPLANT
NEEDLE FILTER BLUNT 18X 1/2SAF (NEEDLE) ×2
NEEDLE FILTER BLUNT 18X1 1/2 (NEEDLE) ×1 IMPLANT
NS IRRIG 500ML POUR BTL (IV SOLUTION) ×3 IMPLANT
PACK TOTAL KNEE (MISCELLANEOUS) ×3 IMPLANT
PLATE FEM 12H LT (Plate) ×3 IMPLANT
SCREW LOCK CANN FT 4.5X38 (Screw) ×3 IMPLANT
SCREW LOCK CORT 4.5X34 (Screw) ×6 IMPLANT
SCREW LOCK DIST FEM 5.5X25 (Screw) ×3 IMPLANT
SCREW LOCK DIST FEM 5.5X80 (Screw) ×3 IMPLANT
SCREW LOCK PLY TIB 5.5X75 (Screw) ×3 IMPLANT
SCREW LOCKING 4.5X08 (Screw) ×6 IMPLANT
SCREW NLOCK DIST FEM 5.5X60 (Screw) ×3 IMPLANT
SCREW NLOCK DIST FEM 5.5X90 (Screw) ×3 IMPLANT
STAPLER SKIN PROX 35W (STAPLE) ×3 IMPLANT
SUT VIC AB 0 CT1 36 (SUTURE) ×6 IMPLANT
SUT VIC AB 2-0 CT1 27 (SUTURE) ×4
SUT VIC AB 2-0 CT1 TAPERPNT 27 (SUTURE) ×2 IMPLANT
SYR 5ML LL (SYRINGE) ×3 IMPLANT
TAPE MICROFOAM 4IN (TAPE) IMPLANT

## 2016-08-30 NOTE — Transfer of Care (Signed)
Immediate Anesthesia Transfer of Care Note  Patient: Stephen Roth.  Procedure(s) Performed: Procedure(s): OPEN REDUCTION INTERNAL FIXATION (ORIF) DISTAL FEMUR FRACTURE (Left)  Patient Location: PACU  Anesthesia Type:General  Level of Consciousness: sedated  Airway & Oxygen Therapy: Patient Spontanous Breathing and Patient connected to face mask oxygen  Post-op Assessment: Report given to RN and Post -op Vital signs reviewed and stable  Post vital signs: Reviewed and stable  Last Vitals:  Vitals:   08/30/16 0348 08/30/16 1524  BP: 132/73 (!) 99/56  Pulse: 61 65  Resp: 18 10  Temp: 36.8 C 36.8 C  SpO2: 446% 950%    Complications: No apparent anesthesia complications

## 2016-08-30 NOTE — Op Note (Signed)
08/30/2016  3:19 PM  PATIENT:  Stephen Roth.  83 y.o. male  PRE-OPERATIVE DIAGNOSIS:  periprosthetic left femur fracture  POST-OPERATIVE DIAGNOSIS:  same  PROCEDURE:  Procedure(s): OPEN REDUCTION INTERNAL FIXATION (ORIF) DISTAL FEMUR FRACTURE (Left)  SURGEON: Laurene Footman, MD  ASSISTANTS: none  ANESTHESIA:   general  EBL:  Total I/O In: 700 [I.V.:700] Out: 100 [Urine:100]  BLOOD ADMINISTERED:none  DRAINS: none   LOCAL MEDICATIONS USED:  NONE  SPECIMEN:  No Specimen  DISPOSITION OF SPECIMEN:  N/A  COUNTS:  YES  TOURNIQUET:  none   IMPLANTS: biomet locking distal femur periprosthetic plate and screws  DICTATION: .Dragon Dictation patient brought the operating room and after adequate anesthesia was obtained the left leg was prepped and draped in sterile fashion after first checking length of the plate and opening that at the start of the case. After prepping and draping in the usual sterile fashion, appropriate patient identification and timeout procedures were completed anterior lateral portions made to the lateral femoral condyle and the lateral femur exposed. The plate was placed on a triangle and a bump underneath the fracture site to get essentially anatomic alignment. The plate was then slid submuscularly along the distal lateral femur along the shaft and checked on AP and lateral projections to make sure anesthesia acceptable position of the plate was obtained. This was then held provisionally with pins proximally and distally. The distal holes were then filled using locking screws with one nonlocking her to get the plate down to the bone. After this was performed the proximal screws were inserted with 3 cortical screws getting 2 cortices. At the level of the plate attempted made to get a screw through the plate into the periprosthetic area but one screw went anterior and the second screw was locked into the plate and is felt is better to leave this alone and try to  get an additional screw in. There is acceptable alignment clinically and on x-ray in both AP and lateral projections. The wound was then irrigated and closed with a running #1 Vicryl for the IT band 2-0 Vicryl subcutaneous and skin staples which is skin staples for the percutaneous incisions proximally. Xeroform and honeycomb dressing ABDs over the distal portion incision and Ace wrap around the knee were then applied. C-arm was used to throughout the procedure to assess alignment and hardware  PLAN OF CARE: Continue as inpatient  PATIENT DISPOSITION:  PACU - hemodynamically stable.

## 2016-08-30 NOTE — H&P (Addendum)
[facility] NAME: Stephen Roth    MR#:  [id number]  DATE OF BIRTH:  08/14/1925  DATE OF ADMISSION:  08/30/2016  PRIMARY CARE PHYSICIAN: Adin Hector, MD   REQUESTING/REFERRING PHYSICIAN:   CHIEF COMPLAINT:   Chief Complaint  Patient presents with  . Fall    HISTORY OF PRESENT ILLNESS: Stephen Roth  is a 83 y.o. male with a known history of basal cell cancer of the eyelid, congestive heart failure, cirrhosis of liver, hypertension, sleep apnea fell down in the facility. Patient lost balance and fell on the left side of the hip and lower extremity. He has pain in the left thigh. He was evaluated in the emergency room with x-rays of the hip and left lower extremity which showed distal femur fracture on the left side. The pain is aching in nature 7 out of 10 on a scale of 1-10. No complaints of any chest pain, shortness of breath. No history of head injury. No loss of consciousness. Hospitalist service was consulted for further care.  PAST MEDICAL HISTORY:   Past Medical History:  Diagnosis Date  . Cancer ([place])    basal cell on eyelid  . CHF (congestive heart failure) ([place])   . Cirrhosis ([place])   . Hypertension   . Normal pressure hydrocephalus   . Sleep apnea     PAST SURGICAL HISTORY: Past Surgical History:  Procedure Laterality Date  . HERNIA REPAIR    . HIP ARTHROPLASTY Left 01/20/2016   Procedure: ARTHROPLASTY BIPOLAR HIP (HEMIARTHROPLASTY);  Surgeon: Corky Mull, MD;  Location: [facility] ORS;  Service: Orthopedics;  Laterality: Left;  . IMPLANTABLE CARDIOVERTER DEFIBRILLATOR (ICD) GENERATOR CHANGE Left 07/13/2015   Procedure: ICD GENERATOR CHANGE;  Surgeon: Marzetta Board, MD;  Location: [facility] ORS;  Service: Cardiovascular;  Laterality: Left;  . JOINT REPLACEMENT Bilateral    knees  . PACEMAKER INSERTION  2006   Pace maker, defibulator    SOCIAL HISTORY:  Social History  Substance Use Topics  . Smoking  status: Never Smoker  . Smokeless tobacco: Never Used  . Alcohol use No    FAMILY HISTORY:  Family History  Problem Relation Age of Onset  . Heart disease Father   . Bipolar disorder Brother     DRUG ALLERGIES:  Allergies  Allergen Reactions  . Cephalexin Other (See Comments)    Confusion, hives, hallucinatons  . Clindamycin/Lincomycin Other (See Comments)    Confusion, hives, hallucinations  . Morphine And Related Other (See Comments)    REVIEW OF SYSTEMS:   CONSTITUTIONAL: No fever, fatigue or weakness.  EYES: No blurred or double vision.  EARS, NOSE, AND THROAT: No tinnitus or ear pain.  RESPIRATORY: No cough, shortness of breath, wheezing or hemoptysis.  CARDIOVASCULAR: No chest pain, orthopnea, edema.  GASTROINTESTINAL: No nausea, vomiting, diarrhea or abdominal pain.  GENITOURINARY: No dysuria, hematuria.  ENDOCRINE: No polyuria, nocturia,  HEMATOLOGY: No anemia, easy bruising or bleeding SKIN: No rash or lesion. MUSCULOSKELETAL: Left hip and thigh pain NEUROLOGIC: No tingling, numbness, weakness.  PSYCHIATRY: No anxiety or depression.   MEDICATIONS AT HOME:  Prior to Admission medications   Medication Sig Start Date End Date Taking? Authorizing Provider  aspirin EC 81 MG tablet Take 81 mg by mouth daily.    [provider]  bisacodyl (DULCOLAX) 10 MG suppository Place 1 suppository (10 mg total) rectally daily as needed for moderate constipation. 01/23/16   Vaughan Basta, MD  Cholecalciferol (  VITAMIN D-3 PO) Take 2,000 Units by mouth every evening.     [provider]  Docusate Calcium (STOOL SOFTENER PO) Take 1 tablet by mouth 2 (two) times daily.    [provider]  donepezil (ARICEPT) 5 MG tablet Take 5 mg by mouth at bedtime.    [provider]  dutasteride (AVODART) 0.5 MG capsule Take 0.5 mg by mouth daily.    [provider]  enoxaparin (LOVENOX) 40 MG/0.4ML injection Inject 0.4 mLs (40 mg total) into  the skin daily. 01/24/16   Vaughan Basta, MD  folic acid (FOLVITE) 315 MCG tablet Take 400 mcg by mouth daily.    [provider]  HYDROcodone-acetaminophen (NORCO) 7.5-325 MG tablet Take 1 tablet by mouth every 6 (six) hours as needed for moderate pain. 01/23/16   Vaughan Basta, MD  lamoTRIgine (LAMICTAL) 25 MG tablet Take 25 mg by mouth at bedtime.     [provider]  lamoTRIgine (LAMICTAL) 25 MG tablet Take 2 tablets (50 mg total) by mouth at bedtime. 01/23/16   Vaughan Basta, MD  lisinopril (PRINIVIL,ZESTRIL) 2.5 MG tablet Take 2.5 mg by mouth daily.    [provider]  Magnesium 500 MG CAPS Take 500 mg by mouth every evening.     [provider]  Melatonin 10 MG TABS Take 10 mg by mouth at bedtime.    [provider]  Misc Natural Products (LUTEIN 20 PO) Take 1 capsule by mouth every evening.    [provider]  Multiple Vitamins-Minerals (CENTRUM SILVER PO) Take 1 tablet by mouth daily.    [provider]  senna-docusate (SENOKOT-S) 8.6-50 MG tablet Take 1 tablet by mouth at bedtime as needed for mild constipation. 01/23/16   Vaughan Basta, MD  Specialty Vitamins Products (BILBERRY PLUS PO) Take 1 tablet by mouth daily.    [provider]  trimethoprim-polymyxin b (POLYTRIM) ophthalmic solution Place 1 drop into the left eye every 4 (four) hours. 01/23/16   Vaughan Basta, MD      PHYSICAL EXAMINATION:   VITAL SIGNS: Blood pressure 119/68, pulse (!) 54, temperature 98.3 F (36.8 C), temperature source Oral, resp. rate 11, height 5\' 10"  (1.778 m), weight 71.7 kg (158 lb), SpO2 100 %.  GENERAL:  83 y.o.-year-old patient lying in the bed with no acute distress.  EYES: Left Pupil reactive to light and accommodation. No scleral icterus. Extraocular muscles intact.  HEENT: Head atraumatic, normocephalic. Oropharynx and nasopharynx clear.  NECK:  Supple, no jugular venous distention.  No thyroid enlargement, no tenderness.  LUNGS: Normal breath sounds bilaterally, no wheezing, rales,rhonchi or crepitation. No use of accessory muscles of respiration.  CARDIOVASCULAR: S1, S2 normal. No murmurs, rubs, or gallops.  ABDOMEN: Soft, nontender, nondistended. Bowel sounds present. No organomegaly or mass.  EXTREMITIES: No pedal edema, cyanosis, or clubbing. Tenderness left hip and left thigh close to knee  NEUROLOGIC: Cranial nerves II through XII are intact. Muscle strength 5/5 in all extremities. Sensation intact. Gait not checked.  PSYCHIATRIC: The patient is alert and oriented x 3.  SKIN: No obvious rash, lesion, or ulcer.   LABORATORY PANEL:   CBC  Recent Labs Lab 08/30/16 0041  WBC 7.8  HGB 13.6  HCT 39.0*  PLT 265  MCV 94.1  MCH 32.9  MCHC 34.9  RDW 15.4*  LYMPHSABS 1.5  MONOABS 0.7  EOSABS 0.4  BASOSABS 0.1   ------------------------------------------------------------------------------------------------------------------  Chemistries   Recent Labs Lab 08/30/16 0041  NA 142  K 3.9  CL 109  CO2 28  GLUCOSE 106*  BUN 15  CREATININE 0.79  CALCIUM 8.9   ------------------------------------------------------------------------------------------------------------------ estimated creatinine clearance is 64.7 mL/min (by C-G formula based on SCr of 0.79 mg/dL). ------------------------------------------------------------------------------------------------------------------ No results for input(s): TSH, T4TOTAL, T3FREE, THYROIDAB in the last 72 hours.  Invalid input(s): FREET3   Coagulation profile No results for input(s): INR, PROTIME in the last 168 hours. ------------------------------------------------------------------------------------------------------------------- No results for input(s): DDIMER in the last 72 hours. -------------------------------------------------------------------------------------------------------------------  Cardiac  Enzymes No results for input(s): CKMB, TROPONINI, MYOGLOBIN in the last 168 hours.  Invalid input(s): CK ------------------------------------------------------------------------------------------------------------------ Invalid input(s): POCBNP  ---------------------------------------------------------------------------------------------------------------  Urinalysis    Component Value Date/Time   COLORURINE YELLOW (A) 01/30/2016 0330   APPEARANCEUR CLEAR (A) 01/30/2016 0330   LABSPEC 1.010 01/30/2016 0330   PHURINE 7.0 01/30/2016 0330   GLUCOSEU NEGATIVE 01/30/2016 0330   HGBUR LARGE (A) 01/30/2016 0330   BILIRUBINUR NEGATIVE 01/30/2016 0330   KETONESUR NEGATIVE 01/30/2016 0330   PROTEINUR NEGATIVE 01/30/2016 0330   NITRITE NEGATIVE 01/30/2016 0330   LEUKOCYTESUR NEGATIVE 01/30/2016 0330     RADIOLOGY: Dg Femur Min 2 Views Left  Result Date: 08/30/2016 CLINICAL DATA:  [place] from bed.  History of LEFT hip arthroplasty. EXAM: LEFT FEMUR 2 VIEWS COMPARISON:  None. FINDINGS: Acute distal femur fracture and with impaction, fracture extends to the femoral component of total knee arthroplasty. Posterior angulation distal bony fragments. Status post LEFT hip hemiarthroplasty, femoral head intact. No dislocation. Heterotopic ossification lesser trochanter. Osteopenia without destructive bony lesions. Phleboliths project in LEFT pelvis. Moderate vascular calcifications. IMPRESSION: Acute displaced distal femur fracture, fracture extends to femoral component of total knee arthroplasty. No dislocation. Electronically Signed   By: Elon Alas M.D.   On: 08/30/2016 01:40    EKG: Orders placed or performed during the hospital encounter of 08/30/16  . EKG 12-Lead  . EKG 12-Lead    IMPRESSION AND PLAN: 83 year old male patient with history of congestive heart failure, cirrhosis of liver, sleep apnea, hypertension presented to the emergency room with fall and tenderness in the left  hip.  Admitting diagnosis 1. Left femur fracture 2. Accidental fall 3. Congestive heart failure 4. Cirrhosis of liver 5.Hypertension Treatment plan Admit patient to medical floor Orthopedic surgery consultation Pain management with IV Toradol and oral Percocet IV fluid hydration Keep patient nothing by mouth   All the records are reviewed and case discussed with ED provider. Management plans discussed with the patient, family and they are in agreement.  CODE STATUS : DNR Code Status History    Date Active Date Inactive Code Status Order ID Comments User Context   01/20/2016  7:15 AM 01/23/2016  8:03 PM Full Code [id number]  Saundra Shelling, MD Inpatient   08/10/2015  9:17 PM 08/11/2015  3:39 PM Full Code [id number]  Henreitta Leber, MD Inpatient    Advance Directive Documentation     Most Recent Value  Type of Advance Directive  Out of facility DNR (pink MOST or yellow form), Healthcare Power of Attorney  Pre-existing out of facility DNR order (yellow form or pink MOST form)  Yellow form placed in chart (order not valid for inpatient use)  "MOST" Form in Place?  -       TOTAL TIME TAKING CARE OF THIS PATIENT: 50 minutes.    Saundra Shelling M.D on 08/30/2016 at 2:51 AM  Between 7am to 6pm - Pager - [phone]  After 6pm go to [email or web address] - password Child psychotherapist Hospitalists  Office  [phone]  CC: Primary care physician; Adin Hector, MD

## 2016-08-30 NOTE — Anesthesia Preprocedure Evaluation (Addendum)
Anesthesia Evaluation  Patient identified by MRN, date of birth, ID band Patient awake    Reviewed: Allergy & Precautions, NPO status , Patient's Chart, lab work & pertinent test results, reviewed documented beta blocker date and time   History of Anesthesia Complications (+) Emergence Delirium and history of anesthetic complications  Airway Mallampati: III  TM Distance: >3 FB     Dental  (+) Chipped, Dental Advidsory Given   Pulmonary neg shortness of breath, sleep apnea , neg COPD, neg recent URI,           Cardiovascular hypertension, Pt. on medications (-) angina+CHF  (-) CAD, (-) Past MI, (-) Cardiac Stents and (-) CABG + dysrhythmias (so pacemaker placed) Ventricular Tachycardia + pacemaker + Cardiac Defibrillator (-) Valvular Problems/Murmurs     Neuro/Psych neg Seizures PSYCHIATRIC DISORDERS (Dementia) Normal pressure hydrocephalus    GI/Hepatic negative GI ROS, (+) Cirrhosis       ,   Endo/Other  negative endocrine ROS  Renal/GU negative Renal ROS  negative genitourinary   Musculoskeletal   Abdominal   Peds  Hematology negative hematology ROS (+)   Anesthesia Other Findings Past Medical History: No date: Cancer ([place])     Comment:  basal cell on eyelid No date: CHF (congestive heart failure) ([facility]) No date: Cirrhosis ([place]) No date: Hypertension No date: Normal pressure hydrocephalus No date: Sleep apnea   Reproductive/Obstetrics negative OB ROS                            Anesthesia Physical  Anesthesia Plan  ASA: III  Anesthesia Plan: General   Post-op Pain Management:    Induction: Intravenous  PONV Risk Score and Plan: 2 and Ondansetron, Dexamethasone and Treatment may vary due to age or medical condition  Airway Management Planned: Oral ETT  Additional Equipment:   Intra-op Plan:   Post-operative Plan: Extubation in OR  Informed Consent: I have reviewed  the patients History and Physical, chart, labs and discussed the procedure including the risks, benefits and alternatives for the proposed anesthesia with the patient or authorized representative who has indicated his/her understanding and acceptance.     Plan Discussed with: CRNA  Anesthesia Plan Comments:         Anesthesia Quick Evaluation

## 2016-08-30 NOTE — ED Triage Notes (Signed)
Pt arrives from [facility] with c/o fall. Pt fell while getting out of his bed this evening. Pt has noted rotation to left leg. Pt denies LOC or head trauma of any kind. Pt is alert and oriented at this time with NAD noted.

## 2016-08-30 NOTE — NC FL2 (Signed)
[place] LEVEL OF CARE SCREENING TOOL     IDENTIFICATION  Patient Name: Stephen Roth. Birthdate: [birth year] Sex: male Admission Date (Current Location): 08/30/2016  [place] and [place] Number:  Engineering geologist and Address:  [facility], [address] [place], [place] [address]      Provider Number: [id number]  Attending Physician Name and Address:  Saundra Shelling, MD  Relative Name and Phone Number:  [name] (daughter/HCPOA) [phone]    Current Level of Care: Hospital Recommended Level of Care: [place] Prior Approval Number:    Date Approved/Denied: 03/27/10 PASRR Number: [id number]  Discharge Plan: SNF    Current Diagnoses: Patient Active Problem List   Diagnosis Date Noted  . Femur fracture, left ([place]) 08/30/2016  . Hip fracture ([place]) 01/20/2016  . GI bleed 08/10/2015    Orientation RESPIRATION BLADDER Height & Weight     Self, Time, Situation, Place  Normal Continent Weight: 154 lb 6.4 oz (70 kg) Height:  5\' 10"  (177.8 cm)  BEHAVIORAL SYMPTOMS/MOOD NEUROLOGICAL BOWEL NUTRITION STATUS      Continent Diet (Heart healthy)  AMBULATORY STATUS COMMUNICATION OF NEEDS Skin   Extensive Assist Verbally Surgical wounds                       Personal Care Assistance Level of Assistance  Bathing, Feeding, Dressing Bathing Assistance: Limited assistance Feeding assistance: Independent Dressing Assistance: Limited assistance     Functional Limitations Info             SPECIAL CARE FACTORS FREQUENCY  PT (By licensed PT)     PT Frequency: Up to 5X per day, 5 days per week              Contractures Contractures Info: Not present    Additional Factors Info  Code Status, Allergies, Psychotropic Code Status Info: DNR Allergies Info: Cephalexin, Clindamycin/lincomycin, Morphine And Related Psychotropic Info: Lamictal         Current Medications (08/30/2016):  This is  the current hospital active medication list Current Facility-Administered Medications  Medication Dose Route Frequency Provider Last Rate Last Dose  . [MAR Hold] acetaminophen (TYLENOL) tablet 650 mg  650 mg Oral Q6H PRN Saundra Shelling, MD   650 mg at 08/30/16 0820   Or  . [MAR Hold] acetaminophen (TYLENOL) suppository 650 mg  650 mg Rectal Q6H PRN Saundra Shelling, MD      . Doug Sou Hold] cholecalciferol (VITAMIN D) tablet 2,000 Units  2,000 Units Oral QPM Pyreddy, Pavan, MD      . dextrose 5 %-0.9 % sodium chloride infusion   Intravenous Continuous Saundra Shelling, MD 75 mL/hr at 08/30/16 0356    . [MAR Hold] donepezil (ARICEPT) tablet 5 mg  5 mg Oral QHS Pyreddy, Reatha Harps, MD      . Doug Sou Hold] dutasteride (AVODART) capsule 0.5 mg  0.5 mg Oral Daily Pyreddy, Pavan, MD   0.5 mg at 08/30/16 1141  . [MAR Hold] folic acid (FOLVITE) tablet 0.5 mg  500 mcg Oral Daily Pyreddy, Pavan, MD      . Doug Sou Hold] HYDROcodone-acetaminophen (NORCO) 7.5-325 MG per tablet 1 tablet  1 tablet Oral Q6H PRN Pyreddy, Reatha Harps, MD      . Doug Sou Hold] lamoTRIgine (LAMICTAL) tablet 50 mg  50 mg Oral QHS Pyreddy, Reatha Harps, MD      . Doug Sou Hold] lisinopril (PRINIVIL,ZESTRIL) tablet 2.5 mg  2.5 mg Oral Daily Pyreddy, Reatha Harps, MD   2.5  mg at 08/30/16 1141  . [MAR Hold] magnesium oxide (MAG-OX) tablet 400 mg  400 mg Oral QPM Pyreddy, Reatha Harps, MD      . Doug Sou Hold] Melatonin TABS 10 mg  10 mg Oral QHS Pyreddy, Reatha Harps, MD      . Doug Sou Hold] ondansetron (ZOFRAN) tablet 4 mg  4 mg Oral Q6H PRN Saundra Shelling, MD       Or  . Doug Sou Hold] ondansetron (ZOFRAN) injection 4 mg  4 mg Intravenous Q6H PRN Pyreddy, Reatha Harps, MD      . Doug Sou Hold] senna-docusate (Senokot-S) tablet 1 tablet  1 tablet Oral QHS PRN Saundra Shelling, MD      . Doug Sou Hold] senna-docusate (Senokot-S) tablet 1 tablet  1 tablet Oral QHS PRN Saundra Shelling, MD      . Mikaela.Ping Hold] trimethoprim-polymyxin b (POLYTRIM) ophthalmic solution 1 drop  1 drop Left Eye Q4H Saundra Shelling, MD   1 drop at  08/30/16 1141     Discharge Medications: Please see discharge summary for a list of discharge medications.  Relevant Imaging Results:  Relevant Lab Results:   Additional Information SS#[id number]  Zettie Pho, LCSW

## 2016-08-30 NOTE — ED Notes (Signed)
Called [facility] in order to see if daughter Stephen Roth has been contacted. Per facility, daughter has been attempted x4 to be called with no response. Spouse is at [facility] and aware of situation. Per facility, please call spouse at [facility] with any future information. [phone].

## 2016-08-30 NOTE — Anesthesia Postprocedure Evaluation (Signed)
Anesthesia Post Note  Patient: Dayshon Roback.  Procedure(s) Performed: Procedure(s) (LRB): OPEN REDUCTION INTERNAL FIXATION (ORIF) DISTAL FEMUR FRACTURE (Left)  Patient location during evaluation: PACU Anesthesia Type: General Level of consciousness: awake and alert Pain management: pain level controlled Vital Signs Assessment: post-procedure vital signs reviewed and stable Respiratory status: spontaneous breathing, nonlabored ventilation, respiratory function stable and patient connected to nasal cannula oxygen Cardiovascular status: blood pressure returned to baseline and stable Postop Assessment: no signs of nausea or vomiting Anesthetic complications: no Comments: Patient with A-fib on 12-lead EKG in the PACU.  Unsure if this is new for him.  He does have a pacemaker/defibrillator that the daughter said was due to syncope and an episode of V-fib, so this could be new.  He is otherwise hemodynamically stable.  We informed the primary team as well as consulted cardiology.  He is otherwise ready for discharge from the PACU.     Last Vitals:  Vitals:   08/30/16 1600 08/30/16 1722  BP:  (!) 113/59  Pulse: 64 69  Resp: 20 18  Temp:  36.9 C  SpO2: 100% (!) 83%    Last Pain:  Vitals:   08/30/16 1722  TempSrc: Oral  PainSc: 0-No pain                 Martha Clan

## 2016-08-30 NOTE — OR Nursing (Signed)
Patient in an irregular pattern Dr.Karenz called will get an ekg\

## 2016-08-30 NOTE — ED Notes (Signed)
Pt placed at time of triage with bed alarm, yellow fall risk arm band, and yellow socks. Pt not impulsive and able to communicate needs. Pt states at this time that he does not want "anything too strong for pain". Pt did take tylenol for discomfort.

## 2016-08-30 NOTE — Anesthesia Post-op Follow-up Note (Signed)
Anesthesia QCDR form completed.        

## 2016-08-30 NOTE — OR Nursing (Signed)
Patient resting vss pain is less

## 2016-08-30 NOTE — ED Provider Notes (Signed)
[facility] Emergency Department Provider Note  ____________________________________________   I have reviewed the triage vital signs and the nursing notes.   HISTORY  Chief Complaint Fall   History limited by: Not Limited   HPI Stephen Roth. is a 83 y.o. male who presents to the emergency department today brought in by EMS after a fall. He patient states that he was getting out of bed. He fell and hurt his left leg. The pain was immediate. It was severe. He is not sure if he twisted his leg or just fell on it. He denies any other injuries.   Past Medical History:  Diagnosis Date  . Cancer ([place])    basal cell on eyelid  . CHF (congestive heart failure) ([place])   . Cirrhosis ([place])   . Hypertension   . Normal pressure hydrocephalus   . Sleep apnea     Patient Active Problem List   Diagnosis Date Noted  . Hip fracture ([place]) 01/20/2016  . GI bleed 08/10/2015    Past Surgical History:  Procedure Laterality Date  . HERNIA REPAIR    . HIP ARTHROPLASTY Left 01/20/2016   Procedure: ARTHROPLASTY BIPOLAR HIP (HEMIARTHROPLASTY);  Surgeon: Corky Mull, MD;  Location: [facility] ORS;  Service: Orthopedics;  Laterality: Left;  . IMPLANTABLE CARDIOVERTER DEFIBRILLATOR (ICD) GENERATOR CHANGE Left 07/13/2015   Procedure: ICD GENERATOR CHANGE;  Surgeon: Marzetta Board, MD;  Location: [facility] ORS;  Service: Cardiovascular;  Laterality: Left;  . JOINT REPLACEMENT Bilateral    knees  . PACEMAKER INSERTION  2006   Pace maker, defibulator    Prior to Admission medications   Medication Sig Start Date End Date Taking? Authorizing Provider  aspirin EC 81 MG tablet Take 81 mg by mouth daily.    [provider]  bisacodyl (DULCOLAX) 10 MG suppository Place 1 suppository (10 mg total) rectally daily as needed for moderate constipation. 01/23/16   Vaughan Basta, MD  Cholecalciferol (VITAMIN D-3 PO) Take 2,000 Units by mouth every evening.     [provider]  Docusate Calcium (STOOL SOFTENER PO) Take 1 tablet by mouth 2 (two) times daily.    [provider]  donepezil (ARICEPT) 5 MG tablet Take 5 mg by mouth at bedtime.    [provider]  dutasteride (AVODART) 0.5 MG capsule Take 0.5 mg by mouth daily.    [provider]  enoxaparin (LOVENOX) 40 MG/0.4ML injection Inject 0.4 mLs (40 mg total) into the skin daily. 01/24/16   Vaughan Basta, MD  folic acid (FOLVITE) 734 MCG tablet Take 400 mcg by mouth daily.    [provider]  HYDROcodone-acetaminophen (NORCO) 7.5-325 MG tablet Take 1 tablet by mouth every 6 (six) hours as needed for moderate pain. 01/23/16   Vaughan Basta, MD  lamoTRIgine (LAMICTAL) 25 MG tablet Take 25 mg by mouth at bedtime.     [provider]  lamoTRIgine (LAMICTAL) 25 MG tablet Take 2 tablets (50 mg total) by mouth at bedtime. 01/23/16   Vaughan Basta, MD  lisinopril (PRINIVIL,ZESTRIL) 2.5 MG tablet Take 2.5 mg by mouth daily.    [provider]  Magnesium 500 MG CAPS Take 500 mg by mouth every evening.     [provider]  Melatonin 10 MG TABS Take 10 mg by mouth at bedtime.    [provider]  Misc Natural Products (LUTEIN 20 PO) Take 1 capsule by mouth every evening.    [provider]  Multiple Vitamins-Minerals (CENTRUM  SILVER PO) Take 1 tablet by mouth daily.    [provider]  senna-docusate (SENOKOT-S) 8.6-50 MG tablet Take 1 tablet by mouth at bedtime as needed for mild constipation. 01/23/16   Vaughan Basta, MD  Specialty Vitamins Products (BILBERRY PLUS PO) Take 1 tablet by mouth daily.    [provider]  trimethoprim-polymyxin b (POLYTRIM) ophthalmic solution Place 1 drop into the left eye every 4 (four) hours. 01/23/16   Vaughan Basta, MD    Allergies Cephalexin; Clindamycin/lincomycin; and Morphine and related  Family History  Problem Relation Age of Onset   . Heart disease Father   . Bipolar disorder Brother     Social History Social History  Substance Use Topics  . Smoking status: Never Smoker  . Smokeless tobacco: Never Used  . Alcohol use No    Review of Systems Constitutional: No fever/chills Eyes: No visual changes. ENT: No sore throat. Cardiovascular: Denies chest pain. Respiratory: Denies shortness of breath. Gastrointestinal: No abdominal pain.  No nausea, no vomiting.  No diarrhea.   Genitourinary: Negative for dysuria. Musculoskeletal: Positive for left leg pain. Skin: Negative for rash. Neurological: Negative for headaches, focal weakness or numbness.  ____________________________________________   PHYSICAL EXAM:  VITAL SIGNS: ED Triage Vitals  Enc Vitals Group     BP 08/30/16 0040 128/72     Pulse Rate 08/30/16 0040 60     Resp 08/30/16 0040 11     Temp 08/30/16 0040 98.3 F (36.8 C)     Temp Source 08/30/16 0040 Oral     SpO2 08/30/16 0040 100 %     Weight 08/30/16 0038 158 lb (71.7 kg)     Height 08/30/16 0038 5\' 10"  (1.778 m)     Head Circumference --      Peak Flow --      Pain Score 08/30/16 0037 10    Constitutional: Alert and oriented. Well appearing and in no distress. Eyes: Conjunctivae are normal.  ENT   Head: Normocephalic and atraumatic.   Nose: No congestion/rhinnorhea.   Mouth/Throat: Mucous membranes are moist.   Neck: No stridor. Hematological/Lymphatic/Immunilogical: No cervical lymphadenopathy. Cardiovascular: Normal rate, regular rhythm.  No murmurs, rubs, or gallops. Respiratory: Normal respiratory effort without tachypnea nor retractions. Breath sounds are clear and equal bilaterally. No wheezes/rales/rhonchi. Gastrointestinal: Soft and non tender. No rebound. No guarding.  Genitourinary: Deferred Musculoskeletal: Deformity to left distal thigh. Tender to palpation. NV intact distally. Neurologic:  Normal speech and language. No gross focal neurologic deficits are  appreciated.  Skin:  Skin is warm, dry and intact. No rash noted. Psychiatric: Mood and affect are normal. Speech and behavior are normal. Patient exhibits appropriate insight and judgment.  ____________________________________________    LABS (pertinent positives/negatives)  Labs Reviewed  CBC WITH DIFFERENTIAL/PLATELET - Abnormal; Notable for the following:       Result Value   RBC 4.15 (*)    HCT 39.0 (*)    RDW 15.4 (*)    All other components within normal limits  BASIC METABOLIC PANEL - Abnormal; Notable for the following:    Glucose, Bld 106 (*)    All other components within normal limits     ____________________________________________   EKG  None  ____________________________________________    RADIOLOGY  Left femur IMPRESSION: Acute displaced distal femur fracture, fracture extends to femoral component of total knee arthroplasty.  No dislocation.  ____________________________________________   PROCEDURES  Procedures  ____________________________________________   INITIAL IMPRESSION / ASSESSMENT AND PLAN / ED COURSE  Pertinent  labs & imaging results that were available during my care of the patient were reviewed by me and considered in my medical decision making (see chart for details).  Patient presented to the emergency department today because of concerns for left leg pain after fall.-rays do show a fracture. Will plan on admission.  ____________________________________________   FINAL CLINICAL IMPRESSION(S) / ED DIAGNOSES  Final diagnoses:  Fall  Other closed fracture of left femur, unspecified portion of femur, initial encounter [facility])     Note: This dictation was prepared with Dragon dictation. Any transcriptional errors that result from this process are unintentional     Nance Pear, MD 08/30/16 [phone]

## 2016-08-30 NOTE — OR Nursing (Signed)
Put in for a cardiology consult and the hospitalist to see patient per Dr.Karenz

## 2016-08-30 NOTE — ED Notes (Signed)
Contacted Eddie at [facility] in order to let spouse know about surgery needed and pt being set to admit to [facility]

## 2016-08-30 NOTE — Consult Note (Addendum)
Spiral distal femur fracture, plan ORIF later today   Patient seen with daughter present. He has significant confusion and got had gotten out of bed when he really should've. He had a hip fracture back in January and has been having difficulty walking since then. On exam his left leg is externally rotated and shortened with deformity just above the knee consistent with radiographs. His prior midline skin incision from prior total knee. No palpable pulses at the foot. He did not really able to cooperate with sensory or neurologic exam.  X-rays show a long spiral fracture that extends to just below the prior femoral prosthesis from hip hemiarthroplasty. Total knee appears to be stable in appearance.  Impression is spiral distal tibia fracture periprosthetic distal femur and below prior right hip prosthesis  Plan is for ORIF of the subcutaneous plating technique locking plate will try to get the plate to the level of the prosthesis to a stress riser between, he will be weightbearing as tolerated postoperatively. Risks benefits and possible complications discussed with daughter

## 2016-08-30 NOTE — ED Notes (Signed)
Per [facility], VS were CBG 125, BP 141/75, and pulse of 61.

## 2016-08-30 NOTE — Clinical Social Work Note (Signed)
Clinical Social Work Assessment  Patient Details  Name: Stephen Roth. MRN: [id number] Date of Birth: 07-04-1925  Date of referral:  08/30/16               Reason for consult:  Facility Placement                Permission sought to share information with:  Facility Art therapist granted to share information::  Yes, Verbal Permission Granted  Name::        Agency::     Relationship::     Contact Information:     Housing/Transportation Living arrangements for the past 2 months:  [place] of Information:  Adult Children, Power of [place], Spouse Patient Interpreter Needed:  None Criminal Activity/Legal Involvement Pertinent to Current Situation/Hospitalization:  No - Comment as needed Significant Relationships:  Adult Children, Church, [employer], Spouse Lives with:  Facility Resident Do you feel safe going back to the place where you live?  Yes Need for family participation in patient care:  No ([place])  Care giving concerns:  PT recommendation for STR.   Social Worker assessment / plan:  The CSW received a verbal consult from the orthopedic surgeon that the patient would require SNF per PT. CSW visited with the patient's pastor, spouse, and daughter/HCPOA in the patient's room while the patient was in post-op to discuss discharge planning. The patient's daughter gave verbal permission to conduct a STR referral and named [facility] as the preference as the patient has been at the facility in the past.  At baseline, the patient lives with his wife in an ALF ([facility]). The discharge plan is for Monday at the earliest due to potential for post-op delirium. CSW will make bed offers as they are available and facilitate discharge.  Employment status:  Retired Nurse, adult PT Recommendations:  [place] / Referral to community resources:  [place]  Patient/Family's Response to care:  Patient's family thanked CSW for assistance.  Patient/Family's Understanding of and Emotional Response to Diagnosis, Current Treatment, and Prognosis:  The patient's family understands the need for care at the SNF level and are in agreement.  Emotional Assessment Appearance:  Appears stated age Attitude/Demeanor/Rapport:  Other, Unable to Assess (Patient was in surgery.) Affect (typically observed):  Unable to Assess, Other (Patient was in surgery.) Orientation:  Oriented to Self, Oriented to  Time, Oriented to Situation Alcohol / Substance use:  Never Used Psych involvement (Current and /or in the community):  No (Comment)  Discharge Needs  Concerns to be addressed:  Care Coordination, Discharge Planning Concerns Readmission within the last 30 days:  No Current discharge risk:  None Barriers to Discharge:  Continued Medical Work up   [employer], LCSW 08/30/2016, 4:18 PM

## 2016-08-30 NOTE — Anesthesia Procedure Notes (Signed)
Procedure Name: Intubation Date/Time: 08/30/2016 1:33 PM Performed by: Johnna Acosta Pre-anesthesia Checklist: Patient identified, Emergency Drugs available, Suction available, Patient being monitored and Timeout performed Patient Re-evaluated:Patient Re-evaluated prior to induction Oxygen Delivery Method: Circle system utilized Preoxygenation: Pre-oxygenation with 100% oxygen Induction Type: IV induction Ventilation: Mask ventilation without difficulty Laryngoscope Size: Miller and 2 Grade View: Grade I Tube type: Oral Tube size: 7.5 mm Number of attempts: 1 Airway Equipment and Method: Stylet Placement Confirmation: ETT inserted through vocal cords under direct vision,  positive ETCO2 and breath sounds checked- equal and bilateral Secured at: 21 cm Tube secured with: Tape Dental Injury: Teeth and Oropharynx as per pre-operative assessment

## 2016-08-31 LAB — COMPREHENSIVE METABOLIC PANEL
ALK PHOS: 58 U/L (ref 38–126)
ALT: 13 U/L — AB (ref 17–63)
AST: 22 U/L (ref 15–41)
Albumin: 2.7 g/dL — ABNORMAL LOW (ref 3.5–5.0)
Anion gap: 4 — ABNORMAL LOW (ref 5–15)
BILIRUBIN TOTAL: 0.8 mg/dL (ref 0.3–1.2)
BUN: 15 mg/dL (ref 6–20)
CALCIUM: 8.5 mg/dL — AB (ref 8.9–10.3)
CHLORIDE: 109 mmol/L (ref 101–111)
CO2: 28 mmol/L (ref 22–32)
CREATININE: 0.67 mg/dL (ref 0.61–1.24)
Glucose, Bld: 132 mg/dL — ABNORMAL HIGH (ref 65–99)
Potassium: 4.2 mmol/L (ref 3.5–5.1)
SODIUM: 141 mmol/L (ref 135–145)
TOTAL PROTEIN: 5 g/dL — AB (ref 6.5–8.1)

## 2016-08-31 MED ORDER — LAMOTRIGINE 25 MG PO TABS
25.0000 mg | ORAL_TABLET | Freq: Every day | ORAL | Status: DC
  Administered 2016-09-01 – 2016-09-02 (×2): 25 mg via ORAL
  Filled 2016-08-31 (×3): qty 1

## 2016-08-31 NOTE — Progress Notes (Signed)
[place] [facility] NAME: Stephen Roth    MR#:  [id number]  DATE OF BIRTH:  21-Jun-1925  SUBJECTIVE:  CHIEF COMPLAINT:   Chief Complaint  Patient presents with  . Fall   Pain well controlled. Patient is pleasantly confused. Eating breakfast. Postop day 1  REVIEW OF SYSTEMS:    Review of Systems  Unable to perform ROS: Dementia    DRUG ALLERGIES:   Allergies  Allergen Reactions  . Cephalexin Other (See Comments)    Confusion, hives, hallucinatons  . Clindamycin/Lincomycin Other (See Comments)    Confusion, hives, hallucinations  . Morphine And Related Other (See Comments)    VITALS:  Blood pressure (!) 100/52, pulse 61, temperature 98.5 F (36.9 C), temperature source Oral, resp. rate 18, height 5\' 10"  (1.778 m), weight 70 kg (154 lb 6.4 oz), SpO2 100 %.  PHYSICAL EXAMINATION:   Physical Exam  GENERAL:  83 y.o.-year-old patient lying in the bed with no acute distress.  EYES: Pupils equal, round, reactive to light and accommodation. No scleral icterus. Extraocular muscles intact.  HEENT: Head atraumatic, normocephalic. Oropharynx and nasopharynx clear.  NECK:  Supple, no jugular venous distention. No thyroid enlargement, no tenderness.  LUNGS: Normal breath sounds bilaterally, no wheezing, rales, rhonchi. No use of accessory muscles of respiration.  CARDIOVASCULAR: S1, S2 normal. No murmurs, rubs, or gallops.  ABDOMEN: Soft, nontender, nondistended. Bowel sounds present. No organomegaly or mass.  EXTREMITIES: Left thigh dressing. Old blood under dressing. NEUROLOGIC: Cranial nerves II through XII are intact. No focal Motor or sensory deficits b/l.   PSYCHIATRIC: The patient is alert and awake. Pleasantly confused SKIN: No obvious rash, lesion, or ulcer.   LABORATORY PANEL:   CBC  Recent Labs Lab 08/30/16 0432  WBC 10.2  HGB 12.9*  HCT 37.2*  PLT 253    ------------------------------------------------------------------------------------------------------------------ Chemistries   Recent Labs Lab 08/31/16 0329  NA 141  K 4.2  CL 109  CO2 28  GLUCOSE 132*  BUN 15  CREATININE 0.67  CALCIUM 8.5*  AST 22  ALT 13*  ALKPHOS 58  BILITOT 0.8   ------------------------------------------------------------------------------------------------------------------  Cardiac Enzymes No results for input(s): TROPONINI in the last 168 hours. ------------------------------------------------------------------------------------------------------------------  RADIOLOGY:  Dg C-arm 61-120 Min-no Report  Result Date: 08/30/2016 Fluoroscopy was utilized by the requesting physician.  No radiographic interpretation.   Dg Femur Min 2 Views Left  Result Date: 08/30/2016 CLINICAL DATA:  Left femur fracture EXAM: DG C-ARM 61-120 MIN-NO REPORT; LEFT FEMUR 2 VIEWS FLUOROSCOPY TIME:  Fluoroscopy Time:  4 minutes COMPARISON:  None. FINDINGS: Oblique distal femoral diaphysis fracture transfixed with a lateral sideplate and multiple interlocking screws. Superior most interlocking screw is fractured. Fracture is in near anatomic alignment. Postsurgical changes in the surrounding soft tissues. There is evidence of prior left hip arthroplasty and total knee arthroplasties. IMPRESSION: 1. Interval distal femoral fracture ORIF. Electronically Signed   By: Kathreen Devoid   On: 08/30/2016 16:23   Dg Femur Min 2 Views Left  Result Date: 08/30/2016 CLINICAL DATA:  [place] from bed.  History of LEFT hip arthroplasty. EXAM: LEFT FEMUR 2 VIEWS COMPARISON:  None. FINDINGS: Acute distal femur fracture and with impaction, fracture extends to the femoral component of total knee arthroplasty. Posterior angulation distal bony fragments. Status post LEFT hip hemiarthroplasty, femoral head intact. No dislocation. Heterotopic ossification lesser trochanter. Osteopenia without destructive  bony lesions. Phleboliths project in LEFT pelvis. Moderate vascular calcifications. IMPRESSION: Acute displaced distal femur fracture, fracture  extends to femoral component of total knee arthroplasty. No dislocation. Electronically Signed   By: Elon Alas M.D.   On: 08/30/2016 01:40     ASSESSMENT AND PLAN:   * OPEN REDUCTION INTERNAL FIXATION (ORIF) DISTAL FEMUR FRACTURE (Left) due to accidental fall Postop day 1. Stop IV fluids. Pain medications as needed. Physical therapy to work with patient. Likely discharge to rehabilitation tomorrow. Monitor for blood loss anemia.  * Chronic systolic CHF with ejection fraction of 25%. Stable. No signs of fluid overload.  Not on beta blockers or ACE inhibitor's are are due to low normal blood pressure at home. Lasix when necessary if there is any fluid overload  * Cirrhosis of the liver. Stable  * Dementia. Watch for inpatient delirium. Pleasant today.  * DVT prophylaxis with Lovenox  All the records are reviewed and case discussed with Care Management/Social Worker Management plans discussed with the patient, family and they are in agreement.  CODE STATUS: DO NOT RESUSCITATE  DVT Prophylaxis: SCDs  TOTAL TIME TAKING CARE OF THIS PATIENT: 30 minutes.   POSSIBLE D/C IN 1-2 DAYS, DEPENDING ON CLINICAL CONDITION.  Hillary Bow R M.D on 08/31/2016 at 10:47 AM  Between 7am to 6pm - Pager - [phone]  After 6pm go to [email or web address] - password [email or web address] [facility]  [facility] [place] [facility]  Office  [phone]  CC: Primary care physician; Adin Hector, MD  Note: This dictation was prepared with Dragon dictation along with smaller phrase technology. Any transcriptional errors that result from this process are unintentional.

## 2016-08-31 NOTE — Progress Notes (Signed)
   Subjective: 1 Day Post-Op Procedure(s) (LRB): OPEN REDUCTION INTERNAL FIXATION (ORIF) DISTAL FEMUR FRACTURE (Left) Patient reports pain as moderate.   Patient is well, and has had no acute complaints or problems We will start therapy today.  Plan is to go Rehab after hospital stay. no nausea and no vomiting Patient denies any chest pains or shortness of breath. Objective: Vital signs in last 24 hours: Temp:  [97.9 F (36.6 C)-98.5 F (36.9 C)] 98.5 F (36.9 C) (08/19 0740) Pulse Rate:  [47-129] 61 (08/19 0740) Resp:  [10-20] 18 (08/19 0740) BP: (87-114)/(52-65) 100/52 (08/19 0740) SpO2:  [83 %-100 %] 100 % (08/19 0740) well approximated incision Heels are non tender and elevated off the bed using rolled towels Intake/Output from previous day: 08/18 0701 - 08/19 0700 In: 1769.5 [I.V.:1619.5; IV Piggyback:150] Out: 800 [Urine:800] Intake/Output this shift: No intake/output data recorded.   Recent Labs  08/30/16 0041 08/30/16 0432  HGB 13.6 12.9*    Recent Labs  08/30/16 0041 08/30/16 0432  WBC 7.8 10.2  RBC 4.15* 3.85*  HCT 39.0* 37.2*  PLT 265 253    Recent Labs  08/30/16 0432 08/31/16 0329  NA 145 141  K 3.9 4.2  CL 108 109  CO2 31 28  BUN 16 15  CREATININE 0.87 0.67  GLUCOSE 132* 132*  CALCIUM 9.1 8.5*   No results for input(s): LABPT, INR in the last 72 hours.  EXAM General - Patient is Alert, Appropriate and Oriented . Patient very hard of hearing Extremity - Neurologically intact Neurovascular intact Sensation intact distally Intact pulses distally No cellulitis present Compartment soft Is noted to have some swelling proximal to the knee. Dressing - scant drainage Motor Function - intact, moving foot and toes well on exam.    Past Medical History:  Diagnosis Date  . Cancer ([place])    basal cell on eyelid  . CHF (congestive heart failure) ([place])   . Cirrhosis ([place])   . Hypertension   . Normal pressure hydrocephalus   . Sleep apnea      Assessment/Plan: 1 Day Post-Op Procedure(s) (LRB): OPEN REDUCTION INTERNAL FIXATION (ORIF) DISTAL FEMUR FRACTURE (Left) Active Problems:   Hip fracture ([facility])   Femur fracture, left ([facility])  Estimated body mass index is 22.15 kg/m as calculated from the following:   Height as of this encounter: 5\' 10"  (1.778 m).   Weight as of this encounter: 70 kg (154 lb 6.4 oz). Advance diet Up with therapy D/C IV fluids Plan for discharge tomorrow Discharge to SNF  Labs: Were reviewed DVT Prophylaxis - Lovenox Weight-Bearing as tolerated to left leg D/C O2 and Pulse OX and try on Room Air Begin working on bowel movement  Genna Casimir R. [place] 08/31/2016, 7:56 AM

## 2016-08-31 NOTE — Care Management Note (Signed)
Case Management Note  Patient Details  Name: Stephen Roth. MRN: [id number] Date of Birth: Feb 26, 1925  Subjective/Objective:     Mr Stephen Roth is from [facility]. Discussed discharge planning with both Dr Rudene Christians and with [name], his daughter [name], and his wife. They all in agreement that [name] will be discharged to Rehab and the family requests [facility].  CSW is following for placement. Case manager is signing off unless new discharge needs arise.                Action/Plan:   Expected Discharge Date:  09/02/16               Expected Discharge Plan:     In-House Referral:     Discharge planning Services     Post Acute Care Choice:    Choice offered to:     DME Arranged:    DME Agency:     HH Arranged:    HH Agency:     Status of Service:     If discussed at [employer], dates discussed:    Additional Comments:  Kaylena Pacifico A, RN 08/31/2016, 12:28 PM

## 2016-09-01 ENCOUNTER — Encounter: Payer: Self-pay | Admitting: Orthopedic Surgery

## 2016-09-01 LAB — HEMOGLOBIN: Hemoglobin: 11.2 g/dL — ABNORMAL LOW (ref 13.0–18.0)

## 2016-09-01 MED ORDER — IBUPROFEN 400 MG PO TABS
400.0000 mg | ORAL_TABLET | Freq: Once | ORAL | Status: AC
  Administered 2016-09-01: 400 mg via ORAL
  Filled 2016-09-01: qty 1

## 2016-09-01 NOTE — Evaluation (Signed)
Physical Therapy Evaluation Patient Details Name: Stephen Roth. MRN: [id number] DOB: 25-Feb-1925 Today's Date: 09/01/2016   History of Present Illness  Pt sufferred a fall with a distal L femur fracture. He is s/p ORIF and is POD#2 at time of PT evaluation. Order for PT was not received until 09/01/16 11:24. PMH includes dementia, CHF, HTN, L hip fracture, and pacemaker.   Clinical Impression  Pt admitted with above diagnosis. Pt currently with functional limitations due to the deficits listed below (see PT Problem List).  Pt requiring minA+1 for bed mobility and mod/maxA+1 for sit to stand transfers from bed to recliner. Cues for safe hand placement during transfer as well as weight shifting forward. Once upright pt places minimal weight on LLE due to increase in pain. He is able to perform stand pivot transfer to recliner but requires heavy assist. Very small amount of bleeding from the distal portion of incision following transfer. RN made aware. Unable/unsafe to attempt ambulation at this time. Pt is able to follow all commands for bed exercises on this date. He will need SNF placement at discharge given his decline from baseline. Pt was previously independent with bed mobility and CGA/minA+1 for transfers with staff at his facility. His current deficits significantly impair his ability to perform mobility safely at his facility. Pt will benefit from PT services to address deficits in strength, balance, and mobility in order to return to full function at his facility.      Follow Up Recommendations SNF    Equipment Recommendations  None recommended by PT    Recommendations for Other Services OT consult     Precautions / Restrictions Precautions Precautions: Fall Restrictions Weight Bearing Restrictions: Yes LLE Weight Bearing: Weight bearing as tolerated      Mobility  Bed Mobility Overal bed mobility: Needs Assistance Bed Mobility: Supine to Sit     Supine to sit: Min  assist     General bed mobility comments: Pt requires minA+1 and cues for rolling and to push up to sitting. Use of bed rails and HOB elevated  Transfers Overall transfer level: Needs assistance Equipment used: None Transfers: Sit to/from Stand Sit to Stand: Max assist         General transfer comment: Pt requiring mod/maxA+1 for sit to stand transfers. Cues for safe hand placement during transfer as well as weight shifting forward. Once upright pt places minimal weight on LLE due to increase in pain. He is able to perform stand pivot transfer to recliner but requires heavy assist. Very small amount of bleeding from the distal portion of incision following transfer. RN made aware. Unable/unsafe to attempt ambulation at this time  Ambulation/Gait             General Gait Details: Unable/unsafe to attempt  Stairs            Wheelchair Mobility    Modified Rankin (Stroke Patients Only)       Balance Overall balance assessment: Needs assistance Sitting-balance support: No upper extremity supported Sitting balance-Leahy Scale: Good     Standing balance support: No upper extremity supported Standing balance-Leahy Scale: Poor Standing balance comment: Requires mod/maxA+1 to remain upright in standing                             Pertinent Vitals/Pain Pain Assessment: Faces Faces Pain Scale: Hurts little more Pain Location: Pt denies pain at rest and with bed exercises. Pt with  signs of pain during transfers Pain Intervention(s): Monitored during session    [place] expects to be discharged to:: Skilled nursing facility                 Additional Comments: [facility]    Prior Function Level of Independence: Needs assistance      ADL's / Homemaking Assistance Needed: Assist required for all ADLs/IADLs  Comments: Pt at [facility]. He mostly remain in wheelchair due to frequent falls and performs transfers with  assistance from staff at mostly CGA/minA level. Per wife pt sufferred a L hip fracture in January 2018. Daughter states that he was able to ambulate up to 200' with CGA/minA+1 from therapy following his hip fracture but remains in wheelchair for safety. He has a call bell that he wears but frequently forgets to use it     Hand Dominance   Dominant Hand: Right    Extremity/Trunk Assessment   Upper Extremity Assessment Upper Extremity Assessment: Generalized weakness    Lower Extremity Assessment Lower Extremity Assessment: LLE deficits/detail LLE Deficits / Details: Pt reports intact sensation to light touch throughout LLE. Full DF/PF. Able to perform SLR with 2 finger assist. Requires considerabel assist for SAQ but able to illicit quad contraction during quad sets       Communication   Communication: No difficulties  Cognition Arousal/Alertness: Awake/alert Behavior During Therapy: WFL for tasks assessed/performed Overall Cognitive Status: History of cognitive impairments - at baseline                                 General Comments: Pt is AOx1 at time of PT evaluation. He is able to provide DOB but is disoriented to his location as well as date and situation      General Comments      Exercises Total Joint Exercises Ankle Circles/Pumps: AROM;Both;10 reps;Supine Quad Sets: Strengthening;Both;10 reps;Supine Gluteal Sets: Strengthening;Both;10 reps;Supine Towel Squeeze: Strengthening;Both;10 reps;Supine Short Arc Quad: Strengthening;Left;10 reps;Supine Hip ABduction/ADduction: Strengthening;Left;10 reps;Supine Straight Leg Raises: Strengthening;Left;10 reps;Supine   Assessment/Plan    PT Assessment Patient needs continued PT services  PT Problem List Decreased strength;Decreased balance;Decreased mobility;Decreased cognition;Decreased knowledge of use of DME;Pain       PT Treatment Interventions DME instruction;Gait training;Functional mobility  training;Therapeutic activities;Therapeutic exercise;Balance training;Neuromuscular re-education;Patient/family education;Cognitive remediation;Manual techniques;Wheelchair mobility training    PT Goals (Current goals can be found in the Care Plan section)  Acute Rehab PT Goals Patient Stated Goal: Improve strength, function, and safety PT Goal Formulation: With family Time For Goal Achievement: 09/15/16 Potential to Achieve Goals: Fair    Frequency BID   Barriers to discharge        Co-evaluation               AM-PAC PT "6 Clicks" Daily Activity  Outcome Measure Difficulty turning over in bed (including adjusting bedclothes, sheets and blankets)?: Unable Difficulty moving from lying on back to sitting on the side of the bed? : Unable Difficulty sitting down on and standing up from a chair with arms (e.g., wheelchair, bedside commode, etc,.)?: Unable Help needed moving to and from a bed to chair (including a wheelchair)?: A Lot Help needed walking in hospital room?: Total Help needed climbing 3-5 steps with a railing? : Total 6 Click Score: 7    End of Session Equipment Utilized During Treatment: Gait belt Activity Tolerance: Patient tolerated treatment well Patient left: in chair;with call bell/phone  within reach;with chair alarm set;with SCD's reapplied Nurse Communication: Mobility status PT Visit Diagnosis: Unsteadiness on feet (R26.81);Muscle weakness (generalized) (M62.81);History of falling (Z91.81);Repeated falls (R29.6);Pain Pain - Right/Left: Left Pain - part of body: Leg    Time: 1206-1242 PT Time Calculation (min) (ACUTE ONLY): 36 min   Charges:   PT Evaluation $PT Eval Moderate Complexity: 1 Mod PT Treatments $Therapeutic Exercise: 8-22 mins   PT G Codes:   PT G-Codes **NOT FOR INPATIENT CLASS** Functional Assessment Tool Used: AM-PAC 6 Clicks Basic Mobility Functional Limitation: Mobility: Walking and moving around Mobility: Walking and Moving  Around Current Status (G7618): At least 80 percent but less than 100 percent impaired, limited or restricted Mobility: Walking and Moving Around Goal Status [id number]): At least 40 percent but less than 60 percent impaired, limited or restricted    Phillips Grout PT, DPT    Tayvion Lauder 09/01/2016, 1:07 PM

## 2016-09-01 NOTE — Clinical Social Work Placement (Signed)
   CLINICAL SOCIAL WORK PLACEMENT  NOTE  Date:  09/01/2016  Patient Details  Name: Stephen Roth. MRN: [id number] Date of Birth: 11-12-1925  Clinical Social Work is seeking post-discharge placement for this patient at the [place] level of care (*CSW will initial, date and re-position this form in  chart as items are completed):  Yes   Patient/family provided with [facility] Work Department's list of facilities offering this level of care within the geographic area requested by the patient (or if unable, by the patient's family).  Yes   Patient/family informed of their freedom to choose among providers that offer the needed level of care, that participate in Medicare, Medicaid or managed care program needed by the patient, have an available bed and are willing to accept the patient.  Yes   Patient/family informed of [facility]'s ownership interest in [facility] and [facility], as well as of the fact that they are under no obligation to receive care at these facilities.  PASRR submitted to EDS on       PASRR number received on       Existing PASRR number confirmed on 08/30/16     FL2 transmitted to all facilities in geographic area requested by pt/family on 08/30/16     FL2 transmitted to all facilities within larger geographic area on       Patient informed that his/her managed care company has contracts with or will negotiate with certain facilities, including the following:        Yes   Patient/family informed of bed offers received.  Patient chooses bed at  ([facility] )     Physician recommends and patient chooses bed at      Patient to be transferred to   on  .  Patient to be transferred to facility by       Patient family notified on   of transfer.  Name of family member notified:        PHYSICIAN Please sign DNR     Additional Comment:    _______________________________________________ Norah Devin, Veronia Beets,  LCSW 09/01/2016, 11:39 AM

## 2016-09-01 NOTE — Consult Note (Signed)
[facility]  CARDIOLOGY CONSULT NOTE  Patient ID: Stephen Roth. MRN: [id number] DOB/AGE: 1925/09/17 83 y.o.  Admit date: 08/30/2016 Referring Physician Dr. Serita Grit Primary Physician Dr. Ramonita Lab Primary Cardiologist Dr. Ubaldo Glassing Reason for Consultation irregular heart ate.   HPI: Patient is a 83 year old male with history of chronic systolic heart failure with ejection fraction of approximately 20%. He has an AICD in place. He was admitted with hip pain after a mechanical fall. There is a distal femoral fracture on the left side. He underwent surgical repair of this and was felt to have transient atrial fibrillation perioperatively. Electrocardiogram done on day of admission showed atrial pacing ventricular sensing. He has had no syncope or presyncope. Patient is a difficult historian somewhat due to relative dementia however appear stable at present. He has been relatively hypotensive on lisinopril. This had been removed as an outpatient. There is no evidence of heart failure. He is doing fairly well postop at present.  Review of Systems  Constitutional: Negative.   HENT: Negative.   Eyes: Negative.   Respiratory: Negative.   Cardiovascular: Negative.   Gastrointestinal: Negative.   Genitourinary: Negative.   Musculoskeletal: Positive for joint pain.  Skin: Negative.   Neurological: Negative.   Endo/Heme/Allergies: Negative.   Psychiatric/Behavioral: Positive for memory loss.    Past Medical History:  Diagnosis Date  . Cancer ([place])    basal cell on eyelid  . CHF (congestive heart failure) ([place])   . Cirrhosis ([place])   . Hypertension   . Normal pressure hydrocephalus   . Sleep apnea     Family History  Problem Relation Age of Onset  . Heart disease Father   . Bipolar disorder Brother     Social History   Social History  . Marital status: Married    Spouse name: N/A  . Number of children: N/A  . Years of education:  N/A   Occupational History  . retired    Social History Main Topics  . Smoking status: Never Smoker  . Smokeless tobacco: Never Used  . Alcohol use No  . Drug use: No  . Sexual activity: Not on file   Other Topics Concern  . Not on file   Social History Narrative  . No narrative on file    Past Surgical History:  Procedure Laterality Date  . HERNIA REPAIR    . HIP ARTHROPLASTY Left 01/20/2016   Procedure: ARTHROPLASTY BIPOLAR HIP (HEMIARTHROPLASTY);  Surgeon: Corky Mull, MD;  Location: [facility] ORS;  Service: Orthopedics;  Laterality: Left;  . IMPLANTABLE CARDIOVERTER DEFIBRILLATOR (ICD) GENERATOR CHANGE Left 07/13/2015   Procedure: ICD GENERATOR CHANGE;  Surgeon: Marzetta Board, MD;  Location: [facility] ORS;  Service: Cardiovascular;  Laterality: Left;  . JOINT REPLACEMENT Bilateral    knees  . PACEMAKER INSERTION  2006   Pace maker, defibulator     Prescriptions Prior to Admission  Medication Sig Dispense Refill Last Dose  . aspirin EC 81 MG tablet Take 81 mg by mouth daily.   08/29/2016 at 0800  . Cholecalciferol (VITAMIN D-3 PO) Take 1,000 Units by mouth every evening.    08/29/2016 at 0800  . Docusate Calcium (STOOL SOFTENER PO) Take 1 tablet by mouth 2 (two) times daily.   08/29/2016 at 2100  . dutasteride (AVODART) 0.5 MG capsule Take 0.5 mg by mouth daily.   08/29/2016 at 0800  . lamoTRIgine (LAMICTAL) 25 MG tablet Take 2 tablets (50 mg total)  by mouth at bedtime. (Patient taking differently: Take 25 mg by mouth at bedtime. ) 30 tablet 0 08/29/2016 at 0800  . Misc Natural Products (LUTEIN 20 PO) Take 1 capsule by mouth every evening.   08/29/2016 at 0800  . acetaminophen (TYLENOL) 325 MG tablet Take by mouth every 6 (six) hours as needed.   prn at prn  . bisacodyl (DULCOLAX) 10 MG suppository Place 1 suppository (10 mg total) rectally daily as needed for moderate constipation. 12 suppository 0 prn at prn  . senna-docusate (SENOKOT-S) 8.6-50 MG tablet Take 1 tablet by mouth at bedtime  as needed for mild constipation. 20 tablet 0 prn at prn    Physical Exam: Blood pressure 104/67, pulse 65, temperature 97.9 F (36.6 C), temperature source Oral, resp. rate 18, height 5\' 10"  (1.778 m), weight 70 kg (154 lb 6.4 oz), SpO2 99 %.   Wt Readings from Last 1 Encounters:  08/30/16 70 kg (154 lb 6.4 oz)     General appearance: alert and cooperative Resp: clear to auscultation bilaterally Cardio: regular rate and rhythm GI: soft, non-tender; bowel sounds normal; no masses,  no organomegaly Extremities: Hip pain status post surgery Neurologic: Grossly normal  Labs:   Lab Results  Component Value Date   WBC 10.2 08/30/2016   HGB 11.2 (L) 09/01/2016   HCT 37.2 (L) 08/30/2016   MCV 96.5 08/30/2016   PLT 253 08/30/2016    Recent Labs Lab 08/31/16 0329  NA 141  K 4.2  CL 109  CO2 28  BUN 15  CREATININE 0.67  CALCIUM 8.5*  PROT 5.0*  BILITOT 0.8  ALKPHOS 58  ALT 13*  AST 22  GLUCOSE 132*   No results found for: CKTOTAL, CKMB, CKMBINDEX, TROPONINI   EKG: Atrial pacing ventricular sensing on 12  ASSESSMENT AND PLAN:  83 year old male with history of chronic systolic heart failure and history of AICD in place. He has had recent hip fracture. He was noted to have what was felt to be atrial fibrillation during surgery. EKG preop showed atrial pacing ventricular sensing. He currently is hemodynamically stable. Rhythm appears normal by exam. Will need a 12-lead electrocardiogram to evaluate current rhythm. Patient is not candidate for anticoagulation at present given fall history. Renal function appears normal. Will discontinue lisinopril due to relative hypotension. Further recommendations based on results of this twelve-lead electrocardiogram be forthcoming. Patient should be able to discharge to rehabilitation facility from a cardiac standpoint Signed: Teodoro Spray MD, [facility] 09/01/2016, 8:53 AM

## 2016-09-01 NOTE — Progress Notes (Signed)
Physical Therapy Treatment Patient Details Name: Stephen Roth. MRN: [id number] DOB: 1925/04/18 Today's Date: 09/01/2016    History of Present Illness Pt sufferred a fall with a distal L femur fracture. He is s/p ORIF and is POD#2 at time of PT evaluation. Order for PT was not received until 09/01/16 11:24. PMH includes dementia, CHF, HTN, L hip fracture, and pacemaker.     PT Comments    Pt continues to require heavy assist to transfer from recliner back to bed. He is able to complete all supine exercises as instructed. Pt continues to demonstrates quad weakness with SAQ but good hip flexor strength with SLR. Daughter present at end of session to discuss discharge recommendations and progress. Pt will benefit from PT services to address deficits in strength, balance, and mobility in order to return to full function at home.    Follow Up Recommendations  SNF     Equipment Recommendations  None recommended by PT    Recommendations for Other Services OT consult     Precautions / Restrictions Precautions Precautions: Fall Restrictions Weight Bearing Restrictions: Yes LLE Weight Bearing: Weight bearing as tolerated    Mobility  Bed Mobility Overal bed mobility: Needs Assistance Bed Mobility: Sit to Supine       Sit to supine: Min assist   General bed mobility comments: Pt requires minA+1 for LE assist. Overhead trapeze to reposition in bed  Transfers Overall transfer level: Needs assistance Equipment used: None Transfers: Sit to/from Stand Sit to Stand: Max assist         General transfer comment: Pt requiring mod/maxA+1 for sit to stand transfers to return to bed this afternoon. He continues to require cues for safe hand placement during transfer as well as weight shifting forward. Once upright pt continues to place minimal weight on LLE due to increase in pain with gramicing and groaning. No more bleeding noted from incision this afternoon. Unable/unsafe to  attempt ambulation at this time  Ambulation/Gait             General Gait Details: Unable/unsafe to attempt   Stairs            Wheelchair Mobility    Modified Rankin (Stroke Patients Only)       Balance Overall balance assessment: Needs assistance Sitting-balance support: No upper extremity supported Sitting balance-Leahy Scale: Good     Standing balance support: No upper extremity supported Standing balance-Leahy Scale: Poor Standing balance comment: Requires mod/maxA+1 to remain upright in standing                            Cognition Arousal/Alertness: Awake/alert Behavior During Therapy: WFL for tasks assessed/performed Overall Cognitive Status: History of cognitive impairments - at baseline                                 General Comments: Pt is AOx1 at time of PT treatment. Pt believes he is reporting for work during session.      Exercises Total Joint Exercises Ankle Circles/Pumps: AROM;Both;Supine;15 reps Quad Sets: Strengthening;Both;Supine;15 reps Gluteal Sets: Strengthening;Both;Supine;15 reps Towel Squeeze: Strengthening;Both;Supine;15 reps Short Arc Quad: Strengthening;Left;Supine;15 reps Heel Slides: Strengthening;Left;15 reps;Supine Hip ABduction/ADduction: Strengthening;Left;Supine;15 reps Straight Leg Raises: Strengthening;Left;Supine;15 reps    General Comments        Pertinent Vitals/Pain Pain Assessment: Faces Faces Pain Scale: Hurts even more Pain Location: No pain reported but  grimacing during transfers Pain Intervention(s): Monitored during session    Home Living                      Prior Function            PT Goals (current goals can now be found in the care plan section) Acute Rehab PT Goals Patient Stated Goal: Improve strength, function, and safety PT Goal Formulation: With family Time For Goal Achievement: 09/15/16 Potential to Achieve Goals: Fair Progress towards PT goals:  Progressing toward goals    Frequency    BID      PT Plan Current plan remains appropriate    Co-evaluation              AM-PAC PT "6 Clicks" Daily Activity  Outcome Measure  Difficulty turning over in bed (including adjusting bedclothes, sheets and blankets)?: Unable Difficulty moving from lying on back to sitting on the side of the bed? : Unable Difficulty sitting down on and standing up from a chair with arms (e.g., wheelchair, bedside commode, etc,.)?: Unable Help needed moving to and from a bed to chair (including a wheelchair)?: A Lot Help needed walking in hospital room?: Total Help needed climbing 3-5 steps with a railing? : Total 6 Click Score: 7    End of Session Equipment Utilized During Treatment: Gait belt Activity Tolerance: Patient tolerated treatment well Patient left: with call bell/phone within reach;with SCD's reapplied;in bed;with bed alarm set   PT Visit Diagnosis: Unsteadiness on feet (R26.81);Muscle weakness (generalized) (M62.81);History of falling (Z91.81);Repeated falls (R29.6);Pain Pain - Right/Left: Left Pain - part of body: Leg     Time: 3299-2426 PT Time Calculation (min) (ACUTE ONLY): 26 min  Charges:  $Therapeutic Exercise: 8-22 mins $Therapeutic Activity: 8-22 mins                    G Codes:       Lyndel Safe Daxson Reffett PT, DPT     Tashawn Greff 09/01/2016, 4:57 PM

## 2016-09-01 NOTE — Progress Notes (Signed)
   Subjective: 2 Days Post-Op Procedure(s) (LRB): OPEN REDUCTION INTERNAL FIXATION (ORIF) DISTAL FEMUR FRACTURE (Left) Patient reports pain as mild.   Patient is well, and has had no acute complaints or problems Denies any CP, SOB, ABD pain. We will start therapy today.    Objective: Vital signs in last 24 hours: Temp:  [98.5 F (36.9 C)] 98.5 F (36.9 C) (08/19 1602) Pulse Rate:  [48-73] 48 (08/19 2026) Resp:  [18] 18 (08/19 1602) BP: (96-117)/(58) 117/58 (08/19 2026) SpO2:  [95 %-98 %] 95 % (08/19 2026)  Intake/Output from previous day: 08/19 0701 - 08/20 0700 In: 840 [P.O.:840] Out: 650 [Urine:650] Intake/Output this shift: Total I/O In: -  Out: 250 [Urine:250]   Recent Labs  08/30/16 0041 08/30/16 0432 09/01/16 0317  HGB 13.6 12.9* 11.2*    Recent Labs  08/30/16 0041 08/30/16 0432  WBC 7.8 10.2  RBC 4.15* 3.85*  HCT 39.0* 37.2*  PLT 265 253    Recent Labs  08/30/16 0432 08/31/16 0329  NA 145 141  K 3.9 4.2  CL 108 109  CO2 31 28  BUN 16 15  CREATININE 0.87 0.67  GLUCOSE 132* 132*  CALCIUM 9.1 8.5*   No results for input(s): LABPT, INR in the last 72 hours.  EXAM General - Patient is Alert, Appropriate and Oriented Extremity - Neurovascular intact Sensation intact distally Intact pulses distally Dorsiflexion/Plantar flexion intact No cellulitis present Compartment soft Dressing - dressing C/D/I and moderate drainage, dressing changed Motor Function - intact, moving foot and toes well on exam.   Past Medical History:  Diagnosis Date  . Cancer ([place])    basal cell on eyelid  . CHF (congestive heart failure) ([place])   . Cirrhosis ([place])   . Hypertension   . Normal pressure hydrocephalus   . Sleep apnea     Assessment/Plan:   2 Days Post-Op Procedure(s) (LRB): OPEN REDUCTION INTERNAL FIXATION (ORIF) DISTAL FEMUR FRACTURE (Left) Active Problems:   Hip fracture ([facility])   Femur fracture, left ([facility])  Estimated body mass index is 22.15  kg/m as calculated from the following:   Height as of this encounter: 5\' 10"  (1.778 m).   Weight as of this encounter: 70 kg (154 lb 6.4 oz). Advance diet Up with therapy  Labs stable Pain well controlled Plan on discharge to SNF tomorrow Follow up with [facility] 09/12/16 for staple removal  DVT Prophylaxis - Lovenox, Foot Pumps and TED hose Weight-Bearing as tolerated to left leg   T. Rachelle Hora, PA-C [facility] 09/01/2016, 8:13 AM

## 2016-09-01 NOTE — Progress Notes (Signed)
[place] [facility] [place] NAME: Stephen Roth    MR#:  [id number]  DATE OF BIRTH:  Jun 29, 1925  SUBJECTIVE:  CHIEF COMPLAINT:   Chief Complaint  Patient presents with  . Fall   Patient unable to provide any review of system but currently comfortable and denies any symptoms awaiting PT evaluation  REVIEW OF SYSTEMS:    Review of Systems  Unable to perform ROS: Dementia    DRUG ALLERGIES:   Allergies  Allergen Reactions  . Cephalexin Other (See Comments)    Confusion, hives, hallucinatons  . Clindamycin/Lincomycin Other (See Comments)    Confusion, hives, hallucinations  . Morphine And Related Other (See Comments)    VITALS:  Blood pressure 104/67, pulse 65, temperature 97.9 F (36.6 C), temperature source Oral, resp. rate 18, height 5\' 10"  (1.778 m), weight 154 lb 6.4 oz (70 kg), SpO2 99 %.  PHYSICAL EXAMINATION:   Physical Exam  GENERAL:  83 y.o.-year-old patient lying in the bed with no acute distress.  EYES: Pupils equal, round, reactive to light and accommodation. No scleral icterus. Extraocular muscles intact.  HEENT: Head atraumatic, normocephalic. Oropharynx and nasopharynx clear.  NECK:  Supple, no jugular venous distention. No thyroid enlargement, no tenderness.  LUNGS: Normal breath sounds bilaterally, no wheezing, rales, rhonchi. No use of accessory muscles of respiration.  CARDIOVASCULAR: S1, S2 normal. No murmurs, rubs, or gallops.  ABDOMEN: Soft, nontender, nondistended. Bowel sounds present. No organomegaly or mass.  EXTREMITIES: Left thigh dressing. Old blood under dressing. NEUROLOGIC: Cranial nerves II through XII are intact. No focal Motor or sensory deficits b/l.   PSYCHIATRIC: The patient is alert and awake. Pleasantly confused SKIN: No obvious rash, lesion, or ulcer.   LABORATORY PANEL:   CBC  Recent Labs Lab 08/30/16 0432 09/01/16 0317  WBC 10.2  --   HGB 12.9* 11.2*  HCT 37.2*  --   PLT 253  --     ------------------------------------------------------------------------------------------------------------------ Chemistries   Recent Labs Lab 08/31/16 0329  NA 141  K 4.2  CL 109  CO2 28  GLUCOSE 132*  BUN 15  CREATININE 0.67  CALCIUM 8.5*  AST 22  ALT 13*  ALKPHOS 58  BILITOT 0.8   ------------------------------------------------------------------------------------------------------------------  Cardiac Enzymes No results for input(s): TROPONINI in the last 168 hours. ------------------------------------------------------------------------------------------------------------------  RADIOLOGY:  Dg Femur Min 2 Views Left  Result Date: 08/30/2016 CLINICAL DATA:  Left femur fracture EXAM: DG C-ARM 61-120 MIN-NO REPORT; LEFT FEMUR 2 VIEWS FLUOROSCOPY TIME:  Fluoroscopy Time:  4 minutes COMPARISON:  None. FINDINGS: Oblique distal femoral diaphysis fracture transfixed with a lateral sideplate and multiple interlocking screws. Superior most interlocking screw is fractured. Fracture is in near anatomic alignment. Postsurgical changes in the surrounding soft tissues. There is evidence of prior left hip arthroplasty and total knee arthroplasties. IMPRESSION: 1. Interval distal femoral fracture ORIF. Electronically Signed   By: Kathreen Devoid   On: 08/30/2016 16:23     ASSESSMENT AND PLAN:   * OPEN REDUCTION INTERNAL FIXATION (ORIF) DISTAL FEMUR FRACTURE (Left) due to accidental fall Postop day 2.  Seen by PT Social worker recommends discharge tomorrow  * Chronic systolic CHF with ejection fraction of 25%. Stable. No signs of fluid overload.  Not on beta blockers or ACE inhibitor's are are due to low normal blood pressure at home. Lasix when necessary   * Cirrhosis of the liver. Stable  * Dementia. Watch for inpatient delirium. Appears to be stable  * DVT prophylaxis  with Lovenox  All the records are reviewed and case discussed with Care Management/Social Worker Management  plans discussed with the patient, family and they are in agreement.  CODE STATUS: DO NOT RESUSCITATE  DVT Prophylaxis: SCDs  TOTAL TIME TAKING CARE OF THIS PATIENT: 25 minutes.   POSSIBLE D/C IN 1-2 DAYS, DEPENDING ON CLINICAL CONDITION.  Dustin Flock M.D on 09/01/2016 at 3:31 PM  Between 7am to 6pm - Pager - [phone]  After 6pm go to [email or web address] - password [email or web address] [facility]  [facility] [place] [facility]  Office  [phone]  CC: Primary care physician; Adin Hector, MD  Note: This dictation was prepared with Dragon dictation along with smaller phrase technology. Any transcriptional errors that result from this process are unintentional.

## 2016-09-01 NOTE — Progress Notes (Addendum)
Clinical Social Worker (CSW) met with patient and his daughter [name] to present bed offers. Daughter requested [facility]. Per [facility] admissions coordinator at [facility] they have no male beds. Daughter requested for CSW to send referral to [facility] memory care unit. Per [facility] admissions coordinator at [facility] memory care they have no beds. Daughter is aware of above and then accepted bed offer from [facility] for short term rehab. Per daughter the goal is to get patient into [facility] for short term rehab under [employer] and then transition to [facility] SNF long term care private pay. [facility] liaison is aware of above and will start [employer] authorization. PT is pending. [employer] authorization can't be started until PT evaluates patient. CSW spoke to PT staff and made her aware of above. CSW will continue to follow and assist as needed.   CSW sent PT note to [facility] via HUB.   [employer], LCSW [phone]

## 2016-09-02 ENCOUNTER — Inpatient Hospital Stay: Payer: Medicare HMO

## 2016-09-02 MED ORDER — LACTULOSE 10 GM/15ML PO SOLN: 30.0000 g | Freq: Two times a day (BID) | ORAL | Status: DC

## 2016-09-02 MED ORDER — HYDROCODONE-ACETAMINOPHEN 5-325 MG PO TABS: 1.0000 | ORAL_TABLET | ORAL | 0 refills | Status: DC | PRN

## 2016-09-02 MED ORDER — DOCUSATE SODIUM 100 MG PO CAPS
200.0000 mg | ORAL_CAPSULE | Freq: Two times a day (BID) | ORAL | Status: DC
  Administered 2016-09-02 – 2016-09-03 (×3): 200 mg via ORAL
  Filled 2016-09-02 (×3): qty 2

## 2016-09-02 MED ORDER — ENOXAPARIN SODIUM 30 MG/0.3ML ~~LOC~~ SOLN: 30.0000 mg | SUBCUTANEOUS | 0 refills | Status: DC

## 2016-09-02 MED ORDER — FLEET ENEMA 7-19 GM/118ML RE ENEM: 1.0000 | ENEMA | Freq: Every day | RECTAL | Status: DC | PRN

## 2016-09-02 MED ORDER — BISACODYL 10 MG RE SUPP
10.0000 mg | Freq: Every day | RECTAL | Status: DC | PRN
  Administered 2016-09-02: 10 mg via RECTAL

## 2016-09-02 MED ORDER — POLYETHYLENE GLYCOL 3350 17 G PO PACK
17.0000 g | PACK | Freq: Every day | ORAL | Status: DC
  Administered 2016-09-02: 17 g via ORAL
  Filled 2016-09-02: qty 1

## 2016-09-02 NOTE — Progress Notes (Signed)
Per [facility] liaison [facility] authorization has been received. Patient is medically stable for D/C to [facility] today. Per [facility] John patient can come today to room 805. RN will call report at [phone] and arrange EMS for transport. Clinical Education officer, museum (CSW) sent D/C orders to [facility] via HUB. Patient's daughter Stephen Roth is at bedside and aware of above. Please reconsult if future social work needs arise. CSW signing off.   [employer], LCSW [phone]

## 2016-09-02 NOTE — Progress Notes (Signed)
Physical Therapy Treatment Patient Details Name: Stephen Roth. MRN: [id number] DOB: 03/21/1925 Today's Date: 09/02/2016    History of Present Illness Pt sufferred a fall with a distal L femur fracture. He is s/p ORIF and is POD#2 at time of PT evaluation. Order for PT was not received until 09/01/16 11:24. PMH includes dementia, CHF, HTN, L hip fracture, and pacemaker.     PT Comments    Pt in bed, upon arrival daughter and nursing stated they were concerned about hip dislocation.  Observed LE.  Pt does have a leg length difference and some internal rotation noted with valgus in LLE.  Daughter stating she feels it is different from yesterday.  Discussed with Galen Brattton PT and Jason Hupbridge PT who both came in the room to check.  Decision made to limit mobility this am and will address with ortho MD.  Daughter said at end of session that she remembered that he did have a lift shoe made for LLE due to it being shorter but she remains concerned over general LE appearance.  Participated in exercises as described below.   Follow Up Recommendations  SNF     Equipment Recommendations  None recommended by PT    Recommendations for Other Services       Precautions / Restrictions Precautions Precautions: Fall Restrictions Weight Bearing Restrictions: Yes LLE Weight Bearing: Weight bearing as tolerated    Mobility  Bed Mobility                  Transfers                    Ambulation/Gait                 Stairs            Wheelchair Mobility    Modified Rankin (Stroke Patients Only)       Balance                                            Cognition Arousal/Alertness: Awake/alert Behavior During Therapy: WFL for tasks assessed/performed Overall Cognitive Status: History of cognitive impairments - at baseline                                        Exercises Other Exercises Other Exercises: RLE  A/AAROM for heel slides, ankle pumps, ab/add SAQ 2 x 10.      General Comments        Pertinent Vitals/Pain Pain Assessment: Faces Faces Pain Scale: Hurts even more Pain Location: no grimmacing noted, poor descriptions Pain Intervention(s): Limited activity within patient's tolerance;Monitored during session    Home Living                      Prior Function            PT Goals (current goals can now be found in the care plan section) Progress towards PT goals: Progressing toward goals    Frequency    BID      PT Plan Current plan remains appropriate    Co-evaluation              AM-PAC PT "6 Clicks" Daily Activity  Outcome Measure  Difficulty turning over in  bed (including adjusting bedclothes, sheets and blankets)?: Unable Difficulty moving from lying on back to sitting on the side of the bed? : Unable Difficulty sitting down on and standing up from a chair with arms (e.g., wheelchair, bedside commode, etc,.)?: Unable Help needed moving to and from a bed to chair (including a wheelchair)?: A Lot Help needed walking in hospital room?: Total Help needed climbing 3-5 steps with a railing? : Total 6 Click Score: 7    End of Session   Activity Tolerance: Other (comment) Patient left: in bed;with bed alarm set;with call bell/phone within reach;with family/visitor present;with SCD's reapplied Nurse Communication: Other (comment) Pain - Right/Left: Left Pain - part of body: Leg     Time: 0932-6712 PT Time Calculation (min) (ACUTE ONLY): 25 min  Charges:  $Therapeutic Exercise: 23-37 mins                    G Codes:       Chesley Noon, PTA 09/02/16, 11:18 AM

## 2016-09-02 NOTE — Progress Notes (Signed)
Erline Levine from [facility] called with x-ray rept. Dr. Rudene Christians called back and made aware of xray results. Per his order, follow through with discharge to [facility]. Per his order, this xray appears different from previous films due to positioning during x-ray. Pt not c/o of increased pain of L leg today. Rept to [facility] on nights. Daughter made aware of plan of care.

## 2016-09-02 NOTE — Progress Notes (Signed)
Rept from Dr. Rudene Christians. MD has reviewed xray results. Per his order, proceed with discharge. Will rept to [facility] on nights.

## 2016-09-02 NOTE — Progress Notes (Signed)
Dr. Rudene Christians in to see and examine patient. Order received for stat X-ray of L thigh/hip area, Will continue to monitor.

## 2016-09-02 NOTE — Progress Notes (Signed)
[place] at [place] NAME: Stephen Roth    MR#:  [id number]  DATE OF BIRTH:  1925-06-07  SUBJECTIVE:  CHIEF COMPLAINT:   Chief Complaint  Patient presents with  . Fall   Patient has baseline dementia and was confused last night currently confusion improved  REVIEW OF SYSTEMS:    Review of Systems  Unable to perform ROS: Dementia    DRUG ALLERGIES:   Allergies  Allergen Reactions  . Cephalexin Other (See Comments)    Confusion, hives, hallucinatons  . Clindamycin/Lincomycin Other (See Comments)    Confusion, hives, hallucinations  . Morphine And Related Other (See Comments)    VITALS:  Blood pressure (!) 101/46, pulse 71, temperature 98.5 F (36.9 C), temperature source Oral, resp. rate 16, height 5\' 10"  (1.778 m), weight 154 lb 6.4 oz (70 kg), SpO2 98 %.  PHYSICAL EXAMINATION:   Physical Exam  GENERAL:  83 y.o.-year-old patient lying in the bed with no acute distress.  EYES: Pupils equal, round, reactive to light and accommodation. No scleral icterus. Extraocular muscles intact.  HEENT: Head atraumatic, normocephalic. Oropharynx and nasopharynx clear.  NECK:  Supple, no jugular venous distention. No thyroid enlargement, no tenderness.  LUNGS: Normal breath sounds bilaterally, no wheezing, rales, rhonchi. No use of accessory muscles of respiration.  CARDIOVASCULAR: S1, S2 normal. No murmurs, rubs, or gallops.  ABDOMEN: Soft, nontender, nondistended. Bowel sounds present. No organomegaly or mass.  EXTREMITIES: Left thigh dressing. Old blood under dressing. NEUROLOGIC: Cranial nerves II through XII are intact. No focal Motor or sensory deficits b/l.   PSYCHIATRIC: The patient is alert and awake. Pleasantly confused SKIN: No obvious rash, lesion, or ulcer.   LABORATORY PANEL:   CBC  Recent Labs Lab 08/30/16 0432 09/01/16 0317  WBC 10.2  --   HGB 12.9* 11.2*  HCT 37.2*  --   PLT 253  --     ------------------------------------------------------------------------------------------------------------------ Chemistries   Recent Labs Lab 08/31/16 0329  NA 141  K 4.2  CL 109  CO2 28  GLUCOSE 132*  BUN 15  CREATININE 0.67  CALCIUM 8.5*  AST 22  ALT 13*  ALKPHOS 58  BILITOT 0.8   ------------------------------------------------------------------------------------------------------------------  Cardiac Enzymes No results for input(s): TROPONINI in the last 168 hours. ------------------------------------------------------------------------------------------------------------------  RADIOLOGY:  No results found.   ASSESSMENT AND PLAN:   * OPEN REDUCTION INTERNAL FIXATION (ORIF) DISTAL FEMUR FRACTURE (Left) due to accidental fall Postop day 3  Seen by PT No authorization from insurance needs that prior to discharge  * Chronic systolic CHF with ejection fraction of 25%. Stable. No signs of fluid overload.  Not on beta blockers or ACE inhibitor's are are due to low normal blood pressure at home. Lasix when necessary   * Cirrhosis of the liver. Stable  * Dementia. Watch for inpatient delirium. Appears to be stable waxes and wanes during night Daughter does not want changes in his medications  * DVT prophylaxis with Lovenox  All the records are reviewed and case discussed with Care Management/Social Worker Management plans discussed with the patient, family and they are in agreement.  CODE STATUS: DO NOT RESUSCITATE  DVT Prophylaxis: SCDs  TOTAL TIME TAKING CARE OF THIS PATIENT: 25 minutes.   POSSIBLE D/C IN 1-2 DAYS, DEPENDING ON CLINICAL CONDITION.  Stephen Roth M.D on 09/02/2016 at 2:31 PM  Between 7am to 6pm - Pager - [phone]  After 6pm go to [email or web address] - password [email or web address] [place] [facility]  Office  [phone]  CC: Primary care physician; Stephen Hector, MD  Note: This dictation was prepared with Dragon dictation  along with smaller phrase technology. Any transcriptional errors that result from this process are unintentional.

## 2016-09-02 NOTE — Progress Notes (Signed)
Daughter is refusing to let Stephen Roth be discharged this late in the evening Dr Roland Rack notified of her decision and Dr Posey Pronto was paged. Dr Roland Rack said it would be ok to let him stay until Wednesday

## 2016-09-02 NOTE — Progress Notes (Signed)
   Subjective: 3 Days Post-Op Procedure(s) (LRB): OPEN REDUCTION INTERNAL FIXATION (ORIF) DISTAL FEMUR FRACTURE (Left) Patient reports pain as mild.   Patient is well, and has had no acute complaints or problems Denies any CP, SOB, ABD pain. We will continue therapy today.    Objective: Vital signs in last 24 hours: Temp:  [97.9 F (36.6 C)-98.7 F (37.1 C)] 98.7 F (37.1 C) (08/20 1937) Pulse Rate:  [65-73] 73 (08/20 1937) Resp:  [18-19] 19 (08/20 1937) BP: (104-108)/(58-67) 108/58 (08/20 1937) SpO2:  [99 %-100 %] 100 % (08/20 1937)  Intake/Output from previous day: 08/20 0701 - 08/21 0700 In: 720 [P.O.:720] Out: 870 [Urine:870] Intake/Output this shift: No intake/output data recorded.   Recent Labs  09/01/16 0317  HGB 11.2*   No results for input(s): WBC, RBC, HCT, PLT in the last 72 hours.  Recent Labs  08/31/16 0329  NA 141  K 4.2  CL 109  CO2 28  BUN 15  CREATININE 0.67  GLUCOSE 132*  CALCIUM 8.5*   No results for input(s): LABPT, INR in the last 72 hours.  EXAM General - Patient is Alert, Appropriate and Oriented Extremity - Neurovascular intact Sensation intact distally Intact pulses distally Dorsiflexion/Plantar flexion intact No cellulitis present Compartment soft Dressing - dressing C/D/I and scant drainage Motor Function - intact, moving foot and toes well on exam.   Past Medical History:  Diagnosis Date  . Cancer ([place])    basal cell on eyelid  . CHF (congestive heart failure) ([place])   . Cirrhosis ([place])   . Hypertension   . Normal pressure hydrocephalus   . Sleep apnea     Assessment/Plan:   3 Days Post-Op Procedure(s) (LRB): OPEN REDUCTION INTERNAL FIXATION (ORIF) DISTAL FEMUR FRACTURE (Left) Active Problems:   Hip fracture ([facility])   Femur fracture, left ([facility])  Estimated body mass index is 22.15 kg/m as calculated from the following:   Height as of this encounter: 5\' 10"  (1.778 m).   Weight as of this encounter: 70 kg (154 lb  6.4 oz). Advance diet Up with therapy  Hgb stable Pain well controlled Plan on discharge to SNF today, [facility] Follow up with [facility] 09/12/16 for staple removal and Xrays  DVT Prophylaxis - Lovenox, Foot Pumps and TED hose Weight-Bearing as tolerated to left leg   T. Rachelle Hora, PA-C [place] 09/02/2016, 7:21 AM

## 2016-09-02 NOTE — Discharge Instructions (Signed)
Diet: As you were doing prior to hospitalization  ° °Shower:  May shower but keep the wounds dry, use an occlusive plastic wrap, NO SOAKING IN TUB.  If the bandage gets wet, change with a clean dry gauze. ° °Dressing:  You may change your dressing as needed. Change the dressing with sterile gauze dressing.   ° °Activity:  Increase activity slowly as tolerated, but follow the weight bearing instructions below.  ° °Weight Bearing:   Weight bearing as tolerated to left lower extremity ° °To prevent constipation: you may use a stool softener such as - ° °Colace (over the counter) 100 mg by mouth twice a day  °Drink plenty of fluids (prune juice may be helpful) and high fiber foods °Miralax (over the counter) for constipation as needed.   ° °Itching:  If you experience itching with your medications, try taking only a single pain pill, or even half a pain pill at a time.  You may take up to 10 pain pills per day, and you can also use benadryl over the counter for itching or also to help with sleep.  ° °Precautions:  If you experience chest pain or shortness of breath - call 911 immediately for transfer to the hospital emergency department!! ° °If you develop a fever greater that 101 F, purulent drainage from wound, increased redness or drainage from wound, or calf pain-Call [facility]                                              °Follow- Up Appointment:  Please call for an appointment to be seen in 2 weeks at [facility] ° °

## 2016-09-02 NOTE — Discharge Summary (Addendum)
[place] [facility] 83 y.o., DOB 12/31/1925, MRN [id number]. Admission date: 08/30/2016 Discharge Date 09/03/2016 Primary MD Adin Hector, MD Admitting Physician Saundra Shelling, MD  Admission Diagnosis  Fall [W19.XXXA] Other closed fracture of left femur, unspecified portion of femur, initial encounter ([place]) [S72.8X2A] Femur fracture, left ([place]) [S72.92XA]  Discharge Diagnosis   Active Problems:   Femur fracture, left ([place])   Chronic systolic chf   Crirrhosis of liver   dementia  Hospital Course   Stephen Roth  is a 83 y.o. male with a known history of basal cell cancer of the eyelid, congestive heart failure, cirrhosis of liver, hypertension, sleep apnea fell down in the facility. Patient lost balance and fell on the left side of the hip and lower extremity. He has pain in the left thigh. He was evaluated in the emergency room with x-rays of the hip and left lower extremity which showed distal femur fracture on the left side. The pain is aching in nature 7 out of 10 on a scale of 1-10. No complaints of any chest pain, shortness of breath. No history of head injury. No loss of consciousness. Hospitalist service was consulted for further care. Pt was seen by ortho and had surgical repair . Pt has some post op confusion other wise doing well. Mild anemia but stable. Cleared for discharge by Dr. Rudene Christians and team.  Discharge to SNF today  lovenox duration 2 wks  Consults  orthopedics  Significant Tests:  See full reports for all details    Dg C-arm 61-120 Min-no Report  Result Date: 08/30/2016 CLINICAL DATA:  Left femur fracture EXAM: DG C-ARM 61-120 MIN-NO REPORT; LEFT FEMUR 2 VIEWS FLUOROSCOPY TIME:  Fluoroscopy Time:  4 minutes COMPARISON:  None. FINDINGS: Oblique distal femoral diaphysis fracture transfixed with a lateral sideplate and multiple interlocking screws. Superior most interlocking screw is fractured. Fracture is in near  anatomic alignment. Postsurgical changes in the surrounding soft tissues. There is evidence of prior left hip arthroplasty and total knee arthroplasties. IMPRESSION: 1. Interval distal femoral fracture ORIF. Electronically Signed   By: Kathreen Devoid   On: 08/30/2016 16:23   Dg Hip Unilat W Or W/o Pelvis 2-3 Views Left  Result Date: 09/02/2016 CLINICAL DATA:  Postop.  Left pain. EXAM: DG HIP (WITH OR WITHOUT PELVIS) 2-3V LEFT COMPARISON:  August 30, 2016 FINDINGS: The oblique distal femoral diaphysis fracture is again identified. Alignment has significantly worsened in the interval. There is a sideplate affixed with multiple screws. The superior most screw is fractured which is a stable finding. The other screws are intact. The patient is status post knee replacement. Visualized knee replacement hardware is unremarkable. The patient is status post left hip replacement. This hardware is in good position. Dystrophic calcifications around left hip have a chronic appearance. No other acute abnormalities identified. IMPRESSION: 1. The distal femoral fracture has become more displaced in the interval. The superior most screw associated with the sideplate remains fractured. These results will be called to the ordering clinician or representative by the Radiologist Assistant, and communication documented in the PACS or zVision Dashboard. Electronically Signed   By: Dorise Bullion III M.D   On: 09/02/2016 18:56   Dg Femur Min 2 Views Left  Result Date: 08/30/2016 CLINICAL DATA:  Left femur fracture EXAM: DG C-ARM 61-120 MIN-NO REPORT; LEFT FEMUR 2 VIEWS FLUOROSCOPY TIME:  Fluoroscopy Time:  4 minutes COMPARISON:  None. FINDINGS: Oblique distal femoral diaphysis  fracture transfixed with a lateral sideplate and multiple interlocking screws. Superior most interlocking screw is fractured. Fracture is in near anatomic alignment. Postsurgical changes in the surrounding soft tissues. There is evidence of prior left hip  arthroplasty and total knee arthroplasties. IMPRESSION: 1. Interval distal femoral fracture ORIF. Electronically Signed   By: Kathreen Devoid   On: 08/30/2016 16:23   Dg Femur Min 2 Views Left  Result Date: 08/30/2016 CLINICAL DATA:  [place] from bed.  History of LEFT hip arthroplasty. EXAM: LEFT FEMUR 2 VIEWS COMPARISON:  None. FINDINGS: Acute distal femur fracture and with impaction, fracture extends to the femoral component of total knee arthroplasty. Posterior angulation distal bony fragments. Status post LEFT hip hemiarthroplasty, femoral head intact. No dislocation. Heterotopic ossification lesser trochanter. Osteopenia without destructive bony lesions. Phleboliths project in LEFT pelvis. Moderate vascular calcifications. IMPRESSION: Acute displaced distal femur fracture, fracture extends to femoral component of total knee arthroplasty. No dislocation. Electronically Signed   By: Elon Alas M.D.   On: 08/30/2016 01:40    Today   Subjective:   Stephen Roth  Pt feels ok no complaints  Objective:   Blood pressure (!) 110/57, pulse 63, temperature 98.5 F (36.9 C), temperature source Oral, resp. rate 16, height 5\' 10"  (1.778 m), weight 70 kg (154 lb 6.4 oz), SpO2 97 %.  .  Intake/Output Summary (Last 24 hours) at 09/03/16 0951 Last data filed at 09/02/16 2118  Gross per 24 hour  Intake              600 ml  Output              450 ml  Net              150 ml    Exam VITAL SIGNS: Blood pressure (!) 110/57, pulse 63, temperature 98.5 F (36.9 C), temperature source Oral, resp. rate 16, height 5\' 10"  (1.778 m), weight 70 kg (154 lb 6.4 oz), SpO2 97 %.  GENERAL:  83 y.o.-year-old patient lying in the bed with no acute distress.  EYES: Pupils equal, round, reactive to light and accommodation. No scleral icterus. Extraocular muscles intact.  HEENT: Head atraumatic, normocephalic. Oropharynx and nasopharynx clear.  NECK:  Supple, no jugular venous distention. No thyroid enlargement, no  tenderness.  LUNGS: Normal breath sounds bilaterally, no wheezing, rales,rhonchi or crepitation. No use of accessory muscles of respiration.  CARDIOVASCULAR: S1, S2 normal. No murmurs, rubs, or gallops.  ABDOMEN: Soft, nontender, nondistended. Bowel sounds present. No organomegaly or mass.  EXTREMITIES: No pedal edema, cyanosis, or clubbing.  NEUROLOGIC: Cranial nerves II through XII are intact. Muscle strength 5/5 in all extremities. Sensation intact. Gait not checked.  PSYCHIATRIC: The patient is alert and oriented x 3.  SKIN: No obvious rash, lesion, or ulcer.   Data Review     CBC w Diff:  Lab Results  Component Value Date   WBC 10.2 08/30/2016   HGB 11.2 (L) 09/01/2016   HCT 37.2 (L) 08/30/2016   PLT 253 08/30/2016   LYMPHOPCT 19 08/30/2016   MONOPCT 9 08/30/2016   EOSPCT 5 08/30/2016   BASOPCT 1 08/30/2016   CMP: Lab Results  Component Value Date   NA 141 08/31/2016   K 4.2 08/31/2016   CL 109 08/31/2016   CO2 28 08/31/2016   BUN 15 08/31/2016   CREATININE 0.67 08/31/2016   PROT 5.0 (L) 08/31/2016   ALBUMIN 2.7 (L) 08/31/2016   BILITOT 0.8 08/31/2016   ALKPHOS 58 08/31/2016  AST 22 08/31/2016   ALT 13 (L) 08/31/2016  .  Micro Results Recent Results (from the past 240 hour(s))  Surgical PCR screen     Status: None   Collection Time: 08/30/16  3:45 AM  Result Value Ref Range Status   MRSA, PCR NEGATIVE NEGATIVE Final   Staphylococcus aureus NEGATIVE NEGATIVE Final    Comment:        The Xpert SA Assay (FDA approved for NASAL specimens in patients over 51 years of age), is one component of a comprehensive surveillance program.  Test performance has been validated by [facility] for patients greater than or equal to 12 year old. It is not intended to diagnose infection nor to guide or monitor treatment.         Code Status Orders        Start     Ordered   08/30/16 4967  Do not attempt resuscitation (DNR)  Continuous    Question Answer  Comment  In the event of cardiac or respiratory ARREST Do not call a "code blue"   In the event of cardiac or respiratory ARREST Do not perform Intubation, CPR, defibrillation or ACLS   In the event of cardiac or respiratory ARREST Use medication by any route, position, wound care, and other measures to relive pain and suffering. May use oxygen, suction and manual treatment of airway obstruction as needed for comfort.      08/30/16 0336    Code Status History    Date Active Date Inactive Code Status Order ID Comments User Context   01/20/2016  7:15 AM 01/23/2016  8:03 PM Full Code [id number]  Saundra Shelling, MD Inpatient   08/10/2015  9:17 PM 08/11/2015  3:39 PM Full Code [id number]  Henreitta Leber, MD Inpatient    Advance Directive Documentation     Most Recent Value  Type of Advance Directive  Healthcare Power of [place], Out of facility DNR (pink MOST or yellow form)  Pre-existing out of facility DNR order (yellow form or pink MOST form)  Yellow form placed in chart (order not valid for inpatient use), Physician notified to receive inpatient order  "MOST" Form in Place?  -           Contact information for follow-up providers    Hessie Knows, MD Follow up.   Specialty:  Orthopedic Surgery Contact information: [place] [address] [phone]            Contact information for after-discharge care    Destination    [facility] Follow up.   Specialty:  [place] information: [address] [place] [phone]                  Discharge Medications   Allergies as of 09/03/2016      Reactions   Cephalexin Other (See Comments)   Confusion, hives, hallucinatons   Clindamycin/lincomycin Other (See Comments)   Confusion, hives, hallucinations   Morphine And Related Other (See Comments)      Medication List    TAKE these medications    acetaminophen 325 MG tablet Commonly known as:  TYLENOL Take by mouth every 6 (six) hours as needed.   aspirin EC 81 MG tablet Take 81 mg by mouth daily.   bisacodyl 10 MG suppository Commonly known as:  DULCOLAX Place 1 suppository (10 mg total) rectally daily as needed for moderate constipation.   dutasteride  0.5 MG capsule Commonly known as:  AVODART Take 0.5 mg by mouth daily.   enoxaparin 30 MG/0.3ML injection Commonly known as:  LOVENOX Inject 0.3 mLs (30 mg total) into the skin daily.   HYDROcodone-acetaminophen 5-325 MG tablet Commonly known as:  NORCO/VICODIN Take 1 tablet by mouth every 4 (four) hours as needed for moderate pain (breakthrough pain).   lamoTRIgine 25 MG tablet Commonly known as:  LAMICTAL Take 2 tablets (50 mg total) by mouth at bedtime. What changed:  how much to take   LUTEIN 20 PO Take 1 capsule by mouth every evening.   senna-docusate 8.6-50 MG tablet Commonly known as:  Senokot-S Take 1 tablet by mouth at bedtime as needed for mild constipation.   STOOL SOFTENER PO Take 1 tablet by mouth 2 (two) times daily.   VITAMIN D-3 PO Take 1,000 Units by mouth every evening.            Discharge Care Instructions        Start     Ordered   09/02/16 0000  enoxaparin (LOVENOX) 30 MG/0.3ML injection  Every 24 hours    Question:  Supervising Provider  Answer:  Hessie Knows   09/02/16 0725   09/02/16 0000  HYDROcodone-acetaminophen (NORCO/VICODIN) 5-325 MG tablet  Every 4 hours PRN     09/02/16 1657         Total Time in preparing paper work, data evaluation and todays exam - 35 minutes  Neita Carp M.D on 09/03/2016 at 9:52 AM  [facility]   Office  [phone]

## 2016-09-02 NOTE — Progress Notes (Signed)
Patient's daughter [name] requested [place] representative (CSW) to check with [facility] again. Per [facility] admissions coordinator at [facility] they cannot offer patient a bed. Daughter is aware of above. CSW also provided daughter with private pay rates at [facility] at her request. Per Baron Sane authorization is still pending. Plan is for patient to D/C to [facility]. CSW will continue to follow and assist as needed.   [employer], LCSW [phone]

## 2016-09-02 NOTE — Progress Notes (Signed)
Pt back from xray. Pt cleaned and noted to have had an incontinent brown stool while in radiology. Daughter at bedside and supportive. Pt exhibits no s/sx of distress. Will continue to monitor.

## 2016-09-02 NOTE — NC FL2 (Signed)
[place] LEVEL OF CARE SCREENING TOOL     IDENTIFICATION  Patient Name: Stephen Roth. Birthdate: 09-May-1925 Sex: male Admission Date (Current Location): 08/30/2016  [place] and [place] Number:  Engineering geologist and Address:  [facility], [address] [place], [place] [address]      Provider Number: [id number]  Attending Physician Name and Address:  Dustin Flock, MD  Relative Name and Phone Number:  [name] (daughter/HCPOA) [phone]    Current Level of Care: Hospital Recommended Level of Care: [place] Prior Approval Number:    Date Approved/Denied: 03/27/10 PASRR Number: [id number]  Discharge Plan: SNF    Current Diagnoses: Patient Active Problem List   Diagnosis Date Noted  . Femur fracture, left ([place]) 08/30/2016  . Hip fracture ([place]) 01/20/2016  . GI bleed 08/10/2015    Orientation RESPIRATION BLADDER Height & Weight     Self, Time, Situation, Place  Normal Continent Weight: 154 lb 6.4 oz (70 kg) Height:  5\' 10"  (177.8 cm)  BEHAVIORAL SYMPTOMS/MOOD NEUROLOGICAL BOWEL NUTRITION STATUS      Continent Diet (Heart healthy)  AMBULATORY STATUS COMMUNICATION OF NEEDS Skin   Extensive Assist Verbally Surgical wounds                       Personal Care Assistance Level of Assistance  Bathing, Feeding, Dressing Bathing Assistance: Limited assistance Feeding assistance: Independent Dressing Assistance: Limited assistance     Functional Limitations Info             SPECIAL CARE FACTORS FREQUENCY  PT (By licensed PT)     PT Frequency: Up to 5X per day, 5 days per week              Contractures Contractures Info: Not present    Additional Factors Info  Code Status, Allergies, Psychotropic Code Status Info: DNR Allergies Info: Cephalexin, Clindamycin/lincomycin, Morphine And Related Psychotropic Info: Lamictal         Current Medications (09/02/2016):  This is  the current hospital active medication list Current Facility-Administered Medications  Medication Dose Route Frequency Provider Last Rate Last Dose  . acetaminophen (TYLENOL) tablet 650 mg  650 mg Oral Q6H PRN Hessie Knows, MD   650 mg at 09/02/16 1003   Or  . acetaminophen (TYLENOL) suppository 650 mg  650 mg Rectal Q6H PRN Hessie Knows, MD      . cholecalciferol (VITAMIN D) tablet 2,000 Units  2,000 Units Oral QPM Saundra Shelling, MD   2,000 Units at 09/01/16 1735  . dextrose 5 %-0.9 % sodium chloride infusion   Intravenous Continuous Saundra Shelling, MD 75 mL/hr at 08/30/16 0356    . docusate sodium (COLACE) capsule 200 mg  200 mg Oral BID Dustin Flock, MD   200 mg at 09/02/16 1004  . dutasteride (AVODART) capsule 0.5 mg  0.5 mg Oral Daily Pyreddy, Pavan, MD   0.5 mg at 09/02/16 1008  . enoxaparin (LOVENOX) injection 30 mg  30 mg Subcutaneous Q24H Hessie Knows, MD   30 mg at 09/02/16 0752  . folic acid (FOLVITE) tablet 0.5 mg  500 mcg Oral Daily Pyreddy, Pavan, MD   0.5 mg at 09/02/16 1004  . HYDROcodone-acetaminophen (NORCO/VICODIN) 5-325 MG per tablet 1 tablet  1 tablet Oral Q4H PRN Hessie Knows, MD      . lamoTRIgine (LAMICTAL) tablet 25 mg  25 mg Oral QHS Hillary Bow, MD   25 mg at 09/01/16 2111  .  magnesium oxide (MAG-OX) tablet 400 mg  400 mg Oral QPM Pyreddy, Reatha Harps, MD   400 mg at 09/01/16 1735  . Melatonin TABS 10 mg  10 mg Oral QHS Saundra Shelling, MD   10 mg at 09/01/16 2111  . metoCLOPramide (REGLAN) tablet 5-10 mg  5-10 mg Oral Q8H PRN Hessie Knows, MD       Or  . metoCLOPramide (REGLAN) injection 5-10 mg  5-10 mg Intravenous Q8H PRN Hessie Knows, MD      . ondansetron [facility] 352) tablet 4 mg  4 mg Oral Q6H PRN Pyreddy, Reatha Harps, MD       Or  . ondansetron (ZOFRAN) injection 4 mg  4 mg Intravenous Q6H PRN Pyreddy, Pavan, MD      . polyethylene glycol (MIRALAX / GLYCOLAX) packet 17 g  17 g Oral Daily Dustin Flock, MD   17 g at 09/02/16 1003     Discharge  Medications: Please see discharge summary for a list of discharge medications.  Relevant Imaging Results:  Relevant Lab Results:   Additional Information SS#[id number]  Stephen Roth, Stephen Roth, [place]

## 2016-09-02 NOTE — Progress Notes (Signed)
Received a call from [place], 7a-7p RN for this pt. Beth shared concern regarding pt. discharge order with recent result of femur x-ray. Beth states, "I just want to do what is best for the pt.".  I talked with Dr. Jimmye Norman, radiology and Dr. Rudene Christians regarding x-ray findings and concern that the RN voiced. Dr. Rudene Christians shared clinical findings explaining that the pt. was able to stand today and that another x-ray will be done in 1 1/2 weeks with his follow up. Dr. Rudene Christians reports OK for the pt. to be discharged to nursing home. Shared status with Eustaquio Maize RN as well as Helene Kelp, the RN caring for the pt. this shift.

## 2016-09-02 NOTE — Clinical Social Work Placement (Addendum)
   CLINICAL SOCIAL WORK PLACEMENT  NOTE  Date:  09/02/2016  Patient Details  Name: Stephen Roth. MRN: [id number] Date of Birth: 18-Aug-1925  Clinical Social Work is seeking post-discharge placement for this patient at the [place] level of care (*CSW will initial, date and re-position this form in  chart as items are completed):  Yes   Patient/family provided with [place] Work Department's list of facilities offering this level of care within the geographic area requested by the patient (or if unable, by the patient's family).  Yes   Patient/family informed of their freedom to choose among providers that offer the needed level of care, that participate in Medicare, Medicaid or managed care program needed by the patient, have an available bed and are willing to accept the patient.  Yes   Patient/family informed of [facility]'s ownership interest in [facility] and [facility], as well as of the fact that they are under no obligation to receive care at these facilities.  PASRR submitted to EDS on       PASRR number received on       Existing PASRR number confirmed on 08/30/16     FL2 transmitted to all facilities in geographic area requested by pt/family on 08/30/16     FL2 transmitted to all facilities within larger geographic area on       Patient informed that his/her managed care company has contracts with or will negotiate with certain facilities, including the following:        Yes   Patient/family informed of bed offers received.  Patient chooses bed at  ([facility] )     Physician recommends and patient chooses bed at      Patient to be transferred to  ([facility] ) on 09/03/16  Patient to be transferred to facility by  [facility] )     Patient family notified on 09/03/16 of transfer.  Name of family member notified:   (Patient's daughter [name] is at bedside and aware of D/C today. )     PHYSICIAN Please sign DNR      Additional Comment:    _______________________________________________ Marlee Trentman, Veronia Beets, LCSW 09/02/2016, 5:21 PM

## 2016-09-02 NOTE — Progress Notes (Signed)
Pt to x-ray via hospital bed without incident per MD order.

## 2016-09-02 NOTE — Progress Notes (Signed)
Physical Therapy Treatment Patient Details Name: Stephen Roth. MRN: [id number] DOB: 05/26/1925 Today's Date: 09/02/2016    History of Present Illness Pt sufferred a fall with a distal L femur spiral fracture, s/p ORIF 8/18. PMH includes dementia, CHF, HTN, L hip fracture, and pacemaker.     PT Comments    Pt showed good effort and willingness to participate, but was generally limited secondary to mental status and perseveration with having a bowel movement.  He was able to do some mobility acts with assist but needed heavy assist with standing - was unable to achieve fully upright though we did take a few side shuffle steps along the EOB.  Overall pt continues to be very limited with poor L quad/ankle control (did relatively well with hip exercises on the L).  Pt was able to bear weight through b/l LEs, but lacked confidence and was not safe w/o considerable cuing and assist.   Follow Up Recommendations  SNF     Equipment Recommendations       Recommendations for Other Services       Precautions / Restrictions Precautions Precautions: Fall Restrictions LLE Weight Bearing: Weight bearing as tolerated    Mobility  Bed Mobility Overal bed mobility: Needs Assistance Bed Mobility: Sit to Supine     Supine to sit: Mod assist Sit to supine: Min assist   General bed mobility comments: Pt showed good participation with mobility, needed a lot of cuing  Transfers Overall transfer level: Needs assistance Equipment used: Rolling walker (2 wheeled) Transfers: Sit to/from Stand Sit to Stand: Max assist         General transfer comment: standing X 2 today (from elevated bed second attempt).  He was very weak and struggled to get hips forward.  He was able to take weight on the L and showed good effort but ultimately was very limited with standing tolerance.  Ambulation/Gait             General Gait Details: Pt apparently does not ambulate at baseline, unsafe to attempt  today.   Stairs            Wheelchair Mobility    Modified Rankin (Stroke Patients Only)       Balance   Sitting-balance support: No upper extremity supported Sitting balance-Leahy Scale: Good       Standing balance-Leahy Scale: Zero Standing balance comment: Pt unable to get to fully upright, heavy assist to remain standing with walker                            Cognition Arousal/Alertness: Awake/alert Behavior During Therapy: Anxious;Restless Overall Cognitive Status: History of cognitive impairments - at baseline                                        Exercises General Exercises - Lower Extremity Ankle Circles/Pumps: AAROM;10 reps;AROM Quad Sets: Strengthening;10 reps Gluteal Sets: Strengthening;10 reps Short Arc Quad: PROM;AAROM;10 reps Heel Slides: AROM;Strengthening;10 reps Hip ABduction/ADduction: Strengthening;AROM;10 reps    General Comments        Pertinent Vitals/Pain Pain Assessment:  (unable to rate, only reports pain in L thigh (no hip pain))    Home Living                      Prior Function  PT Goals (current goals can now be found in the care plan section) Progress towards PT goals: Progressing toward goals    Frequency    BID      PT Plan Current plan remains appropriate    Co-evaluation              AM-PAC PT "6 Clicks" Daily Activity  Outcome Measure  Difficulty turning over in bed (including adjusting bedclothes, sheets and blankets)?: Unable Difficulty moving from lying on back to sitting on the side of the bed? : Unable Difficulty sitting down on and standing up from a chair with arms (e.g., wheelchair, bedside commode, etc,.)?: Unable Help needed moving to and from a bed to chair (including a wheelchair)?: Total Help needed walking in hospital room?: Total Help needed climbing 3-5 steps with a railing? : Total 6 Click Score: 6    End of Session Equipment  Utilized During Treatment: Gait belt Activity Tolerance:  (good effort, pt confused) Patient left: with call bell/phone within reach;with bed alarm set Nurse Communication: Mobility status PT Visit Diagnosis: Unsteadiness on feet (R26.81);Muscle weakness (generalized) (M62.81);History of falling (Z91.81);Repeated falls (R29.6);Pain Pain - Right/Left: Left Pain - part of body: Leg     Time: 1224-4975 PT Time Calculation (min) (ACUTE ONLY): 40 min  Charges:  $Therapeutic Exercise: 23-37 mins $Therapeutic Activity: 8-22 mins                    G Codes:       Kreg Shropshire, DPT 09/02/2016, 4:25 PM

## 2016-09-02 NOTE — Progress Notes (Signed)
Rept to Dr. Rudene Christians in [place]. Pt noted to have internal rotation LLE and leg discrepancy with L leg being shorter than R. No new orders at this time. Will continue to monitor.

## 2016-09-03 NOTE — Progress Notes (Signed)
Physical Therapy Treatment Patient Details Name: Stephen Roth. MRN: [id number] DOB: [birth year] Today's Date: 09/03/2016    History of Present Illness Pt sufferred a fall with a distal L femur spiral fracture, s/p ORIF 8/18. PMH includes dementia, CHF, HTN, L hip fracture, and pacemaker.     PT Comments    Pt is pleasantly confused today and agreeable to all exercises in recliner. He follows approximately 70% of simple one step commands and is able to complete all exercises. Conversation via telephone with Dr. Rudene Christians to confirm that pt is still WBAT on LLE. Changes on radiographs from yesterday were felt to be related to positioning during imaging. Pt still requiring maxA+1 for stand pivot transfer from recliner to bed. However improved stability in standing requiring min/modA+1 to remain upright. Pt is discharging at this time to SNF. Pt will benefit from PT services to address deficits in strength, balance, and mobility in order to return to full function at home.    Follow Up Recommendations  SNF     Equipment Recommendations  None recommended by PT    Recommendations for Other Services OT consult     Precautions / Restrictions Precautions Precautions: Fall Restrictions Weight Bearing Restrictions: Yes LLE Weight Bearing: Weight bearing as tolerated (Confirmed WB status with MD via phone 09/03/16)    Mobility  Bed Mobility Overal bed mobility: Needs Assistance Bed Mobility: Sit to Supine     Supine to sit: Mod assist     General bed mobility comments: Pt requires modA+1 for sit to supine mostly for LE management. He impulsively throws himself backwards initially and daughter is on other side of bed to keep pt from striking railing  Transfers Overall transfer level: Needs assistance Equipment used: Rolling walker (2 wheeled) Transfers: Sit to/from Stand Sit to Stand: Max assist         General transfer comment: Pt perform stand pivot transfer from recliner to  bed with therapist. MaxA+1 but demonstrates improved effort with therapist on this date. Once in standing he only requires min/modA+1 to remain upright. MaxA+1 to pivot to bed. Very minimal weight shifting to LLE with transfers. Grimacing noted with transfers. Unable to ambulate at this time  Ambulation/Gait                 Stairs            Wheelchair Mobility    Modified Rankin (Stroke Patients Only)       Balance Overall balance assessment: Needs assistance Sitting-balance support: No upper extremity supported Sitting balance-Leahy Scale: Good     Standing balance support: No upper extremity supported Standing balance-Leahy Scale: Poor Standing balance comment: min/modA+1 to remain standing once upright                            Cognition Arousal/Alertness: Awake/alert Behavior During Therapy: WFL for tasks assessed/performed Overall Cognitive Status: History of cognitive impairments - at baseline                                 General Comments: AOx1      Exercises Total Joint Exercises Hip ABduction/ADduction: Strengthening;15 reps;Both;Seated Long Arc Quad: Strengthening;Left;15 reps;Seated Knee Flexion: Strengthening;Left;5 reps;Seated Marching in Standing: Strengthening;Both;15 reps;Seated    General Comments        Pertinent Vitals/Pain Pain Assessment: Faces Faces Pain Scale: Hurts even more Pain Location: Signs of  pain with transfers but no pain with seated exercises Pain Intervention(s): Monitored during session    Home Living                      Prior Function            PT Goals (current goals can now be found in the care plan section) Acute Rehab PT Goals Patient Stated Goal: Improve strength, function, and safety PT Goal Formulation: With family Time For Goal Achievement: 09/15/16 Potential to Achieve Goals: Fair Progress towards PT goals: Progressing toward goals    Frequency     BID      PT Plan Current plan remains appropriate    Co-evaluation              AM-PAC PT "6 Clicks" Daily Activity  Outcome Measure  Difficulty turning over in bed (including adjusting bedclothes, sheets and blankets)?: Unable Difficulty moving from lying on back to sitting on the side of the bed? : Unable Difficulty sitting down on and standing up from a chair with arms (e.g., wheelchair, bedside commode, etc,.)?: Unable Help needed moving to and from a bed to chair (including a wheelchair)?: A Lot Help needed walking in hospital room?: Total Help needed climbing 3-5 steps with a railing? : Total 6 Click Score: 7    End of Session Equipment Utilized During Treatment: Gait belt Activity Tolerance: Patient tolerated treatment well Patient left: in bed;with call bell/phone within reach;with family/visitor present;Other (comment) (EMS and RN in room to discharge) Nurse Communication: Mobility status PT Visit Diagnosis: Unsteadiness on feet (R26.81);Muscle weakness (generalized) (M62.81);History of falling (Z91.81);Repeated falls (R29.6);Pain Pain - Right/Left: Left Pain - part of body: Leg     Time: 4196-2229 PT Time Calculation (min) (ACUTE ONLY): 20 min  Charges:  $Therapeutic Exercise: 8-22 mins                    G Codes:       Lyndel Safe Nakiesha Rumsey PT, DPT     Roshan Roback 09/03/2016, 11:34 AM

## 2016-09-03 NOTE — Progress Notes (Signed)
Patient's discharge was cancelled last night because patient's daughter Stephen Roth stated that it was too late to transport him around 9 pm. Per Ortho PA note today patient is stable for D/C today and will go to [facility]. Patient's daughter Stephen Roth is agreeable for patient to go to [facility] today.   Per [facility] liaison [facility] authorization has been received. Patient is medically stable for D/C to [facility] today. Per [facility] patient can come today to room 805. RN will call report at [phone] and arrange EMS for transport. Clinical Education officer, museum (CSW) sent D/C orders to [facility] via [facility]. Patient's daughter Stephen Roth is at bedside and aware of above. Please reconsult if future social work needs arise. CSW signing off.   [employer], LCSW [phone]

## 2016-09-03 NOTE — Progress Notes (Signed)
   Subjective: 4 Days Post-Op Procedure(s) (LRB): OPEN REDUCTION INTERNAL FIXATION (ORIF) DISTAL FEMUR FRACTURE (Left) Patient reports pain as 0 on 0-10 scale.   Patient is well, and has had no acute complaints or problems.  Denies any CP, SOB, ABD pain. We will continue therapy today.    Objective: Vital signs in last 24 hours: Temp:  [97.4 F (36.3 C)-98.5 F (36.9 C)] 97.4 F (36.3 C) (08/21 2047) Pulse Rate:  [66-71] 66 (08/21 2047) Resp:  [16-19] 19 (08/21 2047) BP: (101-117)/(46-77) 117/77 (08/21 2047) SpO2:  [96 %-98 %] 96 % (08/21 2047)  Intake/Output from previous day: 08/21 0701 - 08/22 0700 In: 960 [P.O.:960] Out: 450 [Urine:450] Intake/Output this shift: No intake/output data recorded.   Recent Labs  09/01/16 0317  HGB 11.2*   No results for input(s): WBC, RBC, HCT, PLT in the last 72 hours. No results for input(s): NA, K, CL, CO2, BUN, CREATININE, GLUCOSE, CALCIUM in the last 72 hours. No results for input(s): LABPT, INR in the last 72 hours.  EXAM General - Patient is Alert, Appropriate and Oriented Extremity - Neurovascular intact Sensation intact distally Intact pulses distally Dorsiflexion/Plantar flexion intact No cellulitis present Compartment soft Dressing - dressing C/D/I and scant drainage Motor Function - intact, moving foot and toes well on exam.   Past Medical History:  Diagnosis Date  . Cancer ([place])    basal cell on eyelid  . CHF (congestive heart failure) ([place])   . Cirrhosis ([place])   . Hypertension   . Normal pressure hydrocephalus   . Sleep apnea     Assessment/Plan:   4 Days Post-Op Procedure(s) (LRB): OPEN REDUCTION INTERNAL FIXATION (ORIF) DISTAL FEMUR FRACTURE (Left) Active Problems:   Hip fracture (HCC)   Femur fracture, left (H[facility])  Estimated body mass index is 22.15 kg/m as calculated from the following:   Height as of this encounter: 5\' 10"  (1.778 m).   Weight as of this encounter: 70 kg (154 lb 6.4 oz). Advance  diet Up with therapy  Pain well controlled Plan on discharge to SNF today, [facility] Follow up with [facility] 09/12/16 for staple removal and Xrays  DVT Prophylaxis - Lovenox, Foot Pumps and TED hose Weight-Bearing as tolerated to left leg   T. Rachelle Hora, PA-C [place] 09/03/2016, 8:13 AM

## 2016-09-03 NOTE — Progress Notes (Signed)
Report called to [facility]. Report given to [place]. EMS called for transportation.  [place] dressing changed.  Wynema Birch, RN

## 2016-10-09 DIAGNOSIS — F0390 Unspecified dementia without behavioral disturbance: Secondary | ICD-10-CM | POA: Diagnosis not present

## 2016-10-09 DIAGNOSIS — F39 Unspecified mood [affective] disorder: Secondary | ICD-10-CM | POA: Diagnosis not present

## 2016-10-09 DIAGNOSIS — G912 (Idiopathic) normal pressure hydrocephalus: Secondary | ICD-10-CM | POA: Diagnosis not present

## 2016-10-09 DIAGNOSIS — E441 Mild protein-calorie malnutrition: Secondary | ICD-10-CM | POA: Diagnosis not present

## 2016-10-09 DIAGNOSIS — K7460 Unspecified cirrhosis of liver: Secondary | ICD-10-CM | POA: Diagnosis not present

## 2016-10-09 DIAGNOSIS — I472 Ventricular tachycardia: Secondary | ICD-10-CM | POA: Diagnosis not present

## 2016-10-30 DIAGNOSIS — K745 Biliary cirrhosis, unspecified: Secondary | ICD-10-CM | POA: Diagnosis not present

## 2016-10-30 DIAGNOSIS — F39 Unspecified mood [affective] disorder: Secondary | ICD-10-CM | POA: Diagnosis not present

## 2016-10-30 DIAGNOSIS — I5022 Chronic systolic (congestive) heart failure: Secondary | ICD-10-CM | POA: Diagnosis not present

## 2016-10-30 DIAGNOSIS — G3109 Other frontotemporal dementia: Secondary | ICD-10-CM | POA: Diagnosis not present

## 2016-11-20 DIAGNOSIS — H00016 Hordeolum externum left eye, unspecified eyelid: Secondary | ICD-10-CM | POA: Diagnosis not present

## 2016-11-20 DIAGNOSIS — H00039 Abscess of eyelid unspecified eye, unspecified eyelid: Secondary | ICD-10-CM | POA: Diagnosis not present

## 2017-01-02 DIAGNOSIS — F39 Unspecified mood [affective] disorder: Secondary | ICD-10-CM

## 2017-01-02 DIAGNOSIS — I472 Ventricular tachycardia: Secondary | ICD-10-CM

## 2017-01-02 DIAGNOSIS — K7460 Unspecified cirrhosis of liver: Secondary | ICD-10-CM | POA: Diagnosis not present

## 2017-01-02 DIAGNOSIS — N401 Enlarged prostate with lower urinary tract symptoms: Secondary | ICD-10-CM | POA: Diagnosis not present

## 2017-01-02 DIAGNOSIS — G309 Alzheimer's disease, unspecified: Secondary | ICD-10-CM | POA: Diagnosis not present

## 2017-01-02 DIAGNOSIS — I5020 Unspecified systolic (congestive) heart failure: Secondary | ICD-10-CM | POA: Diagnosis not present

## 2017-03-12 DIAGNOSIS — K7460 Unspecified cirrhosis of liver: Secondary | ICD-10-CM

## 2017-03-12 DIAGNOSIS — F39 Unspecified mood [affective] disorder: Secondary | ICD-10-CM | POA: Diagnosis not present

## 2017-03-12 DIAGNOSIS — Z8679 Personal history of other diseases of the circulatory system: Secondary | ICD-10-CM | POA: Diagnosis not present

## 2017-03-12 DIAGNOSIS — F0390 Unspecified dementia without behavioral disturbance: Secondary | ICD-10-CM | POA: Diagnosis not present

## 2017-04-24 DIAGNOSIS — Z8679 Personal history of other diseases of the circulatory system: Secondary | ICD-10-CM

## 2017-04-24 DIAGNOSIS — G309 Alzheimer's disease, unspecified: Secondary | ICD-10-CM

## 2017-04-24 DIAGNOSIS — K7460 Unspecified cirrhosis of liver: Secondary | ICD-10-CM

## 2017-04-24 DIAGNOSIS — I5020 Unspecified systolic (congestive) heart failure: Secondary | ICD-10-CM

## 2017-04-24 DIAGNOSIS — F39 Unspecified mood [affective] disorder: Secondary | ICD-10-CM | POA: Diagnosis not present

## 2017-06-30 DIAGNOSIS — I5022 Chronic systolic (congestive) heart failure: Secondary | ICD-10-CM | POA: Diagnosis not present

## 2017-06-30 DIAGNOSIS — N400 Enlarged prostate without lower urinary tract symptoms: Secondary | ICD-10-CM

## 2017-06-30 DIAGNOSIS — K7460 Unspecified cirrhosis of liver: Secondary | ICD-10-CM | POA: Diagnosis not present

## 2017-06-30 DIAGNOSIS — Z8679 Personal history of other diseases of the circulatory system: Secondary | ICD-10-CM

## 2017-06-30 DIAGNOSIS — F39 Unspecified mood [affective] disorder: Secondary | ICD-10-CM

## 2017-06-30 DIAGNOSIS — F0390 Unspecified dementia without behavioral disturbance: Secondary | ICD-10-CM | POA: Diagnosis not present

## 2017-09-03 DIAGNOSIS — R609 Edema, unspecified: Secondary | ICD-10-CM

## 2017-09-09 DIAGNOSIS — G309 Alzheimer's disease, unspecified: Secondary | ICD-10-CM | POA: Diagnosis not present

## 2017-09-09 DIAGNOSIS — I472 Ventricular tachycardia: Secondary | ICD-10-CM

## 2017-09-09 DIAGNOSIS — N401 Enlarged prostate with lower urinary tract symptoms: Secondary | ICD-10-CM | POA: Diagnosis not present

## 2017-09-09 DIAGNOSIS — K7460 Unspecified cirrhosis of liver: Secondary | ICD-10-CM | POA: Diagnosis not present

## 2017-09-09 DIAGNOSIS — F39 Unspecified mood [affective] disorder: Secondary | ICD-10-CM

## 2017-09-09 DIAGNOSIS — I5020 Unspecified systolic (congestive) heart failure: Secondary | ICD-10-CM | POA: Diagnosis not present

## 2017-09-15 DIAGNOSIS — I5023 Acute on chronic systolic (congestive) heart failure: Secondary | ICD-10-CM | POA: Diagnosis not present

## 2017-11-12 DIAGNOSIS — I5022 Chronic systolic (congestive) heart failure: Secondary | ICD-10-CM | POA: Diagnosis not present

## 2017-11-12 DIAGNOSIS — K7460 Unspecified cirrhosis of liver: Secondary | ICD-10-CM

## 2017-11-12 DIAGNOSIS — F39 Unspecified mood [affective] disorder: Secondary | ICD-10-CM

## 2017-11-12 DIAGNOSIS — I472 Ventricular tachycardia: Secondary | ICD-10-CM | POA: Diagnosis not present

## 2017-11-12 DIAGNOSIS — G912 (Idiopathic) normal pressure hydrocephalus: Secondary | ICD-10-CM | POA: Diagnosis not present

## 2017-11-12 DIAGNOSIS — N183 Chronic kidney disease, stage 3 (moderate): Secondary | ICD-10-CM

## 2017-11-18 ENCOUNTER — Emergency Department: Payer: Medicare Other

## 2017-11-18 ENCOUNTER — Observation Stay
Admission: EM | Admit: 2017-11-18 | Discharge: 2017-11-19 | Disposition: A | Discharge status: Home / Self Care | Payer: Medicare Other | Attending: Internal Medicine | Admitting: Internal Medicine

## 2017-11-18 ENCOUNTER — Encounter: Payer: Self-pay | Admitting: Emergency Medicine

## 2017-11-18 ENCOUNTER — Observation Stay
Admit: 2017-11-18 | Discharge: 2017-11-18 | Disposition: A | Discharge status: Home / Self Care | Payer: Medicare Other | Attending: Internal Medicine | Admitting: Internal Medicine

## 2017-11-18 ENCOUNTER — Other Ambulatory Visit: Payer: Self-pay

## 2017-11-18 DIAGNOSIS — N179 Acute kidney failure, unspecified: Secondary | ICD-10-CM | POA: Insufficient documentation

## 2017-11-18 DIAGNOSIS — G4730 Sleep apnea, unspecified: Secondary | ICD-10-CM | POA: Insufficient documentation

## 2017-11-18 DIAGNOSIS — F0390 Unspecified dementia without behavioral disturbance: Secondary | ICD-10-CM | POA: Diagnosis not present

## 2017-11-18 DIAGNOSIS — I110 Hypertensive heart disease with heart failure: Secondary | ICD-10-CM | POA: Diagnosis not present

## 2017-11-18 DIAGNOSIS — Z7982 Long term (current) use of aspirin: Secondary | ICD-10-CM | POA: Diagnosis not present

## 2017-11-18 DIAGNOSIS — Z66 Do not resuscitate: Secondary | ICD-10-CM | POA: Insufficient documentation

## 2017-11-18 DIAGNOSIS — Z79899 Other long term (current) drug therapy: Secondary | ICD-10-CM | POA: Diagnosis not present

## 2017-11-18 DIAGNOSIS — I5022 Chronic systolic (congestive) heart failure: Secondary | ICD-10-CM | POA: Diagnosis not present

## 2017-11-18 DIAGNOSIS — E860 Dehydration: Secondary | ICD-10-CM | POA: Insufficient documentation

## 2017-11-18 DIAGNOSIS — R55 Syncope and collapse: Principal | ICD-10-CM | POA: Diagnosis present

## 2017-11-18 DIAGNOSIS — Z96653 Presence of artificial knee joint, bilateral: Secondary | ICD-10-CM | POA: Insufficient documentation

## 2017-11-18 DIAGNOSIS — Z993 Dependence on wheelchair: Secondary | ICD-10-CM | POA: Insufficient documentation

## 2017-11-18 DIAGNOSIS — Z950 Presence of cardiac pacemaker: Secondary | ICD-10-CM | POA: Diagnosis not present

## 2017-11-18 DIAGNOSIS — Z96642 Presence of left artificial hip joint: Secondary | ICD-10-CM | POA: Diagnosis not present

## 2017-11-18 DIAGNOSIS — R4182 Altered mental status, unspecified: Secondary | ICD-10-CM | POA: Diagnosis present

## 2017-11-18 LAB — URINALYSIS, COMPLETE (UACMP) WITH MICROSCOPIC
BILIRUBIN URINE: NEGATIVE
Glucose, UA: NEGATIVE mg/dL
Hgb urine dipstick: NEGATIVE
Ketones, ur: 5 mg/dL — AB
Nitrite: POSITIVE — AB
Protein, ur: NEGATIVE mg/dL
Specific Gravity, Urine: 1.01 (ref 1.005–1.030)
pH: 5 (ref 5.0–8.0)

## 2017-11-18 LAB — TROPONIN I
Troponin I: <0.03 ng/mL (ref <0.03)
Troponin I: <0.03 ng/mL (ref <0.03)
Troponin I: <0.03 ng/mL (ref <0.03)

## 2017-11-18 LAB — CBC WITH DIFFERENTIAL/PLATELET
Abs Immature Granulocytes: 0.04 10*3/uL (ref 0.00–0.07)
BASOS ABS: 0.1 10*3/uL (ref 0.0–0.1)
BASOS PCT: 1 %
EOS ABS: 0.2 10*3/uL (ref 0.0–0.5)
Eosinophils Relative: 3 %
HCT: 46.7 % (ref 39.0–52.0)
Hemoglobin: 15.6 g/dL (ref 13.0–17.0)
Immature Granulocytes: 1 %
Lymphocytes Relative: 22 %
Lymphs Abs: 1.9 10*3/uL (ref 0.7–4.0)
MCH: 32.3 pg (ref 26.0–34.0)
MCHC: 33.4 g/dL (ref 30.0–36.0)
MCV: 96.7 fL (ref 80.0–100.0)
Monocytes Absolute: 0.9 10*3/uL (ref 0.1–1.0)
Monocytes Relative: 10 %
NEUTROS PCT: 63 %
NRBC: 0 % (ref 0.0–0.2)
Neutro Abs: 5.5 10*3/uL (ref 1.7–7.7)
PLATELETS: 290 10*3/uL (ref 150–400)
RBC: 4.83 MIL/uL (ref 4.22–5.81)
RDW: 12.7 % (ref 11.5–15.5)
WBC: 8.6 10*3/uL (ref 4.0–10.5)

## 2017-11-18 LAB — COMPREHENSIVE METABOLIC PANEL
ALBUMIN: 4 g/dL (ref 3.5–5.0)
ALT: 20 U/L (ref 0–44)
AST: 27 U/L (ref 15–41)
Alkaline Phosphatase: 78 U/L (ref 38–126)
Anion gap: 9 (ref 5–15)
BILIRUBIN TOTAL: 1 mg/dL (ref 0.3–1.2)
BUN: 24 mg/dL — ABNORMAL HIGH (ref 8–23)
CALCIUM: 9.7 mg/dL (ref 8.9–10.3)
CO2: 28 mmol/L (ref 22–32)
Chloride: 103 mmol/L (ref 98–111)
Creatinine, Ser: 1.31 mg/dL — ABNORMAL HIGH (ref 0.61–1.24)
GFR calc Af Amer: 54 mL/min — ABNORMAL LOW (ref >60)
GFR calc non Af Amer: 46 mL/min — ABNORMAL LOW (ref >60)
GLUCOSE: 108 mg/dL — AB (ref 70–99)
POTASSIUM: 4.1 mmol/L (ref 3.5–5.1)
Sodium: 140 mmol/L (ref 135–145)
TOTAL PROTEIN: 7.5 g/dL (ref 6.5–8.1)

## 2017-11-18 LAB — GLUCOSE, CAPILLARY: GLUCOSE-CAPILLARY: 89 mg/dL (ref 70–99)

## 2017-11-18 LAB — AMMONIA: Ammonia: 50 umol/L — ABNORMAL HIGH (ref 9–35)

## 2017-11-18 MED ORDER — ACETAMINOPHEN 325 MG PO TABS
650.0000 mg | ORAL_TABLET | Freq: Four times a day (QID) | ORAL | Status: DC | PRN
  Administered 2017-11-19: 650 mg via ORAL
  Filled 2017-11-18: qty 2

## 2017-11-18 MED ORDER — ENOXAPARIN SODIUM 40 MG/0.4ML ~~LOC~~ SOLN
30.0000 mg | SUBCUTANEOUS | Status: DC
  Filled 2017-11-18: qty 0.4

## 2017-11-18 MED ORDER — ENOXAPARIN SODIUM 40 MG/0.4ML ~~LOC~~ SOLN: 40.0000 mg | SUBCUTANEOUS | Status: DC

## 2017-11-18 MED ORDER — ACETAMINOPHEN 650 MG RE SUPP: 650.0000 mg | Freq: Four times a day (QID) | RECTAL | Status: DC | PRN

## 2017-11-18 MED ORDER — SODIUM CHLORIDE 0.45 % IV SOLN
INTRAVENOUS | Status: AC
  Administered 2017-11-18: 14:00:00 via INTRAVENOUS

## 2017-11-18 MED ORDER — POLYETHYLENE GLYCOL 3350 17 G PO PACK: 17.0000 g | PACK | Freq: Every day | ORAL | Status: DC | PRN

## 2017-11-18 MED ORDER — ENOXAPARIN SODIUM 30 MG/0.3ML ~~LOC~~ SOLN
30.0000 mg | SUBCUTANEOUS | Status: DC
  Administered 2017-11-18: 30 mg via SUBCUTANEOUS
  Filled 2017-11-18: qty 0.3

## 2017-11-18 MED ORDER — SODIUM CHLORIDE 0.9 % IV BOLUS
500.0000 mL | Freq: Once | INTRAVENOUS | Status: AC
  Administered 2017-11-18: 500 mL via INTRAVENOUS

## 2017-11-18 MED ORDER — ALBUTEROL SULFATE (2.5 MG/3ML) 0.083% IN NEBU: 2.5000 mg | INHALATION_SOLUTION | RESPIRATORY_TRACT | Status: DC | PRN

## 2017-11-18 MED ORDER — ONDANSETRON HCL 4 MG/2ML IJ SOLN: 4.0000 mg | Freq: Four times a day (QID) | INTRAMUSCULAR | Status: DC | PRN

## 2017-11-18 MED ORDER — ONDANSETRON HCL 4 MG PO TABS: 4.0000 mg | ORAL_TABLET | Freq: Four times a day (QID) | ORAL | Status: DC | PRN

## 2017-11-18 MED ORDER — SODIUM CHLORIDE 0.9% FLUSH
3.0000 mL | Freq: Two times a day (BID) | INTRAVENOUS | Status: DC
  Administered 2017-11-18 – 2017-11-19 (×3): 3 mL via INTRAVENOUS

## 2017-11-18 NOTE — ED Notes (Signed)
Pt refusing to be straight cathed to obtain urine sample. Pt states "I am not going to stick anything in my penis". Will await daughter's return.

## 2017-11-18 NOTE — H&P (Signed)
[facility] NAME: Stephen Roth    MR#:  [id number]  DATE OF BIRTH:  1925-11-16  DATE OF ADMISSION:  11/18/2017  PRIMARY CARE PHYSICIAN: Adin Hector, MD   REQUESTING/REFERRING PHYSICIAN: Syncope  CHIEF COMPLAINT:   Chief Complaint  Patient presents with  . Altered Mental Status    HISTORY OF PRESENT ILLNESS:  Stephen Roth  is a 84 y.o. male with a known history of dementia, normal pressure hydrocephalus, cirrhosis, chronic systolic CHF, pacemaker present to the emergency room from his cardiologist Dr. Bethanne Ginger office after having a syncope in wheelchair.  Patient does not ambulate and is wheelchair-bound.  Was sitting in a wheelchair he had an episode where he passed out for 2 to 3 minutes.  And return to his normal.  Sent to the emergency room.  Here pacemaker interrogated with no significant abnormalities.  Troponin normal.  Found to have acute kidney injury with creatinine 1.8 with baseline of 0.7.  Patient visited his dentist yesterday.  And also over the last few days has been not eating and drinking well.  Takes Lasix.  Being admitted for observation for syncope. History obtained from daughter at bedside who is the healthcare power of attorney.  Old records reviewed.  Discussed with ED staff.  PAST MEDICAL HISTORY:   Past Medical History:  Diagnosis Date  . Cancer ([place])    basal cell on eyelid  . CHF (congestive heart failure) ([employer])   . Cirrhosis ([place])   . Hypertension   . Normal pressure hydrocephalus ([facility])   . Sleep apnea     PAST SURGICAL HISTORY:   Past Surgical History:  Procedure Laterality Date  . HERNIA REPAIR    . HIP ARTHROPLASTY Left 01/20/2016   Procedure: ARTHROPLASTY BIPOLAR HIP (HEMIARTHROPLASTY);  Surgeon: Corky Mull, MD;  Location: [facility] ORS;  Service: Orthopedics;  Laterality: Left;  . IMPLANTABLE CARDIOVERTER DEFIBRILLATOR (ICD) GENERATOR CHANGE Left 07/13/2015   Procedure: ICD GENERATOR  CHANGE;  Surgeon: Marzetta Board, MD;  Location: [facility] ORS;  Service: Cardiovascular;  Laterality: Left;  . JOINT REPLACEMENT Bilateral    knees  . ORIF FEMUR FRACTURE Left 08/30/2016   Procedure: OPEN REDUCTION INTERNAL FIXATION (ORIF) DISTAL FEMUR FRACTURE;  Surgeon: Hessie Knows, MD;  Location: [facility] ORS;  Service: Orthopedics;  Laterality: Left;  . PACEMAKER INSERTION  2006   Pace maker, defibulator    SOCIAL HISTORY:   Social History   Tobacco Use  . Smoking status: Never Smoker  . Smokeless tobacco: Never Used  Substance Use Topics  . Alcohol use: No    FAMILY HISTORY:   Family History  Problem Relation Age of Onset  . Heart disease Father   . Bipolar disorder Brother     DRUG ALLERGIES:   Allergies  Allergen Reactions  . Cephalexin Other (See Comments)    Confusion, hives, hallucinatons  . Clindamycin/Lincomycin Other (See Comments)    Confusion, hives, hallucinations  . Morphine And Related Other (See Comments)    REVIEW OF SYSTEMS:   Review of Systems  Unable to perform ROS: Dementia    MEDICATIONS AT HOME:   Prior to Admission medications   Medication Sig Start Date End Date Taking? Authorizing Provider  furosemide (LASIX) 40 MG tablet Take 40 mg by mouth daily. 11/05/17  Yes [provider]  memantine (NAMENDA) 10 MG tablet Take 10 mg by mouth 2 (two) times daily. 10/30/17  Yes [provider]  spironolactone (ALDACTONE)  25 MG tablet Take 25 mg by mouth daily. 11/12/17  Yes [provider]  acetaminophen (TYLENOL) 325 MG tablet Take by mouth every 6 (six) hours as needed.    [provider]  aspirin EC 81 MG tablet Take 81 mg by mouth daily.    [provider]  bisacodyl (DULCOLAX) 10 MG suppository Place 1 suppository (10 mg total) rectally daily as needed for moderate constipation. 01/23/16   Vaughan Basta, MD  Cholecalciferol (VITAMIN D-3 PO) Take 1,000 Units by mouth every evening.     [provider]  Docusate Calcium (STOOL SOFTENER PO) Take 1 tablet by mouth 2 (two) times daily.    [provider]  dutasteride (AVODART) 0.5 MG capsule Take 0.5 mg by mouth daily.    [provider]  enoxaparin (LOVENOX) 30 MG/0.3ML injection Inject 0.3 mLs (30 mg total) into the skin daily. 09/02/16 09/16/16  Duanne Guess, PA-C  HYDROcodone-acetaminophen (NORCO/VICODIN) 5-325 MG tablet Take 1 tablet by mouth every 4 (four) hours as needed for moderate pain (breakthrough pain). 09/02/16   Dustin Flock, MD  lamoTRIgine (LAMICTAL) 25 MG tablet Take 2 tablets (50 mg total) by mouth at bedtime. Patient taking differently: Take 25 mg by mouth at bedtime.  01/23/16   Vaughan Basta, MD  Misc Natural Products (LUTEIN 20 PO) Take 1 capsule by mouth every evening.    [provider]  senna-docusate (SENOKOT-S) 8.6-50 MG tablet Take 1 tablet by mouth at bedtime as needed for mild constipation. 01/23/16   Vaughan Basta, MD     VITAL SIGNS:  Blood pressure 112/71, pulse 69, temperature (!) 97.5 F (36.4 C), temperature source Axillary, resp. rate 20, height 5\' 10"  (1.778 m), weight 70 kg, SpO2 100 %.  PHYSICAL EXAMINATION:  Physical Exam  GENERAL:  84 y.o.-year-old patient lying in the bed with no acute distress.  EYES: Pupils equal, round, reactive to light and accommodation. No scleral icterus. Extraocular muscles intact.  HEENT: Head atraumatic, normocephalic. Oropharynx and nasopharynx clear. No oropharyngeal erythema, moist oral mucosa  NECK:  Supple, no jugular venous distention. No thyroid enlargement, no tenderness.  LUNGS: Normal breath sounds bilaterally, no wheezing, rales, rhonchi. No use of accessory muscles of respiration.  CARDIOVASCULAR: S1, S2 normal. No murmurs, rubs, or gallops.  ABDOMEN: Soft, nontender, nondistended. Bowel sounds present. No organomegaly or mass.  EXTREMITIES: No pedal edema, cyanosis, or clubbing. + 2 pedal & radial  pulses b/l.   NEUROLOGIC: Cranial nerves II through XII are intact. Motor 4/5 lower extremities PSYCHIATRIC: The patient is alert and awake.  Pleasantly confused SKIN: No obvious rash, lesion, or ulcer.   LABORATORY PANEL:   CBC Recent Labs  Lab 11/18/17 1137  WBC 8.6  HGB 15.6  HCT 46.7  PLT 290   ------------------------------------------------------------------------------------------------------------------  Chemistries  Recent Labs  Lab 11/18/17 1137  NA 140  K 4.1  CL 103  CO2 28  GLUCOSE 108*  BUN 24*  CREATININE 1.31*  CALCIUM 9.7  AST 27  ALT 20  ALKPHOS 78  BILITOT 1.0   ------------------------------------------------------------------------------------------------------------------  Cardiac Enzymes Recent Labs  Lab 11/18/17 1137  TROPONINI <0.03   ------------------------------------------------------------------------------------------------------------------  RADIOLOGY:  Dg Chest 2 View  Result Date: 11/18/2017 CLINICAL DATA:  The patient was unresponsive for approximately 2 minutes today. EXAM: CHEST - 2 VIEW COMPARISON:  Single-view of the chest 01/20/2016. CT chest, abdomen and pelvis 12/31/2012. FINDINGS: The lungs are clear. Heart size is mildly enlarged with an AICD in place. No pneumothorax  or pleural effusion. No acute or focal bony abnormality. IMPRESSION: No acute disease. Cardiomegaly. Atherosclerosis. Electronically Signed   By: Inge Rise M.D.   On: 11/18/2017 12:31   Ct Head Wo Contrast  Result Date: 11/18/2017 CLINICAL DATA:  Mental status change.  History of dementia. EXAM: CT HEAD WITHOUT CONTRAST TECHNIQUE: Contiguous axial images were obtained from the base of the skull through the vertex without intravenous contrast. COMPARISON:  Head CT 01/11/2015 FINDINGS: Brain: Stable age related cerebral atrophy, ventriculomegaly and periventricular white matter disease. No extra-axial fluid collections are identified. No CT findings for  acute hemispheric infarction or intracranial hemorrhage. Remote left frontal infarct. No mass lesions. The brainstem and cerebellum are normal. Vascular: No hyperdense vessel or unexpected calcification. Skull: No skull fracture or bone lesions. Sinuses/Orbits: The paranasal sinuses and mastoid air cells are clear. The globes are intact. Other: No scalp lesions or hematoma. IMPRESSION: 1. Overall stable age related cerebral atrophy, ventriculomegaly and periventricular white matter disease. 2. No new/acute intracranial findings or mass lesions. Electronically Signed   By: Marijo Sanes M.D.   On: 11/18/2017 12:10   IMPRESSION AND PLAN:   * Syncope.  Likely dehydration.  Or vasovagal.  Pacemaker interrogated and no arrhythmias.  Will admit to medical floor with telemetry monitoring.  Check echocardiogram.  Repeat troponin.  * AKI due to decrease PO intake Start IVF Repeat labs in AM Monitor I/os  * Chronic systolic chf No signs of fluid overload. Hold lasix  All the records are reviewed and case discussed with ED provider. Management plans discussed with the patient, family and they are in agreement.  CODE STATUS: DO NOT RESUSCITATE  TOTAL TIME TAKING CARE OF THIS PATIENT: 40 minutes.   Leia Alf Sherley Leser M.D on 11/18/2017 at 1:27 PM  Between 7am to 6pm - Pager - [phone]  After 6pm go to [email or web address] - password [email or web address] [facility]  [facility]  Office  [phone]  CC: Primary care physician; Adin Hector, MD  Note: This dictation was prepared with Dragon dictation along with smaller phrase technology. Any transcriptional errors that result from this process are unintentional.

## 2017-11-18 NOTE — ED Notes (Signed)
Pt assisted with urinal. Urine specimen obtained. Pericare performed and brief changed. Pt repositioned in bed.

## 2017-11-18 NOTE — Care Management Note (Signed)
Case Management Note  Patient Details  Name: Stephen Roth. MRN: [id number] Date of Birth: 1925-01-15  Subjective/Objective:       Patient is being seen in the ED for syncope.  He as seeing Dr. Ubaldo Glassing in the [facility] when he had a syncopal episode.  Patient is wheelchair bound and is from [facility] facility where he has been since Sept of 2018.  Daughter, [name], and wife, [name] are at the bedside.  Daughter states that [facility] is holding his bed and the family would like for him to go back at discharge.  Patient is being placed under observation.  MOON signed and copy given.   Doran Clay RN BSN [phone]               Action/Plan:   Expected Discharge Date:                  Expected Discharge Plan:  Skilled Nursing Facility  In-House Referral:  Clinical Social Work  Discharge planning Services  CM Consult  Post Acute Care Choice:    Choice offered to:     DME Arranged:    DME Agency:     HH Arranged:    [place] Agency:     Status of Service:  In process, will continue to follow  If discussed at Long Length of Stay Meetings, dates discussed:    Additional Comments:  Shelbie Hutching, RN 11/18/2017, 2:15 PM

## 2017-11-18 NOTE — ED Notes (Signed)
ED Provider at bedside. 

## 2017-11-18 NOTE — Progress Notes (Signed)
Advance care planning  Purpose of Encounter Syncope, code status discussion  Parties in Attendance Patient, wife and daughter(HCPOA) - Stephen Roth  Patients Decisional capacity Dementia, unable to make medical decisions  Discussed regarding syncope, dementia and need for admission.  Code status discussed and daughter wants patient to be DNR/DNI DNR/DNI Orders entered   Time spent - 17 minutes

## 2017-11-18 NOTE — Clinical Social Work Note (Signed)
Clinical Social Work Assessment  Patient Details  Name: Stephen Roth. MRN: [id number] Date of Birth: 1925/09/25  Date of referral:  11/18/17               Reason for consult:  Facility Placement                Permission sought to share information with:  Facility Sport and exercise psychologist, Family Supports Permission granted to share information::  Yes, Release of Information Signed  Name::     [place] Daughter [phone] or [place], Ghrist [phone]  [phone] or [place], Bodie [phone]   Agency::  SNF admissions  Relationship::     Contact Information:     Housing/Transportation Living arrangements for the past 2 months:  [place] of Information:  Adult Children Patient Interpreter Needed:  None Criminal Activity/Legal Involvement Pertinent to Current Situation/Hospitalization:  No - Comment as needed Significant Relationships:  Adult Children, Spouse Lives with:  Facility Resident Do you feel safe going back to the place where you live?  Yes Need for family participation in patient care:  Yes (Comment)  Care giving concerns:  Patient's family did not express any concerns about patient returning back to [facility].   Social Worker assessment / plan:  Patient is a 84 year old male who is alert and oriented x3.  CSW completed assessment by speaking with patient's daughter [name] due to patient being lethargic.  CSW was inforemed that patient is a long term care resident at [facility], an he has been living there for about one and half years.  Patient's daughter stated they have been pleased with the care that patient has been receiving.  Patient's daughter states they would like patient to return back to [facility] and they are paying to hold the bed.  Patient's daughter was informed that patient will most likely transfer to the hospital and depending on which unit patient goes to will determine who the social worker is that will be  following patient's case and facilitating discharge planning back to [facility].  Patient's daughter expressed understanding, and gave CSW permission to contact SNF with updates on patient's progress.   Employment status:  Retired Forensic scientist:  Information systems manager, Medicaid In [place] PT Recommendations:  Not assessed at this time [place] / Referral to community resources:  [place]  Patient/Family's Response to care:  Patient's family is agreeable to having him return back to [facility].  Patient/Family's Understanding of and Emotional Response to Diagnosis, Current Treatment, and Prognosis:  Patient's family are hopeful that he will improve and can return back to [facility] soon.  Emotional Assessment Appearance:  Appears stated age Attitude/Demeanor/Rapport:    Affect (typically observed):  Appropriate, Calm Orientation:  Oriented to Self, Oriented to Place, Oriented to Situation Alcohol / Substance use:  Not Applicable Psych involvement (Current and /or in the community):  No (Comment)  Discharge Needs  Concerns to be addressed:  Care Coordination Readmission within the last 30 days:  No Current discharge risk:  None Barriers to Discharge:  Continued Medical Work up   Anell Barr 11/18/2017, 5:17 PM

## 2017-11-18 NOTE — ED Provider Notes (Signed)
[facility] Emergency Department Provider Note  ____________________________________________  Time seen: Approximately 11:41 AM  I have reviewed the triage vital signs and the nursing notes.   HISTORY  Chief Complaint Altered Mental Status   HPI Quavion Boule. is a 84 y.o. male with a history of dementia, cirrhosis of the liver, normal pressure hydrocephalus, CHF, and hypertension who presents for evaluation of syncopal event.  Patient was at a regular check up appointment with his cardiologist this morning with his daughter and his wife.  Daughter reports that the room was very hot.  Both her and patient's wife took off there is sweater but patient refused to take his off.  He was sitting in a chair when he started saying that he felt dizzy.  Patient then had a syncopal event.  He did not fall off the chair.  After he was laid on the bed patient regained consciousness.  The whole episode lasted 1 to 2 minutes.  An EKG was done at that time which showed a paced rhythm.  Patient denies headache, chest pain, shortness of breath, back pain, abdominal pain, nausea or vomiting.  Of note, patient had 2 teeth pulled out yesterday but no other recent surgeries or procedures.  Patient does have a history of syncope which led to the implantation of a AICD device.  Since then patient has not had any further episodes of syncope.  Past Medical History:  Diagnosis Date  . Cancer ([place])    basal cell on eyelid  . CHF (congestive heart failure) ([place])   . Cirrhosis ([place])   . Hypertension   . Normal pressure hydrocephalus (H[facility])   . Sleep apnea     Patient Active Problem List   Diagnosis Date Noted  . Femur fracture, left ([place]) 08/30/2016  . Hip fracture ([place]) 01/20/2016  . GI bleed 08/10/2015    Past Surgical History:  Procedure Laterality Date  . HERNIA REPAIR    . HIP ARTHROPLASTY Left 01/20/2016   Procedure: ARTHROPLASTY BIPOLAR HIP (HEMIARTHROPLASTY);  Surgeon:  Corky Mull, MD;  Location: [facility] ORS;  Service: Orthopedics;  Laterality: Left;  . IMPLANTABLE CARDIOVERTER DEFIBRILLATOR (ICD) GENERATOR CHANGE Left 07/13/2015   Procedure: ICD GENERATOR CHANGE;  Surgeon: Marzetta Board, MD;  Location: [facility] ORS;  Service: Cardiovascular;  Laterality: Left;  . JOINT REPLACEMENT Bilateral    knees  . ORIF FEMUR FRACTURE Left 08/30/2016   Procedure: OPEN REDUCTION INTERNAL FIXATION (ORIF) DISTAL FEMUR FRACTURE;  Surgeon: Hessie Knows, MD;  Location: [facility] ORS;  Service: Orthopedics;  Laterality: Left;  . PACEMAKER INSERTION  2006   Pace maker, defibulator    Prior to Admission medications   Medication Sig Start Date End Date Taking? Authorizing Provider  acetaminophen (TYLENOL) 325 MG tablet Take by mouth every 6 (six) hours as needed.    [provider]  aspirin EC 81 MG tablet Take 81 mg by mouth daily.    [provider]  bisacodyl (DULCOLAX) 10 MG suppository Place 1 suppository (10 mg total) rectally daily as needed for moderate constipation. 01/23/16   Vaughan Basta, MD  Cholecalciferol (VITAMIN D-3 PO) Take 1,000 Units by mouth every evening.     [provider]  Docusate Calcium (STOOL SOFTENER PO) Take 1 tablet by mouth 2 (two) times daily.    [provider]  dutasteride (AVODART) 0.5 MG capsule Take 0.5 mg by mouth daily.    [provider]  enoxaparin (LOVENOX) 30 MG/0.3ML injection Inject 0.3  mLs (30 mg total) into the skin daily. 09/02/16 09/16/16  Duanne Guess, PA-C  HYDROcodone-acetaminophen (NORCO/VICODIN) 5-325 MG tablet Take 1 tablet by mouth every 4 (four) hours as needed for moderate pain (breakthrough pain). 09/02/16   Dustin Flock, MD  lamoTRIgine (LAMICTAL) 25 MG tablet Take 2 tablets (50 mg total) by mouth at bedtime. Patient taking differently: Take 25 mg by mouth at bedtime.  01/23/16   Vaughan Basta, MD  Misc Natural Products (LUTEIN 20 PO) Take 1 capsule by mouth every  evening.    [provider]  senna-docusate (SENOKOT-S) 8.6-50 MG tablet Take 1 tablet by mouth at bedtime as needed for mild constipation. 01/23/16   Vaughan Basta, MD    Allergies Cephalexin; Clindamycin/lincomycin; and Morphine and related  Family History  Problem Relation Age of Onset  . Heart disease Father   . Bipolar disorder Brother     Social History Social History   Tobacco Use  . Smoking status: Never Smoker  . Smokeless tobacco: Never Used  Substance Use Topics  . Alcohol use: No  . Drug use: No    Review of Systems  Constitutional: Negative for fever. + syncope Eyes: Negative for visual changes. ENT: Negative for sore throat. Neck: No neck pain  Cardiovascular: Negative for chest pain. Respiratory: Negative for shortness of breath. Gastrointestinal: Negative for abdominal pain, vomiting or diarrhea. Genitourinary: Negative for dysuria. Musculoskeletal: Negative for back pain. Skin: Negative for rash. Neurological: Negative for headaches, weakness or numbness. Psych: No SI or HI  ____________________________________________   PHYSICAL EXAM:  VITAL SIGNS: ED Triage Vitals  Enc Vitals Group     BP 11/18/17 1127 132/78     Pulse --      Resp 11/18/17 1127 18     Temp 11/18/17 1128 (!) 97.5 F (36.4 C)     Temp Source 11/18/17 1128 Axillary     SpO2 11/18/17 1127 100 %     Weight 11/18/17 1125 154 lb 5.2 oz (70 kg)     Height 11/18/17 1125 5\' 10"  (1.778 m)     Head Circumference --      Peak Flow --      Pain Score 11/18/17 1125 0     Pain Loc --      Pain Edu? --      Excl. in [place]? --     Constitutional: Alert and oriented. Well appearing and in no apparent distress. HEENT:      Head: Normocephalic and atraumatic.         Eyes: Conjunctivae are normal. Sclera is non-icteric.  Pupils are asymmetric.  Right pupil is 2 mm and left pupil is 1 mm, they are both reactive otherwise.      Mouth/Throat: Mucous membranes are moist.        Neck: Supple with no signs of meningismus. Cardiovascular: Regular rate and rhythm. No murmurs, gallops, or rubs. 2+ symmetrical distal pulses are present in all extremities. No JVD. Respiratory: Normal respiratory effort. Lungs are clear to auscultation bilaterally. No wheezes, crackles, or rhonchi.  Gastrointestinal: Soft, non tender, and non distended with positive bowel sounds. No rebound or guarding. Musculoskeletal: Nontender with normal range of motion in all extremities. No edema, cyanosis, or erythema of extremities. Neurologic: Normal speech and language.  Face symmetric, extraocular movements are intact, asymmetric but reactive pupils, intact strength and sensation x4, no pronator drift, no dysmetria on FNF.   Skin: Skin is warm, dry and intact. No rash noted. Psychiatric: Mood and  affect are normal. Speech and behavior are normal.  ____________________________________________   LABS (all labs ordered are listed, but only abnormal results are displayed)  Labs Reviewed  COMPREHENSIVE METABOLIC PANEL - Abnormal; Notable for the following components:      Result Value   Glucose, Bld 108 (*)    BUN 24 (*)    Creatinine, Ser 1.31 (*)    GFR calc non Af Amer 46 (*)    GFR calc Af Amer 54 (*)    All other components within normal limits  GLUCOSE, CAPILLARY  CBC WITH DIFFERENTIAL/PLATELET  TROPONIN I  AMMONIA  URINALYSIS, COMPLETE (UACMP) WITH MICROSCOPIC   ____________________________________________  EKG   ED ECG REPORT I, Rudene Re, the attending physician, personally viewed and interpreted this ECG.  Atrial paced complexes, rate of 63, nonspecific intraventricular conduction delay, normal axis, no ST elevations or depressions.  Unchanged from prior. ____________________________________________  RADIOLOGY  I have personally reviewed the images performed during this visit and I agree with the Radiologist's read.   Interpretation by Radiologist:  Dg Chest  2 View  Result Date: 11/18/2017 CLINICAL DATA:  The patient was unresponsive for approximately 2 minutes today. EXAM: CHEST - 2 VIEW COMPARISON:  Single-view of the chest 01/20/2016. CT chest, abdomen and pelvis 12/31/2012. FINDINGS: The lungs are clear. Heart size is mildly enlarged with an AICD in place. No pneumothorax or pleural effusion. No acute or focal bony abnormality. IMPRESSION: No acute disease. Cardiomegaly. Atherosclerosis. Electronically Signed   By: Inge Rise M.D.   On: 11/18/2017 12:31   Ct Head Wo Contrast  Result Date: 11/18/2017 CLINICAL DATA:  Mental status change.  History of dementia. EXAM: CT HEAD WITHOUT CONTRAST TECHNIQUE: Contiguous axial images were obtained from the base of the skull through the vertex without intravenous contrast. COMPARISON:  Head CT 01/11/2015 FINDINGS: Brain: Stable age related cerebral atrophy, ventriculomegaly and periventricular white matter disease. No extra-axial fluid collections are identified. No CT findings for acute hemispheric infarction or intracranial hemorrhage. Remote left frontal infarct. No mass lesions. The brainstem and cerebellum are normal. Vascular: No hyperdense vessel or unexpected calcification. Skull: No skull fracture or bone lesions. Sinuses/Orbits: The paranasal sinuses and mastoid air cells are clear. The globes are intact. Other: No scalp lesions or hematoma. IMPRESSION: 1. Overall stable age related cerebral atrophy, ventriculomegaly and periventricular white matter disease. 2. No new/acute intracranial findings or mass lesions. Electronically Signed   By: Marijo Sanes M.D.   On: 11/18/2017 12:10      ____________________________________________   PROCEDURES  Procedure(s) performed: None Procedures Critical Care performed:  None ____________________________________________   INITIAL IMPRESSION / ASSESSMENT AND PLAN / ED COURSE   84 y.o. male with a history of dementia, cirrhosis of the liver, normal  pressure hydrocephalus, CHF, and hypertension who presents for evaluation of syncopal event.  Based on history differential diagnoses including vasovagal versus cardiac dysrhythmia versus stroke versus dehydration.  Will interrogate pacemaker, check labs for electrolyte abnormalities, AKI, or severe anemia.  EKG with no evidence of dysrhythmias or ischemia.  Will monitor patient on telemetry.  Will check UA to rule out UTI.  Patient does have asymmetric pupils but otherwise is completely neurologically intact.  This could be due to anisocoria but will rule out a possible stroke.    _________________________ 12:38 PM on 11/18/2017 -----------------------------------------  CT head negative. Labs showing AKI. Will give IVF and admit for high risk syncope and AKI.    As part of my medical decision making, I  reviewed the following data within the [place] History obtained from family, Nursing notes reviewed and incorporated, Labs reviewed , EKG interpreted , Old EKG reviewed, Old chart reviewed, Radiograph reviewed , Discussed with admitting physician , Notes from prior ED visits and [place] Controlled Substance Database    Pertinent labs & imaging results that were available during my care of the patient were reviewed by me and considered in my medical decision making (see chart for details).    ____________________________________________   FINAL CLINICAL IMPRESSION(S) / ED DIAGNOSES  Final diagnoses:  Syncope, unspecified syncope type  AKI (acute kidney injury) ([place])      NEW MEDICATIONS STARTED DURING THIS VISIT:  ED Discharge Orders    None       Note:  This document was prepared using Dragon voice recognition software and may include unintentional dictation errors.    Rudene Re, MD 11/18/17 1239

## 2017-11-18 NOTE — Care Management Obs Status (Signed)
[place] NOTIFICATION   Patient Details  Name: Stephen Roth. MRN: [id number] Date of Birth: 06-30-1925   Medicare Observation Status Notification Given:  Yes  Daughter is HCPOA and signed form for patient.     Shelbie Hutching, RN 11/18/2017, 2:09 PM

## 2017-11-18 NOTE — Progress Notes (Signed)
PHARMACIST - PHYSICIAN COMMUNICATION  CONCERNING:  Enoxaparin (Lovenox) for DVT Prophylaxis    RECOMMENDATION: Patient was prescribed enoxaprin 30mg  q24 hours for VTE prophylaxis.   Filed Weights   11/18/17 1125  Weight: 154 lb 5.2 oz (70 kg)    Body mass index is 22.14 kg/m.  Estimated Creatinine Clearance: 37.1 mL/min (A) (by C-G formula based on SCr of 1.31 mg/dL (H)).   Patient is candidate for enoxaparin 40mg  every 24 hours based on CrCl >5ml/min   DESCRIPTION: Pharmacy has adjusted enoxaparin dose per [facility] policy.  Patient is now receiving enoxaparin 40mg  every 24 hours.    Lu Duffel, PharmD Clinical Pharmacist 11/18/2017 5:26 PM

## 2017-11-18 NOTE — ED Triage Notes (Signed)
pt presents from [facility] clinic with c/o unresponsiveness in wheelchair while being seen at clinic. Nurse reports that pt went unresponsiveness for about 2 minutes in his wheelchair while she was talking to him. Lives at skilled nursing faciliy, hx of dementia. Upon arrival to ED, pt alert and talking.

## 2017-11-18 NOTE — Care Management Obs Status (Signed)
[place] NOTIFICATION   Patient Details  Name: Stephen Roth. MRN: [id number] Date of Birth: 1925-02-06   Medicare Observation Status Notification Given:  Yes    Shelbie Hutching, RN 11/18/2017, 2:14 PM

## 2017-11-18 NOTE — ED Notes (Signed)
Pt lives at [facility].

## 2017-11-18 NOTE — Clinical Social Work Note (Signed)
CSW spoke to patient's daughter [name] [phone] to confirm which section patient is in at [facility].  Patient's daughter stated he is in long term care at [facility] and plan is to return back to [facility] once he is medically ready for discharge.  CSW was also informed by daughter that patient's family is paying for a bed hold for patient to return.  Formal assessment to follow.  Evette Cristal, MSW, [facility] ED Covering CSW [phone] 11/18/2017 5:10 PM

## 2017-11-18 NOTE — NC FL2 (Addendum)
[place] LEVEL OF CARE SCREENING TOOL     IDENTIFICATION  Patient Name: Stephen Roth. Birthdate: [birth year] Sex: male Admission Date (Current Location): 11/18/2017  [place] and [place] Number:  Selena Lesser [phone] [id number] Facility and Address:  [facility], [address], [place], [place] [address]      Provider Number: [id number]  Attending Physician Name and Address:  Fritzi Mandes, MD  Relative Name and Phone Number:  [facility] Daughter [phone] or [place], [place] Spouse [phone]  [phone] or [place], [place] [phone]     Current Level of Care: SNF Recommended Level of Care: [place] Prior Approval Number:    Date Approved/Denied:   PASRR Number: [id number]  Discharge Plan: SNF    Current Diagnoses: Patient Active Problem List   Diagnosis Date Noted  . Syncope 11/18/2017  . Femur fracture, left ([place]) 08/30/2016  . Hip fracture ([place]) 01/20/2016  . GI bleed 08/10/2015    Orientation RESPIRATION BLADDER Height & Weight     Self, Situation, Place  Normal Incontinent Weight: 154 lb 5.2 oz (70 kg) Height:  5\' 10"  (177.8 cm)  BEHAVIORAL SYMPTOMS/MOOD NEUROLOGICAL BOWEL NUTRITION STATUS      Continent Diet(2 gram sodium diet)  AMBULATORY STATUS COMMUNICATION OF NEEDS Skin   Limited Assist Verbally Normal                       Personal Care Assistance Level of Assistance  Bathing, Dressing Bathing Assistance: Limited assistance Feeding assistance: Limited assistance Dressing Assistance: Limited assistance     Functional Limitations Info  Speech, Hearing   Hearing Info: Adequate Speech Info: Adequate    SPECIAL CARE FACTORS FREQUENCY                      Contractures Contractures Info: Not present    Additional Factors Info  Code Status, Allergies Code Status Info: DNR Allergies Info: CEPHALEXIN, CLINDAMYCIN/LINCOMYCIN, MORPHINE AND RELATED           Current  Medications (11/18/2017):  This is the current hospital active medication list Current Facility-Administered Medications  Medication Dose Route Frequency Provider Last Rate Last Dose  . 0.45 % sodium chloride infusion   Intravenous Continuous Hillary Bow, MD 75 mL/hr at 11/18/17 1423    . acetaminophen (TYLENOL) tablet 650 mg  650 mg Oral Q6H PRN Hillary Bow, MD       Or  . acetaminophen (TYLENOL) suppository 650 mg  650 mg Rectal Q6H PRN Sudini, Srikar, MD      . albuterol (PROVENTIL) (2.5 MG/3ML) 0.083% nebulizer solution 2.5 mg  2.5 mg Nebulization Q2H PRN Hillary Bow, MD      . Derrill Memo ON 11/19/2017] enoxaparin (LOVENOX) injection 40 mg  40 mg Subcutaneous Q24H Shanlever, Charles M, RPH      . ondansetron (ZOFRAN) tablet 4 mg  4 mg Oral Q6H PRN Hillary Bow, MD       Or  . ondansetron (ZOFRAN) injection 4 mg  4 mg Intravenous Q6H PRN Sudini, Srikar, MD      . polyethylene glycol (MIRALAX / GLYCOLAX) packet 17 g  17 g Oral Daily PRN Sudini, Srikar, MD      . sodium chloride flush (NS) 0.9 % injection 3 mL  3 mL Intravenous Q12H Hillary Bow, MD   3 mL at 11/18/17 1411   Current Outpatient Medications  Medication Sig Dispense Refill  . acetaminophen (TYLENOL) 325 MG tablet Take 650 mg by mouth every  4 (four) hours as needed for mild pain or moderate pain.     Marland Kitchen aspirin 81 MG chewable tablet Chew 81 mg by mouth daily.    . bisacodyl (DULCOLAX) 10 MG suppository Place 1 suppository (10 mg total) rectally daily as needed for moderate constipation. 12 suppository 0  . Cholecalciferol 1.25 MG (50000 UT) capsule Take 50,000 Units by mouth every 30 (thirty) days.    Marland Kitchen dutasteride (AVODART) 0.5 MG capsule Take 0.5 mg by mouth daily.    Marland Kitchen erythromycin ophthalmic ointment Place 1 application into the left eye 2 (two) times daily.    . furosemide (LASIX) 40 MG tablet Take 40 mg by mouth daily. Hold if AM weight <= 160lb    . memantine (NAMENDA) 10 MG tablet Take 10 mg by mouth 2 (two) times  daily.    . Misc Natural Products (LUTEIN 20 PO) Take 1 capsule by mouth daily.     Marland Kitchen nystatin (NYSTATIN) powder Apply 1 g topically 2 (two) times daily. Apply to groin and testicles    . spironolactone (ALDACTONE) 25 MG tablet Take 25 mg by mouth daily.    Marland Kitchen enoxaparin (LOVENOX) 30 MG/0.3ML injection Inject 0.3 mLs (30 mg total) into the skin daily. 14 Syringe 0  . HYDROcodone-acetaminophen (NORCO/VICODIN) 5-325 MG tablet Take 1 tablet by mouth every 4 (four) hours as needed for moderate pain (breakthrough pain). 30 tablet 0  . lamoTRIgine (LAMICTAL) 25 MG tablet Take 2 tablets (50 mg total) by mouth at bedtime. (Patient taking differently: Take 25 mg by mouth at bedtime. ) 30 tablet 0  . senna-docusate (SENOKOT-S) 8.6-50 MG tablet Take 1 tablet by mouth at bedtime as needed for mild constipation. 20 tablet 0     Discharge Medications: Please see discharge summary for a list of discharge medications.  Relevant Imaging Results:  Relevant Lab Results:   Additional Information SS# [id number]  Anell Barr

## 2017-11-18 NOTE — ED Notes (Signed)
CBG 89. MD notified

## 2017-11-18 NOTE — ED Notes (Signed)
Pt changed and placed into new brief at this time. Pt noted to have urinated at this time.

## 2017-11-18 NOTE — ED Notes (Signed)
Stephen Roth, Daughter of pt,  phone number is [phone]. Daughter requesting to be contacted with any updates while she is not here.

## 2017-11-19 DIAGNOSIS — R55 Syncope and collapse: Secondary | ICD-10-CM | POA: Diagnosis not present

## 2017-11-19 LAB — BASIC METABOLIC PANEL
ANION GAP: 5 (ref 5–15)
BUN: 24 mg/dL — ABNORMAL HIGH (ref 8–23)
CALCIUM: 8.9 mg/dL (ref 8.9–10.3)
CO2: 29 mmol/L (ref 22–32)
Chloride: 106 mmol/L (ref 98–111)
Creatinine, Ser: 0.89 mg/dL (ref 0.61–1.24)
Glucose, Bld: 87 mg/dL (ref 70–99)
Potassium: 3.9 mmol/L (ref 3.5–5.1)
Sodium: 140 mmol/L (ref 135–145)

## 2017-11-19 LAB — ECHOCARDIOGRAM COMPLETE
HEIGHTINCHES: 70 in
Weight: 2469.15 oz

## 2017-11-19 MED ORDER — FUROSEMIDE 40 MG PO TABS: 40.0000 mg | ORAL_TABLET | Freq: Every day | ORAL | 0 refills | Status: DC

## 2017-11-19 MED ORDER — LAMOTRIGINE 25 MG PO TABS: 25.0000 mg | ORAL_TABLET | Freq: Every day | ORAL | 0 refills | Status: DC

## 2017-11-19 NOTE — Discharge Summary (Signed)
[facility] [place] NAME: Stephen Roth    MR#:  [id number]  DATE OF BIRTH:  June 25, 1925  DATE OF ADMISSION:  11/18/2017 ADMITTING PHYSICIAN: Hillary Bow, MD  DATE OF DISCHARGE: 11/19/2017  PRIMARY CARE PHYSICIAN: Adin Hector, MD    ADMISSION DIAGNOSIS:  AKI (acute kidney injury) ([place]) [N17.9] Syncope, unspecified syncope type [R55]  DISCHARGE DIAGNOSIS:  syncope suspected due to acute renal failure acute renal failure secondary to dehydration from poor PO intake resolved  SECONDARY DIAGNOSIS:   Past Medical History:  Diagnosis Date  . Cancer ([place])    basal cell on eyelid  . CHF (congestive heart failure) ([place])   . Cirrhosis ([place])   . Hypertension   . Normal pressure hydrocephalus (HCC)   . Sleep apnea     HOSPITAL COURSE:   Stephen Roth  is a 84 y.o. male with a known history of dementia, normal pressure hydrocephalus, cirrhosis, chronic systolic CHF, pacemaker present to the emergency room from his cardiologist Dr. Bethanne Ginger office after having a syncope in wheelchair.  Patient does not ambulate and is wheelchair-bound.  Was sitting in a wheelchair he had an episode where he passed out for 2 to 3 minutes.  And return to his normal.   Here pacemaker interrogated with no significant abnormalities.  Troponin normal.  Found to have acute kidney injury with creatinine 1.3 with baseline of 0.7  * Syncope.  Likely due to dehydration.  -  Pacemaker interrogated and no arrhythmias.   -pt remains in NSR -ate good breakfast -back to baseline. Blood pressure stable. -Denies any chest pain shortness of breath dizziness or nausea  * AKI due to decrease PO intake received IVF came in with creatinine of 1.3-- 0.8 Appears well hydrated now  * Chronic systolic chf No signs of fluid overload. Hold lasix for today and resume from tomorrow.  Discussed at length with patient and patient's daughter in the room. Patient is  eager to go back to [facility]. Daughter agreeable patient is wheelchair-bound and daughter is okay with patient not getting physical therapy. According to her he pivots pretty well.  Will discharged to [facility] today.  CONSULTS OBTAINED:    DRUG ALLERGIES:   Allergies  Allergen Reactions  . Cephalexin Other (See Comments)    Confusion, hives, hallucinatons  . Clindamycin/Lincomycin Other (See Comments)    Confusion, hives, hallucinations  . Morphine And Related Other (See Comments)    DISCHARGE MEDICATIONS:   Allergies as of 11/19/2017      Reactions   Cephalexin Other (See Comments)   Confusion, hives, hallucinatons   Clindamycin/lincomycin Other (See Comments)   Confusion, hives, hallucinations   Morphine And Related Other (See Comments)      Medication List    STOP taking these medications   enoxaparin 30 MG/0.3ML injection Commonly known as:  LOVENOX     TAKE these medications   acetaminophen 325 MG tablet Commonly known as:  TYLENOL Take 650 mg by mouth every 4 (four) hours as needed for mild pain or moderate pain.   aspirin 81 MG chewable tablet Chew 81 mg by mouth daily.   bisacodyl 10 MG suppository Commonly known as:  DULCOLAX Place 1 suppository (10 mg total) rectally daily as needed for moderate constipation.   Cholecalciferol 1.25 MG (50000 UT) capsule Take 50,000 Units by mouth every 30 (thirty) days.   dutasteride 0.5 MG capsule Commonly known as:  AVODART Take 0.5 mg by  mouth daily.   erythromycin ophthalmic ointment Place 1 application into the left eye 2 (two) times daily.   furosemide 40 MG tablet Commonly known as:  LASIX Take 1 tablet (40 mg total) by mouth daily. Hold if AM weight <= 160lb Start taking on:  11/20/2017   HYDROcodone-acetaminophen 5-325 MG tablet Commonly known as:  NORCO/VICODIN Take 1 tablet by mouth every 4 (four) hours as needed for moderate pain (breakthrough pain).   lamoTRIgine 25 MG tablet Commonly known  as:  LAMICTAL Take 1 tablet (25 mg total) by mouth at bedtime.   LUTEIN 20 PO Take 1 capsule by mouth daily.   memantine 10 MG tablet Commonly known as:  NAMENDA Take 10 mg by mouth 2 (two) times daily.   nystatin powder Generic drug:  nystatin Apply 1 g topically 2 (two) times daily. Apply to groin and testicles   senna-docusate 8.6-50 MG tablet Commonly known as:  Senokot-S Take 1 tablet by mouth at bedtime as needed for mild constipation.   spironolactone 25 MG tablet Commonly known as:  ALDACTONE Take 25 mg by mouth daily.       If you experience worsening of your admission symptoms, develop shortness of breath, life threatening emergency, suicidal or homicidal thoughts you must seek medical attention immediately by calling 911 or calling your MD immediately  if symptoms less severe.  You Must read complete instructions/literature along with all the possible adverse reactions/side effects for all the Medicines you take and that have been prescribed to you. Take any new Medicines after you have completely understood and accept all the possible adverse reactions/side effects.   Please note  You were cared for by a hospitalist during your hospital stay. If you have any questions about your discharge medications or the care you received while you were in the hospital after you are discharged, you can call the unit and asked to speak with the hospitalist on call if the hospitalist that took care of you is not available. Once you are discharged, your primary care physician will handle any further medical issues. Please note that NO REFILLS for any discharge medications will be authorized once you are discharged, as it is imperative that you return to your primary care physician (or establish a relationship with a primary care physician if you do not have one) for your aftercare needs so that they can reassess your need for medications and monitor your lab values. Today   SUBJECTIVE    Doing well  VITAL SIGNS:  Blood pressure 128/73, pulse 75, temperature 98.4 F (36.9 C), temperature source Oral, resp. rate 18, height 5\' 10"  (1.778 m), weight 79.1 kg, SpO2 98 %.  I/O:    Intake/Output Summary (Last 24 hours) at 11/19/2017 1114 Last data filed at 11/19/2017 0900 Gross per 24 hour  Intake 528.27 ml  Output -  Net 528.27 ml    PHYSICAL EXAMINATION:  GENERAL:  83 y.o.-year-old patient lying in the bed with no acute distress.  EYES: Pupils equal, round, reactive to light and accommodation. No scleral icterus. Extraocular muscles intact.  HEENT: Head atraumatic, normocephalic. Oropharynx and nasopharynx clear.  NECK:  Supple, no jugular venous distention. No thyroid enlargement, no tenderness.  LUNGS: Normal breath sounds bilaterally, no wheezing, rales,rhonchi or crepitation. No use of accessory muscles of respiration.  CARDIOVASCULAR: S1, S2 normal. No murmurs, rubs, or gallops.  ABDOMEN: Soft, non-tender, non-distended. Bowel sounds present. No organomegaly or mass.  EXTREMITIES: No pedal edema, cyanosis, or clubbing.  NEUROLOGIC: Cranial  nerves II through XII are intact. Muscle strength 5/5 in all extremities. Sensation intact. Gait not checked. No focal weakness PSYCHIATRIC: The patient is alert and oriented x 3.  SKIN: No obvious rash, lesion, or ulcer.dry skin   DATA REVIEW:   CBC  Recent Labs  Lab 11/18/17 1137  WBC 8.6  HGB 15.6  HCT 46.7  PLT 290    Chemistries  Recent Labs  Lab 11/18/17 1137 11/19/17 0322  NA 140 140  K 4.1 3.9  CL 103 106  CO2 28 29  GLUCOSE 108* 87  BUN 24* 24*  CREATININE 1.31* 0.89  CALCIUM 9.7 8.9  AST 27  --   ALT 20  --   ALKPHOS 78  --   BILITOT 1.0  --     Microbiology Results   No results found for this or any previous visit (from the past 240 hour(s)).  RADIOLOGY:  Dg Chest 2 View  Result Date: 11/18/2017 CLINICAL DATA:  The patient was unresponsive for approximately 2 minutes today. EXAM: CHEST  - 2 VIEW COMPARISON:  Single-view of the chest 01/20/2016. CT chest, abdomen and pelvis 12/31/2012. FINDINGS: The lungs are clear. Heart size is mildly enlarged with an AICD in place. No pneumothorax or pleural effusion. No acute or focal bony abnormality. IMPRESSION: No acute disease. Cardiomegaly. Atherosclerosis. Electronically Signed   By: Inge Rise M.D.   On: 11/18/2017 12:31   Ct Head Wo Contrast  Result Date: 11/18/2017 CLINICAL DATA:  Mental status change.  History of dementia. EXAM: CT HEAD WITHOUT CONTRAST TECHNIQUE: Contiguous axial images were obtained from the base of the skull through the vertex without intravenous contrast. COMPARISON:  Head CT 01/11/2015 FINDINGS: Brain: Stable age related cerebral atrophy, ventriculomegaly and periventricular white matter disease. No extra-axial fluid collections are identified. No CT findings for acute hemispheric infarction or intracranial hemorrhage. Remote left frontal infarct. No mass lesions. The brainstem and cerebellum are normal. Vascular: No hyperdense vessel or unexpected calcification. Skull: No skull fracture or bone lesions. Sinuses/Orbits: The paranasal sinuses and mastoid air cells are clear. The globes are intact. Other: No scalp lesions or hematoma. IMPRESSION: 1. Overall stable age related cerebral atrophy, ventriculomegaly and periventricular white matter disease. 2. No new/acute intracranial findings or mass lesions. Electronically Signed   By: Marijo Sanes M.D.   On: 11/18/2017 12:10     Management plans discussed with the patient, family and they are in agreement.  CODE STATUS:     Code Status Orders  (From admission, onward)         Start     Ordered   11/18/17 1325  Do not attempt resuscitation (DNR)  Continuous    Question Answer Comment  In the event of cardiac or respiratory ARREST Do not call a "code blue"   In the event of cardiac or respiratory ARREST Do not perform Intubation, CPR, defibrillation or ACLS    In the event of cardiac or respiratory ARREST Use medication by any route, position, wound care, and other measures to relive pain and suffering. May use oxygen, suction and manual treatment of airway obstruction as needed for comfort.      11/18/17 1325        Code Status History    Date Active Date Inactive Code Status Order ID Comments User Context   08/30/2016 0336 09/03/2016 1410 DNR [id number]  Saundra Shelling, MD ED   01/20/2016 0715 01/23/2016 2003 Full Code [id number]  Saundra Shelling, MD Inpatient   08/10/2015 2117  08/11/2015 1539 Full Code [id number]  Henreitta Leber, MD Inpatient      TOTAL TIME TAKING CARE OF THIS PATIENT: *40* minutes.    Fritzi Mandes M.D on 11/19/2017 at 11:14 AM  Between 7am to 6pm - Pager - [phone] After 6pm go to [email or web address] - password [email or web address] [place] [facility]  Office  [phone]  CC: Primary care physician; Adin Hector, MD

## 2017-11-19 NOTE — Discharge Instructions (Signed)
Keep yourself hydrated with oral fluids

## 2017-11-20 DIAGNOSIS — R55 Syncope and collapse: Secondary | ICD-10-CM

## 2017-11-20 DIAGNOSIS — G40909 Epilepsy, unspecified, not intractable, without status epilepticus: Secondary | ICD-10-CM

## 2017-11-20 DIAGNOSIS — N179 Acute kidney failure, unspecified: Secondary | ICD-10-CM | POA: Diagnosis not present

## 2017-12-01 DIAGNOSIS — K089 Disorder of teeth and supporting structures, unspecified: Secondary | ICD-10-CM | POA: Diagnosis not present

## 2018-01-01 DIAGNOSIS — G309 Alzheimer's disease, unspecified: Secondary | ICD-10-CM | POA: Diagnosis not present

## 2018-01-01 DIAGNOSIS — K7460 Unspecified cirrhosis of liver: Secondary | ICD-10-CM

## 2018-01-01 DIAGNOSIS — Z8679 Personal history of other diseases of the circulatory system: Secondary | ICD-10-CM

## 2018-01-01 DIAGNOSIS — F39 Unspecified mood [affective] disorder: Secondary | ICD-10-CM | POA: Diagnosis not present

## 2018-01-01 DIAGNOSIS — G912 (Idiopathic) normal pressure hydrocephalus: Secondary | ICD-10-CM

## 2018-01-01 DIAGNOSIS — I5020 Unspecified systolic (congestive) heart failure: Secondary | ICD-10-CM

## 2018-01-01 DIAGNOSIS — N401 Enlarged prostate with lower urinary tract symptoms: Secondary | ICD-10-CM | POA: Diagnosis not present

## 2018-01-01 DIAGNOSIS — N183 Chronic kidney disease, stage 3 (moderate): Secondary | ICD-10-CM

## 2018-03-08 DIAGNOSIS — Z8679 Personal history of other diseases of the circulatory system: Secondary | ICD-10-CM

## 2018-03-08 DIAGNOSIS — N183 Chronic kidney disease, stage 3 (moderate): Secondary | ICD-10-CM

## 2018-03-08 DIAGNOSIS — G912 (Idiopathic) normal pressure hydrocephalus: Secondary | ICD-10-CM | POA: Diagnosis not present

## 2018-03-08 DIAGNOSIS — F39 Unspecified mood [affective] disorder: Secondary | ICD-10-CM | POA: Diagnosis not present

## 2018-03-08 DIAGNOSIS — K7460 Unspecified cirrhosis of liver: Secondary | ICD-10-CM

## 2018-03-08 DIAGNOSIS — I5022 Chronic systolic (congestive) heart failure: Secondary | ICD-10-CM | POA: Diagnosis not present

## 2018-04-26 DIAGNOSIS — I872 Venous insufficiency (chronic) (peripheral): Secondary | ICD-10-CM

## 2018-05-03 DIAGNOSIS — L97909 Non-pressure chronic ulcer of unspecified part of unspecified lower leg with unspecified severity: Secondary | ICD-10-CM

## 2018-05-05 DIAGNOSIS — L97919 Non-pressure chronic ulcer of unspecified part of right lower leg with unspecified severity: Secondary | ICD-10-CM

## 2018-05-05 DIAGNOSIS — F39 Unspecified mood [affective] disorder: Secondary | ICD-10-CM | POA: Diagnosis not present

## 2018-05-05 DIAGNOSIS — N401 Enlarged prostate with lower urinary tract symptoms: Secondary | ICD-10-CM | POA: Diagnosis not present

## 2018-05-05 DIAGNOSIS — N183 Chronic kidney disease, stage 3 (moderate): Secondary | ICD-10-CM

## 2018-05-05 DIAGNOSIS — K7460 Unspecified cirrhosis of liver: Secondary | ICD-10-CM

## 2018-05-05 DIAGNOSIS — I5020 Unspecified systolic (congestive) heart failure: Secondary | ICD-10-CM

## 2018-05-05 DIAGNOSIS — I472 Ventricular tachycardia: Secondary | ICD-10-CM

## 2018-05-05 DIAGNOSIS — G912 (Idiopathic) normal pressure hydrocephalus: Secondary | ICD-10-CM

## 2018-06-14 DIAGNOSIS — K0889 Other specified disorders of teeth and supporting structures: Secondary | ICD-10-CM

## 2018-07-02 DIAGNOSIS — B351 Tinea unguium: Secondary | ICD-10-CM

## 2018-07-08 DIAGNOSIS — I5022 Chronic systolic (congestive) heart failure: Secondary | ICD-10-CM

## 2018-07-08 DIAGNOSIS — G912 (Idiopathic) normal pressure hydrocephalus: Secondary | ICD-10-CM

## 2018-07-08 DIAGNOSIS — N183 Chronic kidney disease, stage 3 (moderate): Secondary | ICD-10-CM

## 2018-07-08 DIAGNOSIS — F39 Unspecified mood [affective] disorder: Secondary | ICD-10-CM | POA: Diagnosis not present

## 2018-07-09 DIAGNOSIS — L03116 Cellulitis of left lower limb: Secondary | ICD-10-CM | POA: Diagnosis not present

## 2018-07-09 DIAGNOSIS — I872 Venous insufficiency (chronic) (peripheral): Secondary | ICD-10-CM | POA: Diagnosis not present

## 2018-09-08 DIAGNOSIS — K7460 Unspecified cirrhosis of liver: Secondary | ICD-10-CM

## 2018-09-08 DIAGNOSIS — H1032 Unspecified acute conjunctivitis, left eye: Secondary | ICD-10-CM

## 2018-09-08 DIAGNOSIS — Z8679 Personal history of other diseases of the circulatory system: Secondary | ICD-10-CM | POA: Diagnosis not present

## 2018-09-08 DIAGNOSIS — N401 Enlarged prostate with lower urinary tract symptoms: Secondary | ICD-10-CM

## 2018-09-08 DIAGNOSIS — I87319 Chronic venous hypertension (idiopathic) with ulcer of unspecified lower extremity: Secondary | ICD-10-CM

## 2018-09-08 DIAGNOSIS — F39 Unspecified mood [affective] disorder: Secondary | ICD-10-CM | POA: Diagnosis not present

## 2018-09-08 DIAGNOSIS — I5020 Unspecified systolic (congestive) heart failure: Secondary | ICD-10-CM | POA: Diagnosis not present

## 2018-09-08 DIAGNOSIS — N183 Chronic kidney disease, stage 3 (moderate): Secondary | ICD-10-CM

## 2018-09-08 DIAGNOSIS — G912 (Idiopathic) normal pressure hydrocephalus: Secondary | ICD-10-CM

## 2018-09-14 DIAGNOSIS — B353 Tinea pedis: Secondary | ICD-10-CM | POA: Diagnosis not present

## 2018-09-17 DIAGNOSIS — B353 Tinea pedis: Secondary | ICD-10-CM

## 2018-09-17 DIAGNOSIS — L03116 Cellulitis of left lower limb: Secondary | ICD-10-CM

## 2018-09-17 DIAGNOSIS — I872 Venous insufficiency (chronic) (peripheral): Secondary | ICD-10-CM

## 2018-09-21 DIAGNOSIS — L03116 Cellulitis of left lower limb: Secondary | ICD-10-CM

## 2018-10-04 DIAGNOSIS — H1033 Unspecified acute conjunctivitis, bilateral: Secondary | ICD-10-CM | POA: Diagnosis not present

## 2018-11-09 DIAGNOSIS — G912 (Idiopathic) normal pressure hydrocephalus: Secondary | ICD-10-CM | POA: Diagnosis not present

## 2018-11-09 DIAGNOSIS — U071 COVID-19: Secondary | ICD-10-CM

## 2018-11-09 DIAGNOSIS — F39 Unspecified mood [affective] disorder: Secondary | ICD-10-CM | POA: Diagnosis not present

## 2018-11-09 DIAGNOSIS — I5022 Chronic systolic (congestive) heart failure: Secondary | ICD-10-CM | POA: Diagnosis not present

## 2018-11-09 DIAGNOSIS — K7460 Unspecified cirrhosis of liver: Secondary | ICD-10-CM | POA: Diagnosis not present

## 2018-11-09 DIAGNOSIS — N400 Enlarged prostate without lower urinary tract symptoms: Secondary | ICD-10-CM

## 2018-11-12 ENCOUNTER — Emergency Department: Payer: Medicare Other

## 2018-11-12 ENCOUNTER — Emergency Department
Admission: EM | Admit: 2018-11-12 | Discharge: 2018-11-12 | Disposition: A | Discharge status: Other Acute Inpatient Hospital | Payer: Medicare Other | Attending: Emergency Medicine | Admitting: Emergency Medicine

## 2018-11-12 ENCOUNTER — Other Ambulatory Visit: Payer: Self-pay

## 2018-11-12 ENCOUNTER — Inpatient Hospital Stay (HOSPITAL_COMMUNITY)
Admission: AD | Admit: 2018-11-12 | Discharge: 2018-11-26 | DRG: 177 | Disposition: A | Discharge status: Hospice | Payer: Medicare Other | Source: Other Acute Inpatient Hospital | Attending: Internal Medicine | Admitting: Internal Medicine

## 2018-11-12 DIAGNOSIS — Z515 Encounter for palliative care: Secondary | ICD-10-CM | POA: Diagnosis present

## 2018-11-12 DIAGNOSIS — Z96653 Presence of artificial knee joint, bilateral: Secondary | ICD-10-CM | POA: Diagnosis not present

## 2018-11-12 DIAGNOSIS — Z950 Presence of cardiac pacemaker: Secondary | ICD-10-CM | POA: Insufficient documentation

## 2018-11-12 DIAGNOSIS — G9341 Metabolic encephalopathy: Secondary | ICD-10-CM | POA: Diagnosis present

## 2018-11-12 DIAGNOSIS — K7460 Unspecified cirrhosis of liver: Secondary | ICD-10-CM | POA: Diagnosis present

## 2018-11-12 DIAGNOSIS — R1310 Dysphagia, unspecified: Secondary | ICD-10-CM | POA: Diagnosis present

## 2018-11-12 DIAGNOSIS — I482 Chronic atrial fibrillation, unspecified: Secondary | ICD-10-CM | POA: Diagnosis present

## 2018-11-12 DIAGNOSIS — I480 Paroxysmal atrial fibrillation: Secondary | ICD-10-CM | POA: Insufficient documentation

## 2018-11-12 DIAGNOSIS — L89629 Pressure ulcer of left heel, unspecified stage: Secondary | ICD-10-CM | POA: Diagnosis present

## 2018-11-12 DIAGNOSIS — G9340 Encephalopathy, unspecified: Principal | ICD-10-CM | POA: Diagnosis present

## 2018-11-12 DIAGNOSIS — I509 Heart failure, unspecified: Secondary | ICD-10-CM | POA: Diagnosis not present

## 2018-11-12 DIAGNOSIS — Z79899 Other long term (current) drug therapy: Secondary | ICD-10-CM

## 2018-11-12 DIAGNOSIS — N390 Urinary tract infection, site not specified: Secondary | ICD-10-CM | POA: Diagnosis present

## 2018-11-12 DIAGNOSIS — U071 COVID-19: Secondary | ICD-10-CM | POA: Diagnosis present

## 2018-11-12 DIAGNOSIS — I5042 Chronic combined systolic (congestive) and diastolic (congestive) heart failure: Secondary | ICD-10-CM | POA: Diagnosis present

## 2018-11-12 DIAGNOSIS — N179 Acute kidney failure, unspecified: Secondary | ICD-10-CM | POA: Diagnosis present

## 2018-11-12 DIAGNOSIS — J9601 Acute respiratory failure with hypoxia: Secondary | ICD-10-CM | POA: Diagnosis present

## 2018-11-12 DIAGNOSIS — Z7982 Long term (current) use of aspirin: Secondary | ICD-10-CM | POA: Insufficient documentation

## 2018-11-12 DIAGNOSIS — Z95810 Presence of automatic (implantable) cardiac defibrillator: Secondary | ICD-10-CM

## 2018-11-12 DIAGNOSIS — Z66 Do not resuscitate: Secondary | ICD-10-CM | POA: Diagnosis present

## 2018-11-12 DIAGNOSIS — R4182 Altered mental status, unspecified: Secondary | ICD-10-CM | POA: Diagnosis present

## 2018-11-12 DIAGNOSIS — I4891 Unspecified atrial fibrillation: Secondary | ICD-10-CM | POA: Diagnosis not present

## 2018-11-12 DIAGNOSIS — R627 Adult failure to thrive: Secondary | ICD-10-CM | POA: Diagnosis present

## 2018-11-12 DIAGNOSIS — E870 Hyperosmolality and hypernatremia: Secondary | ICD-10-CM | POA: Diagnosis present

## 2018-11-12 DIAGNOSIS — J1289 Other viral pneumonia: Secondary | ICD-10-CM | POA: Diagnosis present

## 2018-11-12 DIAGNOSIS — F0390 Unspecified dementia without behavioral disturbance: Secondary | ICD-10-CM | POA: Diagnosis present

## 2018-11-12 DIAGNOSIS — E860 Dehydration: Secondary | ICD-10-CM | POA: Diagnosis present

## 2018-11-12 DIAGNOSIS — E876 Hypokalemia: Secondary | ICD-10-CM | POA: Diagnosis present

## 2018-11-12 DIAGNOSIS — L89621 Pressure ulcer of left heel, stage 1: Secondary | ICD-10-CM | POA: Diagnosis present

## 2018-11-12 DIAGNOSIS — R0902 Hypoxemia: Secondary | ICD-10-CM

## 2018-11-12 DIAGNOSIS — B952 Enterococcus as the cause of diseases classified elsewhere: Secondary | ICD-10-CM | POA: Diagnosis present

## 2018-11-12 DIAGNOSIS — B964 Proteus (mirabilis) (morganii) as the cause of diseases classified elsewhere: Secondary | ICD-10-CM | POA: Diagnosis present

## 2018-11-12 DIAGNOSIS — I110 Hypertensive heart disease with heart failure: Secondary | ICD-10-CM | POA: Diagnosis not present

## 2018-11-12 DIAGNOSIS — E43 Unspecified severe protein-calorie malnutrition: Secondary | ICD-10-CM | POA: Diagnosis present

## 2018-11-12 DIAGNOSIS — Z96642 Presence of left artificial hip joint: Secondary | ICD-10-CM | POA: Diagnosis present

## 2018-11-12 DIAGNOSIS — T85598A Other mechanical complication of other gastrointestinal prosthetic devices, implants and grafts, initial encounter: Secondary | ICD-10-CM

## 2018-11-12 LAB — FIBRIN DERIVATIVES D-DIMER (ARMC ONLY): Fibrin derivatives D-dimer (ARMC): 1861.67 ng/mL (FEU) — ABNORMAL HIGH (ref 0.00–499.00)

## 2018-11-12 LAB — CBC WITH DIFFERENTIAL/PLATELET
Abs Immature Granulocytes: 0.04 10*3/uL (ref 0.00–0.07)
Basophils Absolute: 0 10*3/uL (ref 0.0–0.1)
Basophils Relative: 0 %
Eosinophils Absolute: 0 10*3/uL (ref 0.0–0.5)
Eosinophils Relative: 0 %
HCT: 60.2 % — ABNORMAL HIGH (ref 39.0–52.0)
Hemoglobin: 19.6 g/dL — ABNORMAL HIGH (ref 13.0–17.0)
Immature Granulocytes: 0 %
Lymphocytes Relative: 10 %
Lymphs Abs: 1 10*3/uL (ref 0.7–4.0)
MCH: 31.4 pg (ref 26.0–34.0)
MCHC: 32.6 g/dL (ref 30.0–36.0)
MCV: 96.3 fL (ref 80.0–100.0)
Monocytes Absolute: 0.9 10*3/uL (ref 0.1–1.0)
Monocytes Relative: 9 %
Neutro Abs: 7.9 10*3/uL — ABNORMAL HIGH (ref 1.7–7.7)
Neutrophils Relative %: 81 %
Platelets: 257 10*3/uL (ref 150–400)
RBC: 6.25 MIL/uL — ABNORMAL HIGH (ref 4.22–5.81)
RDW: 12.9 % (ref 11.5–15.5)
WBC: 9.9 10*3/uL (ref 4.0–10.5)
nRBC: 0 % (ref 0.0–0.2)

## 2018-11-12 LAB — COMPREHENSIVE METABOLIC PANEL
ALT: 37 U/L (ref 0–44)
AST: 71 U/L — ABNORMAL HIGH (ref 15–41)
Albumin: 3.7 g/dL (ref 3.5–5.0)
Alkaline Phosphatase: 84 U/L (ref 38–126)
Anion gap: 17 — ABNORMAL HIGH (ref 5–15)
BUN: 57 mg/dL — ABNORMAL HIGH (ref 8–23)
CO2: 28 mmol/L (ref 22–32)
Calcium: 9.9 mg/dL (ref 8.9–10.3)
Chloride: 109 mmol/L (ref 98–111)
Creatinine, Ser: 1.68 mg/dL — ABNORMAL HIGH (ref 0.61–1.24)
GFR calc Af Amer: 41 mL/min — ABNORMAL LOW (ref >60)
GFR calc non Af Amer: 35 mL/min — ABNORMAL LOW (ref >60)
Glucose, Bld: 140 mg/dL — ABNORMAL HIGH (ref 70–99)
Potassium: 3.9 mmol/L (ref 3.5–5.1)
Sodium: 154 mmol/L — ABNORMAL HIGH (ref 135–145)
Total Bilirubin: 1.7 mg/dL — ABNORMAL HIGH (ref 0.3–1.2)
Total Protein: 7.9 g/dL (ref 6.5–8.1)

## 2018-11-12 LAB — LACTATE DEHYDROGENASE: LDH: 275 U/L — ABNORMAL HIGH (ref 98–192)

## 2018-11-12 LAB — TRIGLYCERIDES: Triglycerides: 125 mg/dL (ref <150)

## 2018-11-12 LAB — BRAIN NATRIURETIC PEPTIDE: B Natriuretic Peptide: 121 pg/mL — ABNORMAL HIGH (ref 0.0–100.0)

## 2018-11-12 LAB — FERRITIN: Ferritin: 443 ng/mL — ABNORMAL HIGH (ref 24–336)

## 2018-11-12 LAB — PROCALCITONIN: Procalcitonin: 0.11 ng/mL

## 2018-11-12 LAB — LACTIC ACID, PLASMA: Lactic Acid, Venous: 3.1 mmol/L (ref 0.5–1.9)

## 2018-11-12 LAB — FIBRINOGEN: Fibrinogen: 639 mg/dL — ABNORMAL HIGH (ref 210–475)

## 2018-11-12 LAB — C-REACTIVE PROTEIN: CRP: 7.1 mg/dL — ABNORMAL HIGH (ref <1.0)

## 2018-11-12 MED ORDER — ONDANSETRON HCL 4 MG PO TABS: 4.0000 mg | ORAL_TABLET | Freq: Four times a day (QID) | ORAL | Status: DC | PRN

## 2018-11-12 MED ORDER — DILTIAZEM HCL 25 MG/5ML IV SOLN
10.0000 mg | Freq: Once | INTRAVENOUS | Status: AC
  Administered 2018-11-12: 15:00:00 10 mg via INTRAVENOUS
  Filled 2018-11-12: qty 5

## 2018-11-12 MED ORDER — DILTIAZEM HCL-DEXTROSE 125-5 MG/125ML-% IV SOLN (PREMIX)
5.0000 mg/h | INTRAVENOUS | Status: DC
  Administered 2018-11-13: 12.5 mg/h via INTRAVENOUS
  Administered 2018-11-13 – 2018-11-14 (×2): 7.5 mg/h via INTRAVENOUS
  Filled 2018-11-12 (×5): qty 125

## 2018-11-12 MED ORDER — DEXTROSE 5 % IV SOLN
INTRAVENOUS | Status: DC
  Administered 2018-11-12 – 2018-11-14 (×3): via INTRAVENOUS

## 2018-11-12 MED ORDER — SODIUM CHLORIDE 0.9 % IV SOLN
1000.0000 mL | Freq: Once | INTRAVENOUS | Status: AC
  Administered 2018-11-12: 1000 mL via INTRAVENOUS

## 2018-11-12 MED ORDER — DILTIAZEM HCL 100 MG IV SOLR
INTRAVENOUS | Status: AC
  Filled 2018-11-12: qty 100

## 2018-11-12 MED ORDER — ONDANSETRON HCL 4 MG/2ML IJ SOLN: 4.0000 mg | Freq: Four times a day (QID) | INTRAMUSCULAR | Status: DC | PRN

## 2018-11-12 MED ORDER — DILTIAZEM HCL 100 MG IV SOLR
5.0000 mg/h | INTRAVENOUS | Status: DC
  Administered 2018-11-12: 16:00:00 5 mg/h via INTRAVENOUS
  Filled 2018-11-12: qty 100

## 2018-11-12 MED ORDER — DEXAMETHASONE SODIUM PHOSPHATE 10 MG/ML IJ SOLN
8.0000 mg | Freq: Once | INTRAMUSCULAR | Status: AC
  Administered 2018-11-12: 8 mg via INTRAVENOUS
  Filled 2018-11-12: qty 1

## 2018-11-12 MED ORDER — DILTIAZEM HCL 100 MG IV SOLR: 5.0000 mg/h | INTRAVENOUS | Status: DC

## 2018-11-12 NOTE — ED Provider Notes (Signed)
[facility] Emergency Department Provider Note   ____________________________________________    I have reviewed the triage vital signs and the nursing notes.   HISTORY  Chief Complaint Altered Mental Status     HPI Stephen Roth. is a 85 y.o. male who presents with altered mental status.  Patient has a history of dementia.  Diagnosed with novel coronavirus 12 days ago, decreased p.o. intake over the last several days with increased confusion.  Possibly some shortness of breath, history limited by dementia.  Patient's daughter has insisted on being in the room with him.  Past Medical History:  Diagnosis Date  . Cancer ([place])    basal cell on eyelid  . CHF (congestive heart failure) ([place])   . Cirrhosis ([place])   . Hypertension   . Normal pressure hydrocephalus (HCC)   . Sleep apnea     Patient Active Problem List   Diagnosis Date Noted  . Syncope 11/18/2017  . Femur fracture, left ([place]) 08/30/2016  . Hip fracture ([place]) 01/20/2016  . GI bleed 08/10/2015    Past Surgical History:  Procedure Laterality Date  . HERNIA REPAIR    . HIP ARTHROPLASTY Left 01/20/2016   Procedure: ARTHROPLASTY BIPOLAR HIP (HEMIARTHROPLASTY);  Surgeon: Corky Mull, MD;  Location: [facility] ORS;  Service: Orthopedics;  Laterality: Left;  . IMPLANTABLE CARDIOVERTER DEFIBRILLATOR (ICD) GENERATOR CHANGE Left 07/13/2015   Procedure: ICD GENERATOR CHANGE;  Surgeon: Marzetta Board, MD;  Location: [facility] ORS;  Service: Cardiovascular;  Laterality: Left;  . JOINT REPLACEMENT Bilateral    knees  . ORIF FEMUR FRACTURE Left 08/30/2016   Procedure: OPEN REDUCTION INTERNAL FIXATION (ORIF) DISTAL FEMUR FRACTURE;  Surgeon: Hessie Knows, MD;  Location: [facility] ORS;  Service: Orthopedics;  Laterality: Left;  . PACEMAKER INSERTION  2006   Pace maker, defibulator    Prior to Admission medications   Medication Sig Start Date End Date Taking? Authorizing Provider  acetaminophen (TYLENOL) 325  MG tablet Take 650 mg by mouth every 4 (four) hours as needed for mild pain or moderate pain.    Yes [provider]  aspirin 81 MG chewable tablet Chew 81 mg by mouth daily.   Yes [provider]  bisacodyl (DULCOLAX) 10 MG suppository Place 1 suppository (10 mg total) rectally daily as needed for moderate constipation. 01/23/16  Yes Vaughan Basta, MD  Cholecalciferol 1.25 MG (50000 UT) capsule Take 50,000 Units by mouth every 30 (thirty) days.   Yes [provider]  dutasteride (AVODART) 0.5 MG capsule Take 0.5 mg by mouth daily.   Yes [provider]  Ensure (ENSURE) Take 237 mLs by mouth every evening.   Yes [provider]  ketoconazole (NIZORAL) 2 % cream Apply 1 application topically 2 (two) times daily. To left foot and toes   Yes [provider]  magnesium hydroxide (MILK OF MAGNESIA) 400 MG/5ML suspension Take 30 mLs by mouth every 12 (twelve) hours as needed for mild constipation.   Yes [provider]  memantine (NAMENDA) 10 MG tablet Take 10 mg by mouth 2 (two) times daily. 10/30/17  Yes [provider]  Misc Natural Products (LUTEIN 20 PO) Take 1 capsule by mouth daily.    Yes [provider]  nystatin (NYSTATIN) powder Apply 1 g topically 2 (two) times daily. Apply to groin and testicles   Yes [provider]  senna-docusate (SENOKOT-S) 8.6-50 MG tablet Take 1 tablet by mouth at bedtime as needed for mild constipation. 01/23/16  Yes Vaughan Basta, MD  torsemide (DEMADEX) 20 MG tablet Take 20 mg by mouth daily.   Yes [provider]     Allergies Cephalexin, Clindamycin/lincomycin, and Morphine and related  Family History  Problem Relation Age of Onset  . Heart disease Father   . Bipolar disorder Brother     Social History Social History   Tobacco Use  . Smoking status: Never Smoker  . Smokeless tobacco: Never Used  Substance Use Topics  . Alcohol use: No  .  Drug use: No    Review of Systems limited by dementia     ____________________________________________   PHYSICAL EXAM:  VITAL SIGNS: ED Triage Vitals  Enc Vitals Group     BP 11/12/18 1226 113/71     Pulse Rate 11/12/18 1226 (!) 138     Resp 11/12/18 1226 (!) 21     Temp 11/12/18 1226 97.6 F (36.4 C)     Temp Source 11/12/18 1226 Axillary     SpO2 11/12/18 1226 92 %     Weight 11/12/18 1227 86.2 kg (190 lb)     Height 11/12/18 1227 1.829 m (6')     Head Circumference --      Peak Flow --      Pain Score --      Pain Loc --      Pain Edu? --      Excl. in [place]? --     Constitutional: Arousable but drowsy and confused ENT: No rhinorrhea Mouth/Throat: Mucous membranes are dry Head: No trauma Cardiovascular: Irregular rate, regular rhythm. Grossly normal heart sounds.  Good peripheral circulation. Respiratory: Mild tachypnea no retractions.  Bibasilar Rales Gastrointestinal: Soft and nontender. No distention.   Musculoskeletal:   Warm and well perfused, no lower extremity swelling or edema  Skin:  Skin is warm, dry and intact. No rash noted.   ____________________________________________   LABS (all labs ordered are listed, but only abnormal results are displayed)  Labs Reviewed  CBC WITH DIFFERENTIAL/PLATELET - Abnormal; Notable for the following components:      Result Value   RBC 6.25 (*)    Hemoglobin 19.6 (*)    HCT 60.2 (*)    Neutro Abs 7.9 (*)    All other components within normal limits  COMPREHENSIVE METABOLIC PANEL - Abnormal; Notable for the following components:   Sodium 154 (*)    Glucose, Bld 140 (*)    BUN 57 (*)    Creatinine, Ser 1.68 (*)    AST 71 (*)    Total Bilirubin 1.7 (*)    GFR calc non Af Amer 35 (*)    GFR calc Af Amer 41 (*)    Anion gap 17 (*)    All other components within normal limits  FIBRIN DERIVATIVES D-DIMER ([facility] ONLY) - Abnormal; Notable for the following components:   Fibrin derivatives D-dimer [facility]) [id number]  (*)    All other components within normal limits  LACTATE DEHYDROGENASE - Abnormal; Notable for the following components:   LDH 275 (*)    All other components within normal limits  FERRITIN - Abnormal; Notable for the following components:   Ferritin 443 (*)    All other components within normal limits  FIBRINOGEN - Abnormal; Notable for the following components:   Fibrinogen 639 (*)    All other components within normal limits  CULTURE, BLOOD (ROUTINE X 2)  CULTURE, BLOOD (ROUTINE X 2)  TRIGLYCERIDES  LACTIC ACID, PLASMA  C-REACTIVE PROTEIN  PROCALCITONIN  LACTIC ACID, PLASMA   ____________________________________________  EKG  ED ECG REPORT I, Lavonia Drafts, the attending physician, personally viewed and interpreted this ECG.  Date: 11/12/2018  Rhythm: Atrial fibrillation with RVR QRS Axis: normal Intervals: Abnormal ST/T Wave abnormalities: Nonspecific changes Narrative Interpretation: no evidence of acute ischemia  ____________________________________________  RADIOLOGY  Chest x-ray demonstrates multifocal pneumonia ____________________________________________   PROCEDURES  Procedure(s) performed: No  Procedures   Critical Care performed: yes  CRITICAL CARE Performed by: Lavonia Drafts   Total critical care time: 30  minutes  Critical care time was exclusive of separately billable procedures and treating other patients.  Critical care was necessary to treat or prevent imminent or life-threatening deterioration.  Critical care was time spent personally by me on the following activities: development of treatment plan with patient and/or surrogate as well as nursing, discussions with consultants, evaluation of patient's response to treatment, examination of patient, obtaining history from patient or surrogate, ordering and performing treatments and interventions, ordering and review of laboratory studies, ordering and review of radiographic studies, pulse  oximetry and re-evaluation of patient's condition.  ____________________________________________   INITIAL IMPRESSION / ASSESSMENT AND PLAN / ED COURSE  Pertinent labs & imaging results that were available during my care of the patient were reviewed by me and considered in my medical decision making (see chart for details).  Patient presents with worsening mental status decreased p.o. intake in the setting of positive Covid.  Lab work is significant for elevated sodium of 154, elevated BUN/creatinine consistent with significant dehydration, IV fluids given.  Chest x-ray demonstrates viral pneumonia consistent with Covid.  Also now with atrial fibrillation with RVR.  Given IV Cardizem bolus with brief improvement, will require IV drip.  Granddaughter has requested transfer to [facility], contacted [facility] but they currently have no beds available.  Family has approved contacting [facility].  Not requiring oxygen at this time.    ____________________________________________   FINAL CLINICAL IMPRESSION(S) / ED DIAGNOSES  Final diagnoses:  Altered mental status, unspecified altered mental status type  Dehydration  COVID-19  Atrial fibrillation with rapid ventricular response ([place])        Note:  This document was prepared using Dragon voice recognition software and may include unintentional dictation errors.   Lavonia Drafts, MD 11/12/18 [phone]

## 2018-11-12 NOTE — ED Triage Notes (Signed)
Pt arrived via ems from [facility] for report of altered mental status - pt tested covid + last week - currently he appears in NAD - HR fluctuates between 120-180

## 2018-11-12 NOTE — ED Notes (Signed)
Lab here to collect blood

## 2018-11-12 NOTE — ED Notes (Addendum)
Daughter requested to be support person for patient related to dementia and care needs.  Cleared by Dr Corky Downs. Visitor advised to remain in room.  Utilizing own PPE.  RN Helene Kelp aware.

## 2018-11-12 NOTE — ED Notes (Signed)
EKG performed and revived by MD Nickolas Madrid. VSS at this time.

## 2018-11-12 NOTE — ED Notes (Signed)
[facility]  CALLED   

## 2018-11-12 NOTE — ED Notes (Signed)
Pt daughter cleared by Dr Corky Downs to come to room

## 2018-11-12 NOTE — H&P (Addendum)
History and Physical    Stephen Roth. [id number] DOB: [birth year] DOA: 11/12/2018  PCP: Adin Hector, MD  Patient coming from: SNF  I have personally briefly reviewed patient's old medical records in [place]  Chief Complaint: AMS  HPI: Stephen Roth. is a 85 y.o. male with medical history significant of Dementia, NPH, cirrhosis, HTN, CHF.  Patient resides in SNF at baseline due to fairly advanced dementia.  History is taken from chart due to AMS.  Diagnosed with novel coronavirus 12 days ago, decreased p.o. intake over the last several days with increased confusion.  Possibly some shortness of breath.  ED Course: CXR c/w multifocal PNA, WBC nl.  Initially A.Fib RVR in the 130s-140s.  Now rate controlled on cardizem gtt.  Completing the picture of dehydration: HGB 19.6, sodium 154, BUN 57 and creat 1.68.  Baseline creat <1.  Patient satting low to mid 90s on RA.  Lactate of 3.1.  CRP 7.1  Procalcitonin 0.11   Review of Systems: Unable to perform due to AMS.  Past Medical History:  Diagnosis Date   Cancer ([place])    basal cell on eyelid   CHF (congestive heart failure) ([place])    Cirrhosis (H[facility])    Hypertension    Normal pressure hydrocephalus ([facility])    Sleep apnea     Past Surgical History:  Procedure Laterality Date   HERNIA REPAIR     HIP ARTHROPLASTY Left 01/20/2016   Procedure: ARTHROPLASTY BIPOLAR HIP (HEMIARTHROPLASTY);  Surgeon: Corky Mull, MD;  Location: [facility] ORS;  Service: Orthopedics;  Laterality: Left;   IMPLANTABLE CARDIOVERTER DEFIBRILLATOR (ICD) GENERATOR CHANGE Left 07/13/2015   Procedure: ICD GENERATOR CHANGE;  Surgeon: Marzetta Board, MD;  Location: [facility] ORS;  Service: Cardiovascular;  Laterality: Left;   JOINT REPLACEMENT Bilateral    knees   ORIF FEMUR FRACTURE Left 08/30/2016   Procedure: OPEN REDUCTION INTERNAL FIXATION (ORIF) DISTAL FEMUR FRACTURE;  Surgeon: Hessie Knows, MD;  Location: [facility] ORS;   Service: Orthopedics;  Laterality: Left;   PACEMAKER INSERTION  2006   Pace maker, defibulator     reports that he has never smoked. He has never used smokeless tobacco. He reports that he does not drink alcohol or use drugs.  Allergies  Allergen Reactions   Cephalexin Other (See Comments)    Confusion, hives, hallucinatons   Clindamycin/Lincomycin Other (See Comments)    Confusion, hives, hallucinations   Morphine And Related Other (See Comments)    Family History  Problem Relation Age of Onset   Heart disease Father    Bipolar disorder Brother      Prior to Admission medications   Medication Sig Start Date End Date Taking? Authorizing Provider  acetaminophen (TYLENOL) 325 MG tablet Take 650 mg by mouth every 4 (four) hours as needed for mild pain or moderate pain.     [provider]  aspirin 81 MG chewable tablet Chew 81 mg by mouth daily.    [provider]  bisacodyl (DULCOLAX) 10 MG suppository Place 1 suppository (10 mg total) rectally daily as needed for moderate constipation. 01/23/16   Vaughan Basta, MD  Cholecalciferol 1.25 MG (50000 UT) capsule Take 50,000 Units by mouth every 30 (thirty) days.    [provider]  dutasteride (AVODART) 0.5 MG capsule Take 0.5 mg by mouth daily.    [provider]  Ensure (ENSURE) Take 237 mLs by mouth every evening.    [provider]  ketoconazole (NIZORAL)  2 % cream Apply 1 application topically 2 (two) times daily. To left foot and toes    [provider]  magnesium hydroxide (MILK OF MAGNESIA) 400 MG/5ML suspension Take 30 mLs by mouth every 12 (twelve) hours as needed for mild constipation.    [provider]  memantine (NAMENDA) 10 MG tablet Take 10 mg by mouth 2 (two) times daily. 10/30/17   [provider]  Misc Natural Products (LUTEIN 20 PO) Take 1 capsule by mouth daily.     [provider]  nystatin (NYSTATIN) powder Apply 1 g  topically 2 (two) times daily. Apply to groin and testicles    [provider]  senna-docusate (SENOKOT-S) 8.6-50 MG tablet Take 1 tablet by mouth at bedtime as needed for mild constipation. 01/23/16   Vaughan Basta, MD  torsemide (DEMADEX) 20 MG tablet Take 20 mg by mouth daily.    [provider]    Physical Exam: There were no vitals filed for this visit.  Constitutional: NAD, altered, confused Eyes: PERRL, lids and conjunctivae normal ENMT: Mucous membranes are moist. Posterior pharynx clear of any exudate or lesions.Normal dentition.  Neck: normal, supple, no masses, no thyromegaly Respiratory: clear to auscultation bilaterally, no wheezing, no crackles. Normal respiratory effort. No accessory muscle use.  Cardiovascular: Regular rate and rhythm, no murmurs / rubs / gallops. No extremity edema. 2+ pedal pulses. No carotid bruits.  Abdomen: no tenderness, no masses palpated. No hepatosplenomegaly. Bowel sounds positive.  Musculoskeletal: no clubbing / cyanosis. No joint deformity upper and lower extremities. Good ROM, no contractures. Normal muscle tone.  Skin: no rashes, lesions, ulcers. No induration Neurologic: MAE to noxious stimuli, patient somnlent, confused when he wakes up, GCS 11 Psychiatric: GCS 11  Labs on Admission: I have personally reviewed following labs and imaging studies  CBC: Recent Labs  Lab 11/12/18 1252  WBC 9.9  NEUTROABS 7.9*  HGB 19.6*  HCT 60.2*  MCV 96.3  PLT 99991111   Basic Metabolic Panel: Recent Labs  Lab 11/12/18 1252  NA 154*  K 3.9  CL 109  CO2 28  GLUCOSE 140*  BUN 57*  CREATININE 1.68*  CALCIUM 9.9   GFR: Estimated Creatinine Clearance: 31.4 mL/min (A) (by C-G formula based on SCr of 1.68 mg/dL (H)). Liver Function Tests: Recent Labs  Lab 11/12/18 1252  AST 71*  ALT 37  ALKPHOS 84  BILITOT 1.7*  PROT 7.9  ALBUMIN 3.7   No results for input(s): LIPASE, AMYLASE in the last 168 hours. No results for  input(s): AMMONIA in the last 168 hours. Coagulation Profile: No results for input(s): INR, PROTIME in the last 168 hours. Cardiac Enzymes: No results for input(s): CKTOTAL, CKMB, CKMBINDEX, TROPONINI in the last 168 hours. BNP (last 3 results) No results for input(s): PROBNP in the last 8760 hours. HbA1C: No results for input(s): HGBA1C in the last 72 hours. CBG: No results for input(s): GLUCAP in the last 168 hours. Lipid Profile: Recent Labs    11/12/18 1252  TRIG 125   Thyroid Function Tests: No results for input(s): TSH, T4TOTAL, FREET4, T3FREE, THYROIDAB in the last 72 hours. Anemia Panel: Recent Labs    11/12/18 1252  FERRITIN 443*   Urine analysis:    Component Value Date/Time   COLORURINE YELLOW (A) 11/18/2017 1136   APPEARANCEUR CLEAR (A) 11/18/2017 1136   LABSPEC 1.010 11/18/2017 1136   PHURINE 5.0 11/18/2017 1136   GLUCOSEU NEGATIVE 11/18/2017 1136   HGBUR NEGATIVE 11/18/2017 1136   BILIRUBINUR  NEGATIVE 11/18/2017 1136   KETONESUR 5 (A) 11/18/2017 1136   PROTEINUR NEGATIVE 11/18/2017 1136   NITRITE POSITIVE (A) 11/18/2017 1136   LEUKOCYTESUR MODERATE (A) 11/18/2017 1136    Radiological Exams on Admission: Dg Chest Port 1 View  Result Date: 11/12/2018 CLINICAL DATA:  Altered mental status. COVID-19 positive last week. Tachycardia. EXAM: PORTABLE CHEST 1 VIEW COMPARISON:  11/18/2017 FINDINGS: Left-sided pacemaker unchanged. Lungs are somewhat hypoinflated with mild patchy airspace opacification over the mid to lower lungs suggesting multifocal infection. No effusion. Borderline stable cardiomegaly. Remainder of the exam is unchanged. IMPRESSION: Hypoinflation with mild patchy airspace process over the mid to lower lungs likely multifocal infection. Electronically Signed   By: Marin Olp M.D.   On: 11/12/2018 13:07    EKG: Independently reviewed.  Assessment/Plan Principal Problem:   Acute encephalopathy Active Problems:   Hypernatremia   AKI (acute  kidney injury) ([place])   Dementia without behavioral disturbance ([place])   COVID-19 virus infection   Atrial fibrillation with RVR ([place])    1. Acute encephalopathy on top of chronic dementia - due to dehydration and/or COVID-19 most likely 1. Apparently patient takes PO meds, water, food at baseline, clearly not able to do that now.  Not clear on what mental status otherwise is like at baseline though. 2. Getting CT head to r/o obvious intracranial pathology 3. Treating dehydration and COVID as below 2. Hypernatremia, AKI - dehydration 1. Got 1L NS in ED 2. Putting patient on D5W at 100 cc/hr 3. BMP now and Q6H 4. Strict intake and output 3. COVID-19 1. No O2 requirement currently 2. COVID pathway 3. Not candidate for remdesivir at this time 4. Will hold off on steroids as well since no O2 requirement and instead defer to day team if they want to start these or not. 5. Procalcitonin of 0.11 - no indication for ABx 6. Cont pulse ox 4. A.Fib RVR - 1. Rate controlled with cardizem gtt 2. HGB 19, no evidence or history in chart of bleed 3. If CT head neg for acute findings or bleed, then will start heparin gtt  DVT prophylaxis: SCDs, heparin gtt for A.Fib if CT head neg for bleed Code Status: DNR - yellow form in chart Family Communication: Tried calling [name] (daughter) but went to voice mail. Disposition Plan: SNF after admit Consults called: None Admission status: Admit to inpatient  Severity of Illness: The appropriate patient status for this patient is INPATIENT. Inpatient status is judged to be reasonable and necessary in order to provide the required intensity of service to ensure the patient's safety. The patient's presenting symptoms, physical exam findings, and initial radiographic and laboratory data in the context of their chronic comorbidities is felt to place them at high risk for further clinical deterioration. Furthermore, it is not anticipated that the patient will  be medically stable for discharge from the hospital within 2 midnights of admission. The following factors support the patient status of inpatient.   IP status for acute encephalopathy, AKI, hypernatremia, and dehydration.  All in the setting of COVID pneumonia.  Patient not taking POs right now.   * I certify that at the point of admission it is my clinical judgment that the patient will require inpatient hospital care spanning beyond 2 midnights from the point of admission due to high intensity of service, high risk for further deterioration and high frequency of surveillance required.*    Stephen Roth M. DO [facility]  How to contact the [facility] Attending  or Consulting provider [place] or covering provider during after hours [place], for this patient?  1. Check the care team in [facility] and look for a) attending/consulting [facility] provider listed and b) the [facility] team listed 2. Log into [email or web address]  Amion Physician Scheduling and messaging for groups and whole hospitals  On call and physician scheduling software for group practices, residents, hospitalists and other medical providers for call, clinic, rotation and shift schedules. On[employer] is a hospital-wide system for scheduling doctors and paging doctors on call. EasyPlot is for scientific plotting and data analysis.  [email or web address]  and use [facility]'s universal password to access. If you do not have the password, please contact the hospital operator.  3. Locate the [facility] provider you are looking for under [facility] and page to a number that you can be directly reached. 4. If you still have difficulty reaching the provider, please page the [facility] (Director on Call) for the Hospitalists listed on amion for assistance.  11/12/2018, 11:19 PM

## 2018-11-12 NOTE — ED Notes (Signed)
Pt unable to sign due to pt AMS at this time.

## 2018-11-13 ENCOUNTER — Other Ambulatory Visit: Payer: Self-pay

## 2018-11-13 ENCOUNTER — Inpatient Hospital Stay (HOSPITAL_COMMUNITY): Payer: Medicare Other

## 2018-11-13 ENCOUNTER — Encounter (HOSPITAL_COMMUNITY): Payer: Self-pay | Admitting: *Deleted

## 2018-11-13 DIAGNOSIS — L89629 Pressure ulcer of left heel, unspecified stage: Secondary | ICD-10-CM | POA: Diagnosis present

## 2018-11-13 LAB — BASIC METABOLIC PANEL
Anion gap: 14 (ref 5–15)
Anion gap: 13 (ref 5–15)
Anion gap: 13 (ref 5–15)
Anion gap: 12 (ref 5–15)
BUN: 59 mg/dL — ABNORMAL HIGH (ref 8–23)
BUN: 62 mg/dL — ABNORMAL HIGH (ref 8–23)
BUN: 63 mg/dL — ABNORMAL HIGH (ref 8–23)
BUN: 68 mg/dL — ABNORMAL HIGH (ref 8–23)
CO2: 29 mmol/L (ref 22–32)
CO2: 26 mmol/L (ref 22–32)
CO2: 25 mmol/L (ref 22–32)
CO2: 27 mmol/L (ref 22–32)
Calcium: 9.5 mg/dL (ref 8.9–10.3)
Calcium: 9.1 mg/dL (ref 8.9–10.3)
Calcium: 8.8 mg/dL — ABNORMAL LOW (ref 8.9–10.3)
Calcium: 8.9 mg/dL (ref 8.9–10.3)
Chloride: 111 mmol/L (ref 98–111)
Chloride: 111 mmol/L (ref 98–111)
Chloride: 113 mmol/L — ABNORMAL HIGH (ref 98–111)
Chloride: 111 mmol/L (ref 98–111)
Creatinine, Ser: 1.9 mg/dL — ABNORMAL HIGH (ref 0.61–1.24)
Creatinine, Ser: 1.6 mg/dL — ABNORMAL HIGH (ref 0.61–1.24)
Creatinine, Ser: 1.66 mg/dL — ABNORMAL HIGH (ref 0.61–1.24)
Creatinine, Ser: 1.59 mg/dL — ABNORMAL HIGH (ref 0.61–1.24)
GFR calc Af Amer: 35 mL/min — ABNORMAL LOW (ref >60)
GFR calc Af Amer: 43 mL/min — ABNORMAL LOW (ref >60)
GFR calc Af Amer: 41 mL/min — ABNORMAL LOW (ref >60)
GFR calc Af Amer: 43 mL/min — ABNORMAL LOW (ref >60)
GFR calc non Af Amer: 30 mL/min — ABNORMAL LOW (ref >60)
GFR calc non Af Amer: 37 mL/min — ABNORMAL LOW (ref >60)
GFR calc non Af Amer: 35 mL/min — ABNORMAL LOW (ref >60)
GFR calc non Af Amer: 37 mL/min — ABNORMAL LOW (ref >60)
Glucose, Bld: 170 mg/dL — ABNORMAL HIGH (ref 70–99)
Glucose, Bld: 210 mg/dL — ABNORMAL HIGH (ref 70–99)
Glucose, Bld: 155 mg/dL — ABNORMAL HIGH (ref 70–99)
Glucose, Bld: 153 mg/dL — ABNORMAL HIGH (ref 70–99)
Potassium: 3.7 mmol/L (ref 3.5–5.1)
Potassium: 3.5 mmol/L (ref 3.5–5.1)
Potassium: 3.4 mmol/L — ABNORMAL LOW (ref 3.5–5.1)
Potassium: 3.2 mmol/L — ABNORMAL LOW (ref 3.5–5.1)
Sodium: 154 mmol/L — ABNORMAL HIGH (ref 135–145)
Sodium: 150 mmol/L — ABNORMAL HIGH (ref 135–145)
Sodium: 151 mmol/L — ABNORMAL HIGH (ref 135–145)
Sodium: 150 mmol/L — ABNORMAL HIGH (ref 135–145)

## 2018-11-13 LAB — CBC WITH DIFFERENTIAL/PLATELET
Abs Immature Granulocytes: 0.05 10*3/uL (ref 0.00–0.07)
Basophils Absolute: 0 10*3/uL (ref 0.0–0.1)
Basophils Relative: 0 %
Eosinophils Absolute: 0 10*3/uL (ref 0.0–0.5)
Eosinophils Relative: 0 %
HCT: 55.1 % — ABNORMAL HIGH (ref 39.0–52.0)
Hemoglobin: 18.3 g/dL — ABNORMAL HIGH (ref 13.0–17.0)
Immature Granulocytes: 1 %
Lymphocytes Relative: 6 %
Lymphs Abs: 0.5 10*3/uL — ABNORMAL LOW (ref 0.7–4.0)
MCH: 31.9 pg (ref 26.0–34.0)
MCHC: 33.2 g/dL (ref 30.0–36.0)
MCV: 96.2 fL (ref 80.0–100.0)
Monocytes Absolute: 0.6 10*3/uL (ref 0.1–1.0)
Monocytes Relative: 7 %
Neutro Abs: 7.4 10*3/uL (ref 1.7–7.7)
Neutrophils Relative %: 86 %
Platelets: 242 10*3/uL (ref 150–400)
RBC: 5.73 MIL/uL (ref 4.22–5.81)
RDW: 13 % (ref 11.5–15.5)
WBC: 8.5 10*3/uL (ref 4.0–10.5)
nRBC: 0 % (ref 0.0–0.2)

## 2018-11-13 LAB — HEPATIC FUNCTION PANEL
ALT: 35 U/L (ref 0–44)
AST: 58 U/L — ABNORMAL HIGH (ref 15–41)
Albumin: 3.2 g/dL — ABNORMAL LOW (ref 3.5–5.0)
Alkaline Phosphatase: 76 U/L (ref 38–126)
Bilirubin, Direct: 0.6 mg/dL — ABNORMAL HIGH (ref 0.0–0.2)
Indirect Bilirubin: 1.4 mg/dL — ABNORMAL HIGH (ref 0.3–0.9)
Total Bilirubin: 2 mg/dL — ABNORMAL HIGH (ref 0.3–1.2)
Total Protein: 6.6 g/dL (ref 6.5–8.1)

## 2018-11-13 LAB — C-REACTIVE PROTEIN: CRP: 5.9 mg/dL — ABNORMAL HIGH (ref <1.0)

## 2018-11-13 LAB — MRSA PCR SCREENING: MRSA by PCR: NEGATIVE

## 2018-11-13 LAB — D-DIMER, QUANTITATIVE: D-Dimer, Quant: 2.81 ug/mL-FEU — ABNORMAL HIGH (ref 0.00–0.50)

## 2018-11-13 LAB — ABO/RH: ABO/RH(D): O POS

## 2018-11-13 MED ORDER — ENOXAPARIN SODIUM 40 MG/0.4ML ~~LOC~~ SOLN
40.0000 mg | SUBCUTANEOUS | Status: DC
  Administered 2018-11-13 – 2018-11-24 (×12): 40 mg via SUBCUTANEOUS
  Filled 2018-11-13 (×12): qty 0.4

## 2018-11-13 MED ORDER — POTASSIUM CHLORIDE 10 MEQ/100ML IV SOLN
10.0000 meq | INTRAVENOUS | Status: AC
  Administered 2018-11-13 – 2018-11-14 (×4): 10 meq via INTRAVENOUS
  Filled 2018-11-13 (×3): qty 100

## 2018-11-13 MED ORDER — HEPARIN BOLUS VIA INFUSION
4500.0000 [IU] | Freq: Once | INTRAVENOUS | Status: AC
  Administered 2018-11-13: 4500 [IU] via INTRAVENOUS
  Filled 2018-11-13: qty 4500

## 2018-11-13 MED ORDER — HEPARIN (PORCINE) 25000 UT/250ML-% IV SOLN
1300.0000 [IU]/h | INTRAVENOUS | Status: DC
  Administered 2018-11-13: 05:00:00 1300 [IU]/h via INTRAVENOUS
  Filled 2018-11-13: qty 250

## 2018-11-13 MED ORDER — ACETAMINOPHEN 325 MG PO TABS: 650.0000 mg | ORAL_TABLET | Freq: Four times a day (QID) | ORAL | Status: DC | PRN

## 2018-11-13 MED ORDER — SODIUM CHLORIDE 0.9 % IV SOLN
100.0000 mg | INTRAVENOUS | Status: AC
  Administered 2018-11-14 – 2018-11-17 (×4): 100 mg via INTRAVENOUS
  Filled 2018-11-13 (×4): qty 20

## 2018-11-13 MED ORDER — ALBUTEROL SULFATE HFA 108 (90 BASE) MCG/ACT IN AERS
2.0000 | INHALATION_SPRAY | RESPIRATORY_TRACT | Status: DC | PRN
  Filled 2018-11-13: qty 6.7

## 2018-11-13 MED ORDER — DUTASTERIDE 0.5 MG PO CAPS
0.5000 mg | ORAL_CAPSULE | Freq: Every day | ORAL | Status: DC
  Administered 2018-11-15: 0.5 mg via ORAL
  Filled 2018-11-13 (×13): qty 1

## 2018-11-13 MED ORDER — MEMANTINE HCL 10 MG PO TABS
10.0000 mg | ORAL_TABLET | Freq: Two times a day (BID) | ORAL | Status: DC
  Administered 2018-11-14 – 2018-11-23 (×16): 10 mg via ORAL
  Filled 2018-11-13 (×26): qty 1

## 2018-11-13 MED ORDER — ASPIRIN 81 MG PO CHEW
81.0000 mg | CHEWABLE_TABLET | Freq: Every day | ORAL | Status: DC
  Administered 2018-11-15 – 2018-11-19 (×3): 81 mg via ORAL
  Filled 2018-11-13 (×5): qty 1

## 2018-11-13 MED ORDER — SODIUM CHLORIDE 0.9 % IV SOLN
200.0000 mg | Freq: Once | INTRAVENOUS | Status: AC
  Administered 2018-11-13: 200 mg via INTRAVENOUS
  Filled 2018-11-13: qty 40

## 2018-11-13 MED ORDER — NYSTATIN 100000 UNIT/GM EX POWD
1.0000 g | Freq: Two times a day (BID) | CUTANEOUS | Status: DC
  Administered 2018-11-13 – 2018-11-26 (×26): 1 g via TOPICAL
  Filled 2018-11-13: qty 15

## 2018-11-13 NOTE — Progress Notes (Signed)
K 3.2, ordering 4 runs of IV K as patient NPO due to AMS currently.

## 2018-11-13 NOTE — Progress Notes (Addendum)
Spoke with patient's daughter [name]. Updated on patient condition and plan of care. All questions answered  Physician notified of K 3.2

## 2018-11-13 NOTE — Progress Notes (Signed)
ANTICOAGULATION CONSULT NOTE - Initial Consult  Pharmacy Consult for IV heparin Indication: atrial fibrillation  Allergies  Allergen Reactions  . Cephalexin Other (See Comments)    Confusion, hives, hallucinatons  . Clindamycin/Lincomycin Other (See Comments)    Confusion, hives, hallucinations  . Morphine And Related Other (See Comments)    Patient Measurements:   Heparin Dosing Weight: 86.2 kg  Vital Signs: Temp: 97.7 F (36.5 C) (10/30 2300) Temp Source: Axillary (10/30 2300) BP: 123/70 (10/30 2300) Pulse Rate: 88 (10/30 2300)  Labs: Recent Labs    11/12/18 1252 11/12/18 2345  HGB 19.6*  --   HCT 60.2*  --   PLT 257  --   CREATININE 1.68* 1.90*    Estimated Creatinine Clearance: 27.8 mL/min (A) (by C-G formula based on SCr of 1.9 mg/dL (H)).   Medical History: Past Medical History:  Diagnosis Date  . Cancer ([place])    basal cell on eyelid  . CHF (congestive heart failure) ([place])   . Cirrhosis ([place])   . Hypertension   . Normal pressure hydrocephalus (H[facility])   . Sleep apnea     Medications:  Scheduled:    Assessment: Pharmacy is consulted to dose heparin infusion in 85 yo male diagnosed with atrial fibrillation. CT shows no intracranial bleed. Pt not on any blood thinners PTA per med rec.   Today, 11/13/18  Scr 1.90 mg/dl, CrCl 28 ml/min  Hgb 19.6, pltt 257. Per notes pt is dehydrated, so Hgb is likely hemoconcentrated.     Goal of Therapy:  Heparin level 0.3-0.7 units/ml Monitor platelets by anticoagulation protocol: Yes   Plan:   Heparin 4500 unit bolus followed by heparin 1300 units/hr  Obtain HL 8 hours after start of infusion  Daily CBC while on heparin   Monitor for signs and symptoms of bleeding   Royetta Asal, PharmD, BCPS 11/13/2018 3:48 AM

## 2018-11-13 NOTE — Progress Notes (Signed)
Note  Patient Details Name: Stephen Roth. MRN: [id number] DOB: [birth year]   Treatment:     Order for swallow evaluation received, will conduct as soon as able. Thanks for this consult.   Luanna Salk, MS [facility] SLP Acute Rehab Services Pager [phone] Office [phone]   Macario Golds 11/13/2018, 2:34 PM

## 2018-11-13 NOTE — Progress Notes (Signed)
Patient taken off the floor for head CT with myself and Charge nurseFredrich Romans  assistance. Tolerated testing well. Currently on Cardizem drip at rate of 12.21ml/hr  with controlled afib rate ranging bet 82-90 , sats @100 % on 2lL RR bet 16-22 . No s/s for respiratory  or cardiac distress noted at this time

## 2018-11-13 NOTE — Progress Notes (Signed)
PROGRESS NOTE                                                                                                                                                                                                             Patient Demographics:    Stephen Roth, is a 85 y.o. male, DOB - 1926-01-05, [id number]  Outpatient Primary MD for the patient is Adin Hector, MD   Admit date - 11/12/2018   LOS - 1  No chief complaint on file.      Brief Narrative: Patient is a 85 y.o. male with PMHx of dementia, chronic systolic heart failure-s/p AICD, cirrhosis, NPH-resident of a skilled nursing facility-presented to [facility] ED on 10/30 with worsening confusion, found to have COVID-19 viral pneumonia, hyponatremia and A. fib with RVR.  See below for further details.  Per ED note at [facility]-patient was diagnosed with COVID-19 at [facility] skilled nursing facility in [place] approximately 12 days prior to admission.   Subjective:    [name] today remains very confused-he was lying comfortably in bed-he was not in any distress.   Assessment  & Plan :   Covid 19 Viral pneumonia: Appears comfortable-not hypoxic-afebrile-qualifies for remdesivir-which we will start today.  Given lack of hypoxemia-hold off on steroids.  Fever: afebrile  O2 requirements: RA  COVID-19 Labs: Recent Labs    11/12/18 1252 11/12/18 1443 11/13/18 0648  DDIMER  --   --  2.81*  FERRITIN 443*  --   --   LDH 275*  --   --   CRP  --  7.1* 5.9*    No results found for: SARSCOV2NAA   COVID-19 Medications: Steroids: Not start:ed Remdesivir: 10/31>> Actemra: Not indicated Convalescent Plasma: Not indicated  research Studies:N/A  Other medications: Diuretics:Euvolemic-no need for lasix Antibiotics:Not needed as no evidence of bacterial infection  Prone/Incentive Spirometry: Unable to perform due to severe dementia.  DVT Prophylaxis  :  Starting Lovenox-stopping heparin GTT  Acute metabolic encephalopathy on top of advanced dementia: Acute metabolic encephalopathy likely multifactorial-secondary to COVID-19 infection, hyponatremia and AKI-still appears to be very confused-not sure how far from his usual baseline.  CT head on admission negative-continue supportive care.  Hypernatremia: Secondary to poor oral intake-in the setting of COVID-19 encephalopathy/infection-sodium levels improving with supportive care/gentle hydration  AKI: Likely hemodynamically mediated  A. fib  with RVR: We will continue Cardizem drip-we will speak with RN staff to see if we can slowly titrate this off.  Currently on IV heparin as well-but suspect is a poor long-term candidate for anticoagulation-spoke with patient's granddaughter who is a hospitalist with the [facility] system-family does not desire long-term anticoagulation-stop IV heparin-we will place on baby aspirin.    Chronic systolic heart failure: Remains compensated-watch closely while on IV fluids.  Hold diuretics given hyponatremia and AKI.  Dementia: Appears to be advanced resume Namenda once oral intake is slightly better.  NPH: Stable for outpatient follow-up with outpatient MDs  Reported history of liver cirrhosis: Compensated-stable for outpatient follow-up  Palliative care: DNR in place.  Spoke with patient's daughter-who asked me to speak with patient's granddaughter Dr. Josephine Cables who is a hospitalist with the [facility] system.  Goals of care are for gentle medical treatment-family does not desire aggressive care.  ABG: No results found for: PHART, PCO2ART, PO2ART, HCO3, TCO2, ACIDBASEDEF, O2SAT  Vent Settings: N/A  Condition - Extremely Guarded  Family Communication  : Spoke with patient's daughter over the phone-who asked me to talk with Dr. Josephine Cables (patient's granddaughter who is a hospitalist with the [facility] system).  Subsequently spoke with Dr. [name]  [phone].  Code Status :  DNR  Diet :  Diet Order            Diet NPO time specified  Diet effective now               Disposition Plan  :  Remain hospitalized  Barriers to discharge: Hypoxia requiring O2 supplementation/complete 5 days of IV Remdesivir  Consults  :  None  Procedures  :  None  Antibiotics  :    Anti-infectives (From admission, onward)   Start     Dose/Rate Route Frequency Ordered Stop   11/14/18 1000  remdesivir 100 mg in sodium chloride 0.9 % 250 mL IVPB     100 mg 500 mL/hr over 30 Minutes Intravenous Every 24 hours 11/13/18 0715 11/18/18 0959   11/13/18 1000  remdesivir 200 mg in sodium chloride 0.9 % 250 mL IVPB     200 mg 500 mL/hr over 30 Minutes Intravenous Once 11/13/18 0715        Inpatient Medications  Scheduled Meds: Continuous Infusions: . dextrose 100 mL/hr at 11/12/18 2358  . diltiazem (CARDIZEM) infusion 12.5 mg/hr (11/13/18 0703)  . heparin 1,300 Units/hr (11/13/18 0528)  . remdesivir 200 mg in NS 250 mL     Followed by  . [START ON 11/14/2018] remdesivir 100 mg in NS 250 mL     PRN Meds:.ondansetron **OR** ondansetron (ZOFRAN) IV   Time Spent in minutes  25  See all Orders from today for further details   Oren Binet M.D on 11/13/2018 at 9:49 AM  To page go to [email or web address] - use universal password  [facility] -  Office  [phone]    Objective:   Vitals:   11/12/18 2300 11/13/18 0716 11/13/18 0729  BP: 123/70 115/86 115/86  Pulse: 88 (!) 57 (!) 57  Resp:  (!) 23 (!) 22  Temp: 97.7 F (36.5 C) 98.3 F (36.8 C) 98.2 F (36.8 C)  TempSrc: Axillary Axillary Axillary  SpO2:  97%   Weight:   86.2 kg  Height:   6' (1.829 m)    Wt Readings from Last 3 Encounters:  11/13/18 86.2 kg  11/12/18 86.2 kg  11/19/17 79.1 kg    No intake or output  data in the 24 hours ending 11/13/18 0949   Physical Exam Gen Exam: Awake but confused-says high but mumbles most of the time. HEENT:atraumatic,  normocephalic Chest: B/L clear to auscultation anteriorly CVS:S1S2 regular Abdomen:soft non tender, non distended Extremities:no edema Neurology: Difficult exam-but appears nonfocal Skin: no rash   Data Review:    CBC Recent Labs  Lab 11/12/18 1252 11/13/18 0648  WBC 9.9 8.5  HGB 19.6* 18.3*  HCT 60.2* 55.1*  PLT 257 242  MCV 96.3 96.2  MCH 31.4 31.9  MCHC 32.6 33.2  RDW 12.9 13.0  LYMPHSABS 1.0 0.5*  MONOABS 0.9 0.6  EOSABS 0.0 0.0  BASOSABS 0.0 0.0    Chemistries  Recent Labs  Lab 11/12/18 1252 11/12/18 2345 11/13/18 0648  NA 154* 154* 150*  K 3.9 3.7 3.5  CL 109 111 111  CO2 28 29 26   GLUCOSE 140* 170* 210*  BUN 57* 59* 62*  CREATININE 1.68* 1.90* 1.60*  CALCIUM 9.9 9.5 9.1  AST 71*  --  58*  ALT 37  --  35  ALKPHOS 84  --  76  BILITOT 1.7*  --  2.0*   ------------------------------------------------------------------------------------------------------------------ Recent Labs    11/12/18 1252  TRIG 125    No results found for: HGBA1C ------------------------------------------------------------------------------------------------------------------ No results for input(s): TSH, T4TOTAL, T3FREE, THYROIDAB in the last 72 hours.  Invalid input(s): FREET3 ------------------------------------------------------------------------------------------------------------------ Recent Labs    11/12/18 1252  FERRITIN 443*    Coagulation profile No results for input(s): INR, PROTIME in the last 168 hours.  Recent Labs    11/13/18 0648  DDIMER 2.81*    Cardiac Enzymes No results for input(s): CKMB, TROPONINI, MYOGLOBIN in the last 168 hours.  Invalid input(s): CK ------------------------------------------------------------------------------------------------------------------    Component Value Date/Time   BNP 121.0 (H) 11/12/2018 1252    Micro Results Recent Results (from the past 240 hour(s))  Blood Culture (routine x 2)     Status: None  (Preliminary result)   Collection Time: 11/12/18 12:30 PM   Specimen: BLOOD  Result Value Ref Range Status   Specimen Description BLOOD BLOOD LEFT FOREARM  Final   Special Requests   Final    BOTTLES DRAWN AEROBIC AND ANAEROBIC Blood Culture results may not be optimal due to an inadequate volume of blood received in culture bottles   Culture   Final    NO GROWTH < 24 HOURS Performed at [facility], [address] [place], [place] [address]    Report Status PENDING  Incomplete  Blood Culture (routine x 2)     Status: None (Preliminary result)   Collection Time: 11/12/18 12:52 PM   Specimen: BLOOD  Result Value Ref Range Status   Specimen Description BLOOD BLOOD RIGHT FOREARM  Final   Special Requests   Final    BOTTLES DRAWN AEROBIC AND ANAEROBIC Blood Culture adequate volume   Culture   Final    NO GROWTH < 24 HOURS Performed at [facility], [address], [place], [place] [address]    Report Status PENDING  Incomplete  MRSA PCR Screening     Status: None   Collection Time: 11/13/18  2:20 AM   Specimen: Nasopharyngeal  Result Value Ref Range Status   MRSA by PCR NEGATIVE NEGATIVE Final    Comment:        The GeneXpert MRSA Assay (FDA approved for NASAL specimens only), is one component of a comprehensive MRSA colonization surveillance program. It is not intended to diagnose MRSA infection nor  to guide or monitor treatment for MRSA infections. Performed at [facility], [place] [address] [place], [place] [address]     Radiology Reports Ct Head Wo Contrast  Result Date: 11/13/2018 CLINICAL DATA:  Encephalopathy EXAM: CT HEAD WITHOUT CONTRAST TECHNIQUE: Contiguous axial images were obtained from the base of the skull through the vertex without intravenous contrast. COMPARISON:  11/18/2017 FINDINGS: Brain: There is no mass, hemorrhage or extra-axial collection. There is generalized atrophy without lobar predilection. Hypodensity of  the white matter is most commonly associated with chronic microvascular disease. Vascular: Atherosclerotic calcification of the internal carotid arteries at the skull base. No abnormal hyperdensity of the major intracranial arteries or dural venous sinuses. Skull: The visualized skull base, calvarium and extracranial soft tissues are normal. Sinuses/Orbits: No fluid levels or advanced mucosal thickening of the visualized paranasal sinuses. No mastoid or middle ear effusion. The orbits are normal. IMPRESSION: Severe atrophy and chronic ischemic white matter disease. No acute abnormality. Electronically Signed   By: Ulyses Jarred M.D.   On: 11/13/2018 02:48   Dg Chest Port 1 View  Result Date: 11/12/2018 CLINICAL DATA:  Altered mental status. COVID-19 positive last week. Tachycardia. EXAM: PORTABLE CHEST 1 VIEW COMPARISON:  11/18/2017 FINDINGS: Left-sided pacemaker unchanged. Lungs are somewhat hypoinflated with mild patchy airspace opacification over the mid to lower lungs suggesting multifocal infection. No effusion. Borderline stable cardiomegaly. Remainder of the exam is unchanged. IMPRESSION: Hypoinflation with mild patchy airspace process over the mid to lower lungs likely multifocal infection. Electronically Signed   By: Marin Olp M.D.   On: 11/12/2018 13:07

## 2018-11-13 NOTE — Progress Notes (Signed)
Pharmacy Brief Note   O:  ALT: 37  CXR: Lungs are somewhat hypoinflated with mild patchy airspace opacification over the mid to lower lungs suggesting multifocal infection    A/P:  Patient meets requirements for remdesivir therapy.  Will start  remdesivir 200 mg IV x 1  followed by 100 mg IV daily x 4 days.  Monitor ALT  Royetta Asal, PharmD, BCPS 11/13/2018 7:12 AM

## 2018-11-14 LAB — CBC WITH DIFFERENTIAL/PLATELET
Abs Immature Granulocytes: 0.07 10*3/uL (ref 0.00–0.07)
Basophils Absolute: 0 10*3/uL (ref 0.0–0.1)
Basophils Relative: 0 %
Eosinophils Absolute: 0 10*3/uL (ref 0.0–0.5)
Eosinophils Relative: 0 %
HCT: 54.2 % — ABNORMAL HIGH (ref 39.0–52.0)
Hemoglobin: 17.5 g/dL — ABNORMAL HIGH (ref 13.0–17.0)
Immature Granulocytes: 1 %
Lymphocytes Relative: 6 %
Lymphs Abs: 0.7 10*3/uL (ref 0.7–4.0)
MCH: 31.4 pg (ref 26.0–34.0)
MCHC: 32.3 g/dL (ref 30.0–36.0)
MCV: 97.3 fL (ref 80.0–100.0)
Monocytes Absolute: 1.1 10*3/uL — ABNORMAL HIGH (ref 0.1–1.0)
Monocytes Relative: 9 %
Neutro Abs: 11 10*3/uL — ABNORMAL HIGH (ref 1.7–7.7)
Neutrophils Relative %: 84 %
Platelets: 264 10*3/uL (ref 150–400)
RBC: 5.57 MIL/uL (ref 4.22–5.81)
RDW: 12.9 % (ref 11.5–15.5)
WBC: 12.9 10*3/uL — ABNORMAL HIGH (ref 4.0–10.5)
nRBC: 0 % (ref 0.0–0.2)

## 2018-11-14 LAB — COMPREHENSIVE METABOLIC PANEL
ALT: 46 U/L — ABNORMAL HIGH (ref 0–44)
AST: 72 U/L — ABNORMAL HIGH (ref 15–41)
Albumin: 3.1 g/dL — ABNORMAL LOW (ref 3.5–5.0)
Alkaline Phosphatase: 76 U/L (ref 38–126)
Anion gap: 14 (ref 5–15)
BUN: 62 mg/dL — ABNORMAL HIGH (ref 8–23)
CO2: 26 mmol/L (ref 22–32)
Calcium: 9 mg/dL (ref 8.9–10.3)
Chloride: 111 mmol/L (ref 98–111)
Creatinine, Ser: 1.52 mg/dL — ABNORMAL HIGH (ref 0.61–1.24)
GFR calc Af Amer: 46 mL/min — ABNORMAL LOW (ref >60)
GFR calc non Af Amer: 39 mL/min — ABNORMAL LOW (ref >60)
Glucose, Bld: 127 mg/dL — ABNORMAL HIGH (ref 70–99)
Potassium: 3.6 mmol/L (ref 3.5–5.1)
Sodium: 151 mmol/L — ABNORMAL HIGH (ref 135–145)
Total Bilirubin: 1.3 mg/dL — ABNORMAL HIGH (ref 0.3–1.2)
Total Protein: 6.5 g/dL (ref 6.5–8.1)

## 2018-11-14 LAB — MAGNESIUM: Magnesium: 2.8 mg/dL — ABNORMAL HIGH (ref 1.7–2.4)

## 2018-11-14 LAB — C-REACTIVE PROTEIN: CRP: 4 mg/dL — ABNORMAL HIGH (ref <1.0)

## 2018-11-14 LAB — D-DIMER, QUANTITATIVE: D-Dimer, Quant: 2.08 ug/mL-FEU — ABNORMAL HIGH (ref 0.00–0.50)

## 2018-11-14 MED ORDER — ADULT MULTIVITAMIN W/MINERALS CH
1.0000 | ORAL_TABLET | Freq: Every day | ORAL | Status: DC
  Administered 2018-11-15 – 2018-11-19 (×2): 1 via ORAL
  Filled 2018-11-14 (×5): qty 1

## 2018-11-14 MED ORDER — POTASSIUM CL IN DEXTROSE 5% 20 MEQ/L IV SOLN
20.0000 meq | INTRAVENOUS | Status: DC
  Administered 2018-11-14 – 2018-11-15 (×3): 20 meq via INTRAVENOUS
  Filled 2018-11-14 (×3): qty 1000

## 2018-11-14 MED ORDER — POTASSIUM CHLORIDE 2 MEQ/ML IV SOLN: INTRAVENOUS | Status: DC

## 2018-11-14 MED ORDER — POTASSIUM CL IN DEXTROSE 5% 20 MEQ/L IV SOLN
20.0000 meq | INTRAVENOUS | Status: DC
  Administered 2018-11-14: 20 meq via INTRAVENOUS
  Filled 2018-11-14: qty 1000

## 2018-11-14 MED ORDER — RESOURCE THICKENUP CLEAR PO POWD
ORAL | Status: DC | PRN
  Filled 2018-11-14: qty 125

## 2018-11-14 MED ORDER — ENSURE ENLIVE PO LIQD: 237.0000 mL | Freq: Two times a day (BID) | ORAL | Status: DC

## 2018-11-14 MED ORDER — METOPROLOL TARTRATE 5 MG/5ML IV SOLN
2.5000 mg | Freq: Three times a day (TID) | INTRAVENOUS | Status: DC
  Administered 2018-11-14 – 2018-11-26 (×26): 2.5 mg via INTRAVENOUS
  Filled 2018-11-14 (×28): qty 5

## 2018-11-14 NOTE — Progress Notes (Addendum)
Daughter [name] called for updates. No answer, voicemail left with callback number  [name] called back and was updated on patient status and plan of care

## 2018-11-14 NOTE — Evaluation (Signed)
Clinical/Bedside Swallow Evaluation Patient Details  Name: Stephen Roth. MRN: [id number] Date of Birth: 04/22/1925  Today's Date: 11/14/2018 Time: SLP Start Time (ACUTE ONLY): 1300 SLP Stop Time (ACUTE ONLY): 1320 SLP Time Calculation (min) (ACUTE ONLY): 20 min  Past Medical History:  Past Medical History:  Diagnosis Date  . Cancer ([place])    basal cell on eyelid  . CHF (congestive heart failure) ([place])   . Cirrhosis ([place])   . Hypertension   . Normal pressure hydrocephalus ([facility])   . Sleep apnea    Past Surgical History:  Past Surgical History:  Procedure Laterality Date  . HERNIA REPAIR    . HIP ARTHROPLASTY Left 01/20/2016   Procedure: ARTHROPLASTY BIPOLAR HIP (HEMIARTHROPLASTY);  Surgeon: Corky Mull, MD;  Location: [facility] ORS;  Service: Orthopedics;  Laterality: Left;  . IMPLANTABLE CARDIOVERTER DEFIBRILLATOR (ICD) GENERATOR CHANGE Left 07/13/2015   Procedure: ICD GENERATOR CHANGE;  Surgeon: Marzetta Board, MD;  Location: [facility] ORS;  Service: Cardiovascular;  Laterality: Left;  . JOINT REPLACEMENT Bilateral    knees  . ORIF FEMUR FRACTURE Left 08/30/2016   Procedure: OPEN REDUCTION INTERNAL FIXATION (ORIF) DISTAL FEMUR FRACTURE;  Surgeon: Hessie Knows, MD;  Location: [facility] ORS;  Service: Orthopedics;  Laterality: Left;  . PACEMAKER INSERTION  2006   Pace maker, defibulator   HPI:  Patient is a 85 y.o. male with PMHx of dementia, chronic systolic heart failure-s/p AICD, cirrhosis, NPH-resident of a skilled nursing facility-presented to [facility] ED on 10/30 with worsening confusion, found to have COVID-19 viral pneumonia, hyponatremia and A. fib with RVR.    Assessment / Plan / Recommendation Clinical Impression  Pt demonstrates impaired swallow function related to weakness and dementia. Pt needs max tactile and hand over hand cueing to recognize spoon/cup/straw and to respond appropriately. There is decreased labial seal and oral control with thin liquids and particularly with  undesired trials. Thin and nectar both result in immediate cough and prolonged throat clearing. Pt had the best response when given honey thick coke (per his constant request for coke) via straw with hand over hand assist while holding a cup as well as verbal cues to recognize method of feeding. Pt was able to seal lips tightly to straw and draw up honey thick liquids with appearance of immediate and consecutive swallow trigger. Despite success today, function may fluctuate. Recommend honey thick liquids and puree though risk of aspiration persists.  SLP Visit Diagnosis: Dysphagia, oropharyngeal phase (R13.12)    Aspiration Risk  Moderate aspiration risk;Risk for inadequate nutrition/hydration    Diet Recommendation Dysphagia 1 (Puree);Honey-thick liquid   Liquid Administration via: Straw Medication Administration: Crushed with puree Compensations: Minimize environmental distractions;Slow rate;Small sips/bites Postural Changes: Seated upright at 90 degrees    Other  Recommendations Other Recommendations: Have oral suction available   Follow up Recommendations Skilled Nursing facility      Frequency and Duration min 2x/week  2 weeks       Prognosis Prognosis for Safe Diet Advancement: Fair Barriers to Reach Goals: Cognitive deficits      Swallow Study   General HPI: Patient is a 85 y.o. male with PMHx of dementia, chronic systolic heart failure-s/p AICD, cirrhosis, NPH-resident of a skilled nursing facility-presented to [facility] ED on 10/30 with worsening confusion, found to have COVID-19 viral pneumonia, hyponatremia and A. fib with RVR.  Type of Study: Bedside Swallow Evaluation Diet Prior to this Study: Regular;Thin liquids Temperature Spikes Noted: No Respiratory Status: Nasal cannula History of Recent Intubation: No  Behavior/Cognition: Alert;Cooperative;Requires cueing;Confused Oral Cavity Assessment: Within Functional Limits(RN reports oral hygiene was very bad but they worked on  it) Oral Care Completed by SLP: No Oral Cavity - Dentition: Adequate natural dentition Self-Feeding Abilities: Total assist Patient Positioning: Upright in bed Baseline Vocal Quality: Low vocal intensity Volitional Cough: Cognitively unable to elicit Volitional Swallow: Unable to elicit    Oral/Motor/Sensory Function     Ice Chips Ice chips: Not tested   Thin Liquid Thin Liquid: Impaired Presentation: Cup;Spoon Oral Phase Impairments: Poor awareness of bolus;Reduced labial seal Pharyngeal  Phase Impairments: Throat Clearing - Immediate;Cough - Immediate    Nectar Thick Nectar Thick Liquid: Impaired Presentation: Cup Pharyngeal Phase Impairments: Cough - Immediate   Honey Thick Honey Thick Liquid: Impaired Presentation: Cup;Straw Oral Phase Impairments: Reduced labial seal;Poor awareness of bolus   Puree Puree: Impaired Pharyngeal Phase Impairments: Cough - Immediate   Solid     Solid: Not tested     Stephen Baltimore, MA CCC-SLP  Acute Rehabilitation Services Pager [phone] Office [phone]  Lynann Beaver 11/14/2018,1:41 PM

## 2018-11-14 NOTE — Progress Notes (Signed)
Initial Nutrition Assessment  INTERVENTION:   -Ensure Enlive po BID, each supplement provides 350 kcal and 20 grams of protein  -MVI with minerals daily.  -Hormel Shake daily with Breakfast which provides 520 kcals and 22 g of protein and Magic cup BID with lunch and dinner, each supplement provides 290 kcal and 9 grams of protein, automatically on meal trays to optimize nutritional intake.  -If intake does not improve, consider TF if within goals of care.  NUTRITION DIAGNOSIS:   Increased nutrient needs related to acute illness as evidenced by estimated needs.  GOAL:   Patient will meet greater than or equal to 90% of their needs  MONITOR:   PO intake, Supplement acceptance, Labs, Weight trends, I & O's, Skin  REASON FOR ASSESSMENT:   Malnutrition Screening Tool    ASSESSMENT:   85 y.o. male with medical history significant of Dementia, NPH, cirrhosis, HTN, CHF. Patient resides in SNF at baseline due to fairly advanced dementia.  10/30: admitted, COVID-19 positive (+ x12 days)  **RD working remotely**  Patient with advanced dementia, AMS, unable to provide any history. Per chart review, pt is not eating and is spitting out medications. SLP to evaluate patient. Per family, do not desire for pt to have aggressive care. Will monitor for plan of care going forward. If within [place], pt will likely benefit from nutrition support.  Per weight records, no weight loss noted. I/Os: +1.8L since admit UOP: 1L x 24 hrs   Medications: D5 w/ KCl infusion Labs reviewed: Elevated Na, Mg  NUTRITION - FOCUSED PHYSICAL EXAM:  Unable to perform -working remotely.  Diet Order:   Diet Order            Diet Heart Room service appropriate? Yes; Fluid consistency: Thin  Diet effective now              EDUCATION NEEDS:   No education needs have been identified at this time  Skin:  Skin Assessment: Skin Integrity Issues: Skin Integrity Issues:: Stage I Stage I: left heel  Last  BM:  11/1 -type 5  Height:   Ht Readings from Last 1 Encounters:  11/13/18 6' (1.829 m)    Weight:   Wt Readings from Last 1 Encounters:  11/13/18 86.2 kg    Ideal Body Weight:  80.9 kg  BMI:  Body mass index is 25.77 kg/m.  Estimated Nutritional Needs:   Kcal:  1950-2150  Protein:  105-120g  Fluid:  2L/day  Clayton Bibles, MS, RD, LDN Inpatient Clinical Dietitian Pager: [phone] After Hours Pager: [phone]

## 2018-11-14 NOTE — Progress Notes (Signed)
Occupational Therapy Evaluation Patient Details Name: Stephen Roth. MRN: [id number] DOB: 1925-12-18 Today's Date: 11/14/2018    History of Present Illness 85 y.o. male with PMHx of dementia, chronic systolic heart failure-s/p AICD, cirrhosis, NPH-resident of a skilled nursing facility-presented to [facility] ED on 10/30 with worsening confusion, found to have COVID-19 viral pneumonia, hyponatremia and A. fib with RVR.   Clinical Impression   PT admitted form [facility], where he is a resident. Pt pleasantly confused during session. Per nursing, pt gets up daily to wc and pushes himself around facility using his feet. Pt will need Maximove to progress OOB. Will follow acutely.     Follow Up Recommendations  SNF;Supervision/Assistance - 24 hour    Equipment Recommendations       Recommendations for Other Services       Precautions / Restrictions Precautions Precautions: Fall Precaution Comments: risk for skin breakdown. DTI on heel per nsg      Mobility Bed Mobility Overal bed mobility: Needs Assistance Bed Mobility: Rolling Rolling: Total assist;+2 for physical assistance         General bed mobility comments: Nsg reports pt is very "squirmy" and needs frequent reopsitioning  Transfers                 General transfer comment: will need Maximove. Does not look like pt stands from lack of ROM in B knees    Balance                                           ADL either performed or assessed with clinical judgement   ADL Overall ADL's : Needs assistance/impaired                                     Functional mobility during ADLs: Total assistance;+2 for physical assistance General ADL Comments: Pt able to use toothette to assist with oral care. Able to wipe mouth. Not alert eough to attempt self feeding this session     Vision         Perception     Praxis      Pertinent Vitals/Pain Pain Assessment: Faces Faces  Pain Scale: Hurts a little bit Pain Location: with ROM BLE Pain Descriptors / Indicators: Grimacing     Hand Dominance Right   Extremity/Trunk Assessment Upper Extremity Assessment Upper Extremity Assessment: Generalized weakness   Lower Extremity Assessment Lower Extremity Assessment: Defer to PT evaluation   Cervical / Trunk Assessment Cervical / Trunk Assessment: Kyphotic;Other exceptions(forward head)   Communication Communication Communication: HOH   Cognition Arousal/Alertness: Lethargic Behavior During Therapy: Flat affect Overall Cognitive Status: No family/caregiver present to determine baseline cognitive functioning                                 General Comments: nsg reports more alert this pm   General Comments  Pt very pleasant adn telling me about his wife "Jossie Ng"    Exercises     Shoulder Instructions      Home Living Family/patient expects to be discharged to:: Skilled nursing facility  Prior Functioning/Environment Level of Independence: Needs assistance        Comments: per nurse, pt in wc daily adn was ableto "pedal" around using both feet on floor. Assist with ADL tasks        OT Problem List: Decreased strength;Decreased range of motion;Decreased activity tolerance;Impaired balance (sitting and/or standing);Decreased safety awareness;Decreased knowledge of use of DME or AE;Cardiopulmonary status limiting activity;Impaired UE functional use      OT Treatment/Interventions: Self-care/ADL training;Therapeutic exercise;Neuromuscular education;Energy conservation;DME and/or AE instruction;Therapeutic activities;Cognitive remediation/compensation;Patient/family education;Balance training    OT Goals(Current goals can be found in the care plan section) Acute Rehab OT Goals Patient Stated Goal: To see his wife Jossie Ng OT Goal Formulation: Patient unable to participate in goal  setting Time For Goal Achievement: 11/28/18 Potential to Achieve Goals: Fair  OT Frequency: Min 2X/week   Barriers to D/C:            Co-evaluation              AM-PAC OT "6 Clicks" Daily Activity     Outcome Measure Help from another person eating meals?: A Lot Help from another person taking care of personal grooming?: A Lot Help from another person toileting, which includes using toliet, bedpan, or urinal?: Total Help from another person bathing (including washing, rinsing, drying)?: Total Help from another person to put on and taking off regular upper body clothing?: Total Help from another person to put on and taking off regular lower body clothing?: Total 6 Click Score: 8   End of Session Equipment Utilized During Treatment: Oxygen Nurse Communication: Mobility status;Need for lift equipment  Activity Tolerance: Patient tolerated treatment well Patient left: in bed;with restraints reapplied(chair position)  OT Visit Diagnosis: Unsteadiness on feet (R26.81);Other abnormalities of gait and mobility (R26.89);Muscle weakness (generalized) (M62.81);Other symptoms and signs involving cognitive function                Time: 1500-1520 OT Time Calculation (min): 20 min Charges:  OT General Charges $OT Visit: 1 Visit OT Evaluation $OT Eval Moderate Complexity: [place], OT/L   Acute OT Clinical Specialist Acute Rehabilitation Services Pager [phone] Office [phone]   [facility] 11/14/2018, 3:39 PM

## 2018-11-14 NOTE — Progress Notes (Signed)
Had patient facetime with wife and daughter.  Pt smiling and very happy to see wife.  SLP completed consult and will order diet appropriate for patient.

## 2018-11-14 NOTE — Consult Note (Signed)
[place] Nurse wound consult note Reason for Consult: Left heel area of non blanching erythema (Stage 1 pressure injury) Wound type:Pressure Pressure Injury POA: Yes Measurement: Bedside RN to obtain measurements and document in the flow sheet today Wound bed:N/A Drainage (amount, consistency, odor) N/A  Periwound: intact, dry Dressing procedure/placement/frequency: I have provided Nursing with guidance via the Orders for the care of a Stage 1 Pressure injury to the heel:  Provided bilateral pressure redistribution heel boots, guidance for the placement of silicone foam dressings to the heels with every 3 day changes.  [place] nursing team will not follow, but will remain available to this patient, the nursing and medical teams.  Please re-consult if needed. Thanks, Maudie Flakes, MSN, RN, [place], Arther Abbott  Pager# [phone]

## 2018-11-14 NOTE — Plan of Care (Signed)
  Problem: Education: Goal: Knowledge of risk factors and measures for prevention of condition will improve Outcome: Progressing   Problem: Coping: Goal: Psychosocial and spiritual needs will be supported Outcome: Progressing   Problem: Respiratory: Goal: Will maintain a patent airway Outcome: Progressing Goal: Complications related to the disease process, condition or treatment will be avoided or minimized Outcome: Progressing   Problem: Activity: Goal: Ability to tolerate increased activity will improve Outcome: Progressing   Problem: Clinical Measurements: Goal: Ability to maintain a body temperature in the normal range will improve Outcome: Progressing   Problem: Respiratory: Goal: Ability to maintain adequate ventilation will improve Outcome: Progressing Goal: Ability to maintain a clear airway will improve Outcome: Progressing   Problem: Education: Goal: Knowledge of General Education information will improve Description: Including pain rating scale, medication(s)/side effects and non-pharmacologic comfort measures Outcome: Progressing   Problem: Health Behavior/Discharge Planning: Goal: Ability to manage health-related needs will improve Outcome: Progressing   Problem: Clinical Measurements: Goal: Ability to maintain clinical measurements within normal limits will improve Outcome: Progressing Goal: Will remain free from infection Outcome: Progressing Goal: Diagnostic test results will improve Outcome: Progressing Goal: Respiratory complications will improve Outcome: Progressing Goal: Cardiovascular complication will be avoided Outcome: Progressing   Problem: Activity: Goal: Risk for activity intolerance will decrease Outcome: Progressing   Problem: Nutrition: Goal: Adequate nutrition will be maintained Outcome: Progressing   Problem: Coping: Goal: Level of anxiety will decrease Outcome: Progressing   Problem: Elimination: Goal: Will not experience  complications related to bowel motility Outcome: Progressing Goal: Will not experience complications related to urinary retention Outcome: Progressing   Problem: Pain Managment: Goal: General experience of comfort will improve Outcome: Progressing   Problem: Safety: Goal: Ability to remain free from injury will improve Outcome: Progressing   Problem: Skin Integrity: Goal: Risk for impaired skin integrity will decrease Outcome: Progressing   

## 2018-11-14 NOTE — Progress Notes (Signed)
Stephen Roth, daughter, called and made aware of current situation and updates given.  Questions answered.

## 2018-11-14 NOTE — Progress Notes (Signed)
PROGRESS NOTE                                                                                                                                                                                                             Patient Demographics:    Stephen Roth, is a 85 y.o. male, DOB - [birth year], [id number]  Outpatient Primary MD for the patient is Adin Hector, MD   Admit date - 11/12/2018   LOS - 2  No chief complaint on file.      Brief Narrative: Patient is a 85 y.o. male with PMHx of dementia, chronic systolic heart failure-s/p AICD, cirrhosis, NPH-resident of a skilled nursing facility-presented to [facility] ED on 10/30 with worsening confusion, found to have COVID-19 viral pneumonia, hyponatremia and A. fib with RVR.  See below for further details.  Per ED note at [facility]-patient was diagnosed with COVID-19 at [facility] skilled nursing facility in [place] approximately 12 days prior to admission.   Subjective:    Stephen Roth today is more alert but still confused.  He was able to repeat a few of my words today.  Per nursing staff-spitting out pills-not eating at all.   Assessment  & Plan :   Covid 19 Viral pneumonia: Appears stable-CRP downtrending-continue remdesivir..  Continue remdesivir.  Due to lack of significant hypoxemia-continue to hold off on steroids-he is still confused/encephalopathic-steroids may worsen encephalopathy.  Fever: afebrile  O2 requirements: RA  COVID-19 Labs: Recent Labs    11/12/18 1252 11/12/18 1443 11/13/18 0648 11/14/18 0054  DDIMER  --   --  2.81* 2.08*  FERRITIN 443*  --   --   --   LDH 275*  --   --   --   CRP  --  7.1* 5.9* 4.0*    No results found for: SARSCOV2NAA   COVID-19 Medications: Steroids: Not started Remdesivir: 10/31>> Actemra: Not indicated Convalescent Plasma: Not indicated  research Studies:N/A  Other medications: Diuretics:Euvolemic-no  need for lasix Antibiotics:Not needed as no evidence of bacterial infection  Prone/Incentive Spirometry: Unable to perform due to severe dementia.  DVT Prophylaxis  : Starting Lovenox-stopping heparin GTT  Acute metabolic encephalopathy on top of advanced dementia: Acute metabolic encephalopathy likely multifactorial-secondary to COVID-19 infection, hyponatremia and AKI-still appears to be very confused-not sure how far from his usual baseline.  CT  head on admission negative-continue supportive care.  Hypernatremia: Secondary to poor oral intake-in the setting of COVID-19 encephalopathy/infection-sodium levels improving with supportive care/gentle hydration  AKI: Likely hemodynamically mediated-creatinine is improving with gentle hydration.  A. fib with RVR: Back in sinus rhythm-we will ask RN to titrate off the Cardizem infusion-his oral intake is still very poor-we will put him on IV Lopressor scheduled dosing till his oral intake improves.  Spoke at length with patient's granddaughter yesterday (see my notes)-not a good candidate for long-term anticoagulation-remains on aspirin.    Chronic systolic heart failure: Remains compensated-watch closely while on IV fluids.  Hold diuretics given hypernatremia and AKI.  Dementia: Appears to be advanced resume Namenda once oral intake is slightly better.  NPH: Stable for outpatient follow-up with outpatient MDs  Reported history of liver cirrhosis: Compensated-stable for outpatient follow-up  Failure to thrive: Very weak/debilitated at baseline-now has encephalopathy from Covid and electrolyte abnormalities.  Per nursing staff-no oral intake at all-await SLP evaluation.  Hopeful that oral intake will improve with supportive care and further improvement in electrolytes.  Palliative care: DNR in place.  Spoke with patient's daughter-who asked me to speak with patient's granddaughter Dr. Josephine Cables who is a hospitalist with the [facility] system.   Goals of care are for gentle medical treatment-family does not desire aggressive care.  ABG: No results found for: PHART, PCO2ART, PO2ART, HCO3, TCO2, ACIDBASEDEF, O2SAT  Vent Settings: N/A  Condition - Extremely Guarded  Family Communication  : Spokewith Dr. [name] [phone] granddaughter.  Code Status :  DNR  Diet :  Diet Order            Diet Heart Room service appropriate? Yes; Fluid consistency: Thin  Diet effective now               Disposition Plan  :  Remain hospitalized  Barriers to discharge: Hypoxia requiring O2 supplementation/complete 5 days of IV Remdesivir  Consults  :  None  Procedures  :  None  Antibiotics  :    Anti-infectives (From admission, onward)   Start     Dose/Rate Route Frequency Ordered Stop   11/14/18 1000  remdesivir 100 mg in sodium chloride 0.9 % 250 mL IVPB     100 mg 500 mL/hr over 30 Minutes Intravenous Every 24 hours 11/13/18 0715 11/18/18 0959   11/13/18 1000  remdesivir 200 mg in sodium chloride 0.9 % 250 mL IVPB     200 mg 500 mL/hr over 30 Minutes Intravenous Once 11/13/18 0715 11/13/18 1340      Inpatient Medications  Scheduled Meds: . aspirin  81 mg Oral Daily  . dutasteride  0.5 mg Oral Daily  . enoxaparin (LOVENOX) injection  40 mg Subcutaneous Q24H  . memantine  10 mg Oral BID  . nystatin  1 g Topical BID   Continuous Infusions: . dextrose 5 % with KCl 20 mEq / L 20 mEq (11/14/18 0938)  . diltiazem (CARDIZEM) infusion 7.5 mg/hr (11/14/18 0658)  . remdesivir 100 mg in NS 250 mL 100 mg (11/14/18 1052)   PRN Meds:.acetaminophen, albuterol, ondansetron **OR** ondansetron (ZOFRAN) IV   Time Spent in minutes  25  See all Orders from today for further details   Oren Binet M.D on 11/14/2018 at 11:29 AM  To page go to [email or web address] - use universal password  [facility] -  Office  [phone]    Objective:   Vitals:   11/14/18 0700 11/14/18 0708 11/14/18 1100 11/14/18 1112   BP:  102/66 128/84 (!) 132/96  Pulse:  (!) 43 64 (!) 101  Resp:  16 20 16   Temp:  98.1 F (36.7 C)  97.9 F (36.6 C)  TempSrc:  Axillary  Axillary  SpO2: 92% 94% 94% 92%  Weight:      Height:        Wt Readings from Last 3 Encounters:  11/13/18 86.2 kg  11/12/18 86.2 kg  11/19/17 79.1 kg     Intake/Output Summary (Last 24 hours) at 11/14/2018 1129 Last data filed at 11/14/2018 0600 Gross per 24 hour  Intake 2819.26 ml  Output 1000 ml  Net 1819.26 ml     Physical Exam Gen Exam: More awake-but remains very confused HEENT:atraumatic, normocephalic Chest: B/L clear to auscultation anteriorly CVS:S1S2 regular Abdomen:soft non tender, non distended Extremities:no edema Neurology: Difficult exam but moving all 4 extremities.   Skin: no rash   Data Review:    CBC Recent Labs  Lab 11/12/18 1252 11/13/18 0648 11/14/18 0054  WBC 9.9 8.5 12.9*  HGB 19.6* 18.3* 17.5*  HCT 60.2* 55.1* 54.2*  PLT 257 242 264  MCV 96.3 96.2 97.3  MCH 31.4 31.9 31.4  MCHC 32.6 33.2 32.3  RDW 12.9 13.0 12.9  LYMPHSABS 1.0 0.5* 0.7  MONOABS 0.9 0.6 1.1*  EOSABS 0.0 0.0 0.0  BASOSABS 0.0 0.0 0.0    Chemistries  Recent Labs  Lab 11/12/18 1252 11/12/18 2345 11/13/18 0648 11/13/18 1327 11/13/18 1851 11/14/18 0054 11/14/18 0230  NA 154* 154* 150* 151* 150* 151*  --   K 3.9 3.7 3.5 3.4* 3.2* 3.6  --   CL 109 111 111 113* 111 111  --   CO2 28 29 26 25 27 26   --   GLUCOSE 140* 170* 210* 155* 153* 127*  --   BUN 57* 59* 62* 63* 68* 62*  --   CREATININE 1.68* 1.90* 1.60* 1.66* 1.59* 1.52*  --   CALCIUM 9.9 9.5 9.1 8.8* 8.9 9.0  --   MG  --   --   --   --   --   --  2.8*  AST 71*  --  58*  --   --  72*  --   ALT 37  --  35  --   --  46*  --   ALKPHOS 84  --  76  --   --  76  --   BILITOT 1.7*  --  2.0*  --   --  1.3*  --    ------------------------------------------------------------------------------------------------------------------ Recent Labs    11/12/18 1252  TRIG 125     No results found for: HGBA1C ------------------------------------------------------------------------------------------------------------------ No results for input(s): TSH, T4TOTAL, T3FREE, THYROIDAB in the last 72 hours.  Invalid input(s): FREET3 ------------------------------------------------------------------------------------------------------------------ Recent Labs    11/12/18 1252  FERRITIN 443*    Coagulation profile No results for input(s): INR, PROTIME in the last 168 hours.  Recent Labs    11/13/18 0648 11/14/18 0054  DDIMER 2.81* 2.08*    Cardiac Enzymes No results for input(s): CKMB, TROPONINI, MYOGLOBIN in the last 168 hours.  Invalid input(s): CK ------------------------------------------------------------------------------------------------------------------    Component Value Date/Time   BNP 121.0 (H) 11/12/2018 1252    Micro Results Recent Results (from the past 240 hour(s))  Blood Culture (routine x 2)     Status: None (Preliminary result)   Collection Time: 11/12/18 12:30 PM   Specimen: BLOOD  Result Value Ref Range Status   Specimen Description BLOOD BLOOD LEFT FOREARM  Final  Special Requests   Final    BOTTLES DRAWN AEROBIC AND ANAEROBIC Blood Culture results may not be optimal due to an inadequate volume of blood received in culture bottles   Culture   Final    NO GROWTH 2 DAYS Performed at [facility], [address], [place], [place] [address]    Report Status PENDING  Incomplete  Blood Culture (routine x 2)     Status: None (Preliminary result)   Collection Time: 11/12/18 12:52 PM   Specimen: BLOOD  Result Value Ref Range Status   Specimen Description BLOOD BLOOD RIGHT FOREARM  Final   Special Requests   Final    BOTTLES DRAWN AEROBIC AND ANAEROBIC Blood Culture adequate volume   Culture   Final    NO GROWTH 2 DAYS Performed at [facility], [address], [place], [place] [address]    Report Status  PENDING  Incomplete  MRSA PCR Screening     Status: None   Collection Time: 11/13/18  2:20 AM   Specimen: Nasopharyngeal  Result Value Ref Range Status   MRSA by PCR NEGATIVE NEGATIVE Final    Comment:        The GeneXpert MRSA Assay (FDA approved for NASAL specimens only), is one component of a comprehensive MRSA colonization surveillance program. It is not intended to diagnose MRSA infection nor to guide or monitor treatment for MRSA infections. Performed at [facility], [place] [address] [place], [place] [address]     Radiology Reports Ct Head Wo Contrast  Result Date: 11/13/2018 CLINICAL DATA:  Encephalopathy EXAM: CT HEAD WITHOUT CONTRAST TECHNIQUE: Contiguous axial images were obtained from the base of the skull through the vertex without intravenous contrast. COMPARISON:  11/18/2017 FINDINGS: Brain: There is no mass, hemorrhage or extra-axial collection. There is generalized atrophy without lobar predilection. Hypodensity of the white matter is most commonly associated with chronic microvascular disease. Vascular: Atherosclerotic calcification of the internal carotid arteries at the skull base. No abnormal hyperdensity of the major intracranial arteries or dural venous sinuses. Skull: The visualized skull base, calvarium and extracranial soft tissues are normal. Sinuses/Orbits: No fluid levels or advanced mucosal thickening of the visualized paranasal sinuses. No mastoid or middle ear effusion. The orbits are normal. IMPRESSION: Severe atrophy and chronic ischemic white matter disease. No acute abnormality. Electronically Signed   By: Ulyses Jarred M.D.   On: 11/13/2018 02:48   Dg Chest Port 1 View  Result Date: 11/12/2018 CLINICAL DATA:  Altered mental status. COVID-19 positive last week. Tachycardia. EXAM: PORTABLE CHEST 1 VIEW COMPARISON:  11/18/2017 FINDINGS: Left-sided pacemaker unchanged. Lungs are somewhat hypoinflated with mild patchy airspace  opacification over the mid to lower lungs suggesting multifocal infection. No effusion. Borderline stable cardiomegaly. Remainder of the exam is unchanged. IMPRESSION: Hypoinflation with mild patchy airspace process over the mid to lower lungs likely multifocal infection. Electronically Signed   By: Marin Olp M.D.   On: 11/12/2018 13:07

## 2018-11-14 NOTE — Plan of Care (Signed)
  Problem: Education: Goal: Knowledge of risk factors and measures for prevention of condition will improve Outcome: Progressing   Problem: Coping: Goal: Psychosocial and spiritual needs will be supported Outcome: Progressing   Problem: Respiratory: Goal: Will maintain a patent airway Outcome: Progressing Goal: Complications related to the disease process, condition or treatment will be avoided or minimized Outcome: Progressing   Problem: Activity: Goal: Ability to tolerate increased activity will improve Outcome: Progressing   Problem: Clinical Measurements: Goal: Ability to maintain a body temperature in the normal range will improve Outcome: Progressing   Problem: Respiratory: Goal: Ability to maintain adequate ventilation will improve Outcome: Progressing Goal: Ability to maintain a clear airway will improve Outcome: Progressing   Problem: Health Behavior/Discharge Planning: Goal: Ability to manage health-related needs will improve Outcome: Progressing   Problem: Clinical Measurements: Goal: Ability to maintain clinical measurements within normal limits will improve Outcome: Progressing Goal: Will remain free from infection Outcome: Progressing Goal: Diagnostic test results will improve Outcome: Progressing Goal: Respiratory complications will improve Outcome: Progressing Goal: Cardiovascular complication will be avoided Outcome: Progressing   Problem: Coping: Goal: Level of anxiety will decrease Outcome: Progressing   Problem: Elimination: Goal: Will not experience complications related to bowel motility Outcome: Progressing Goal: Will not experience complications related to urinary retention Outcome: Progressing   Problem: Pain Managment: Goal: General experience of comfort will improve Outcome: Progressing   Problem: Safety: Goal: Ability to remain free from injury will improve Outcome: Progressing   Problem: Skin Integrity: Goal: Risk for  impaired skin integrity will decrease Outcome: Progressing

## 2018-11-15 DIAGNOSIS — L89629 Pressure ulcer of left heel, unspecified stage: Secondary | ICD-10-CM

## 2018-11-15 LAB — CBC WITH DIFFERENTIAL/PLATELET
Abs Immature Granulocytes: 0.06 10*3/uL (ref 0.00–0.07)
Basophils Absolute: 0 10*3/uL (ref 0.0–0.1)
Basophils Relative: 0 %
Eosinophils Absolute: 0 10*3/uL (ref 0.0–0.5)
Eosinophils Relative: 0 %
HCT: 53.5 % — ABNORMAL HIGH (ref 39.0–52.0)
Hemoglobin: 17.3 g/dL — ABNORMAL HIGH (ref 13.0–17.0)
Immature Granulocytes: 1 %
Lymphocytes Relative: 9 %
Lymphs Abs: 0.9 10*3/uL (ref 0.7–4.0)
MCH: 31.6 pg (ref 26.0–34.0)
MCHC: 32.3 g/dL (ref 30.0–36.0)
MCV: 97.8 fL (ref 80.0–100.0)
Monocytes Absolute: 1.2 10*3/uL — ABNORMAL HIGH (ref 0.1–1.0)
Monocytes Relative: 12 %
Neutro Abs: 8.2 10*3/uL — ABNORMAL HIGH (ref 1.7–7.7)
Neutrophils Relative %: 78 %
Platelets: 243 10*3/uL (ref 150–400)
RBC: 5.47 MIL/uL (ref 4.22–5.81)
RDW: 13 % (ref 11.5–15.5)
WBC: 10.4 10*3/uL (ref 4.0–10.5)
nRBC: 0 % (ref 0.0–0.2)

## 2018-11-15 LAB — COMPREHENSIVE METABOLIC PANEL
ALT: 47 U/L — ABNORMAL HIGH (ref 0–44)
AST: 59 U/L — ABNORMAL HIGH (ref 15–41)
Albumin: 3.1 g/dL — ABNORMAL LOW (ref 3.5–5.0)
Alkaline Phosphatase: 74 U/L (ref 38–126)
Anion gap: 12 (ref 5–15)
BUN: 51 mg/dL — ABNORMAL HIGH (ref 8–23)
CO2: 26 mmol/L (ref 22–32)
Calcium: 9 mg/dL (ref 8.9–10.3)
Chloride: 111 mmol/L (ref 98–111)
Creatinine, Ser: 1.36 mg/dL — ABNORMAL HIGH (ref 0.61–1.24)
GFR calc Af Amer: 52 mL/min — ABNORMAL LOW (ref >60)
GFR calc non Af Amer: 45 mL/min — ABNORMAL LOW (ref >60)
Glucose, Bld: 93 mg/dL (ref 70–99)
Potassium: 3.9 mmol/L (ref 3.5–5.1)
Sodium: 149 mmol/L — ABNORMAL HIGH (ref 135–145)
Total Bilirubin: 1.3 mg/dL — ABNORMAL HIGH (ref 0.3–1.2)
Total Protein: 6.4 g/dL — ABNORMAL LOW (ref 6.5–8.1)

## 2018-11-15 LAB — D-DIMER, QUANTITATIVE: D-Dimer, Quant: 2.33 ug/mL-FEU — ABNORMAL HIGH (ref 0.00–0.50)

## 2018-11-15 LAB — FERRITIN: Ferritin: 345 ng/mL — ABNORMAL HIGH (ref 24–336)

## 2018-11-15 LAB — C-REACTIVE PROTEIN: CRP: 2.2 mg/dL — ABNORMAL HIGH (ref <1.0)

## 2018-11-15 MED ORDER — DILTIAZEM HCL ER COATED BEADS 120 MG PO CP24
120.0000 mg | ORAL_CAPSULE | Freq: Every day | ORAL | Status: DC
  Administered 2018-11-15 – 2018-11-19 (×3): 120 mg via ORAL
  Filled 2018-11-15 (×5): qty 1

## 2018-11-15 MED ORDER — MIRTAZAPINE 15 MG PO TABS
15.0000 mg | ORAL_TABLET | Freq: Every day | ORAL | Status: DC
  Administered 2018-11-15 – 2018-11-23 (×8): 15 mg via ORAL
  Filled 2018-11-15 (×7): qty 1

## 2018-11-15 NOTE — Progress Notes (Signed)
Alison,pt's daughter called to speak with pt's nurse. Educated Bryson Ha on primary contact being the person who gets updates. Bryson Ha stated that she had her mother with her, who is Mrs Hersh. Mrs [place] is not listed as a contact at this time. Bryson Ha is requesting a facetime call back from the nurse so that Mrs Louis can see the pt. Primary RN aware to call family back.

## 2018-11-15 NOTE — Progress Notes (Signed)
Occupational Therapy Treatment Patient Details Name: Stephen Roth. MRN: [id number] DOB: 11/23/1925 Today's Date: 11/15/2018    History of present illness 85 y.o. male with PMHx of dementia, chronic systolic heart failure-s/p AICD, cirrhosis, NPH-resident of a skilled nursing facility-presented to [facility] ED on 10/30 with worsening confusion, found to have COVID-19 viral pneumonia, hyponatremia and A. fib with RVR.   OT comments  Pt lethargic this session and not following commands. Change in level of arousal as compared to yesterday's session. Pt was interactive and pleasant yesterday with good eye contact. Nsg made aware. HR into 140s with SpO2 desat into 80s with poor pleth when cleaning pt in bed on RA. Total A +2 for all mobility. Level of arousal did not increase with increased mobility or upright posture. Will follow acutely.   Follow Up Recommendations  SNF;Supervision/Assistance - 24 hour    Equipment Recommendations       Recommendations for Other Services      Precautions / Restrictions Precautions Precautions: Fall Precaution Comments: risk for skin breakdown. DTI on heel per nsg       Mobility Bed Mobility Overal bed mobility: Needs Assistance Bed Mobility: Rolling Rolling: Total assist;+2 for physical assistance         General bed mobility comments: holding bed rail with tight grip - phsical assist to have pt to release rail  Transfers                 General transfer comment: will need maximove; left in chair position in bed    Balance                                           ADL either performed or assessed with clinical judgement   ADL Overall ADL's : Needs assistance/impaired                                       General ADL Comments: total A today; taking cloth to wash over head but onot on command     Vision       Perception     Praxis      Cognition Arousal/Alertness: Lethargic Behavior  During Therapy: Restless;Flat affect Overall Cognitive Status: Impaired/Different from baseline Area of Impairment: Orientation;Attention;Memory;Following commands;Safety/judgement;Awareness;Problem solving                               General Comments: not following commands; eyes closed majority of session; lethargic; more restless; condom cath and IV pulled out.        Exercises Exercises: Other exercises Other Exercises Other Exercises: unable to follow commands to use flutter/IS   Shoulder Instructions       General Comments      Pertinent Vitals/ Pain       Pain Assessment: Faces Faces Pain Scale: Hurts little more Pain Location: grimacing, unable to localize Pain Descriptors / Indicators: Grimacing Pain Intervention(s): Limited activity within patient's tolerance  Home Living                                          Prior Functioning/Environment  Frequency  Min 2X/week        Progress Toward Goals  OT Goals(current goals can now be found in the care plan section)  Progress towards OT goals: Not progressing toward goals - comment(due to level of arousal)  Acute Rehab OT Goals Patient Stated Goal: unable to state OT Goal Formulation: Patient unable to participate in goal setting Time For Goal Achievement: 11/28/18 Potential to Achieve Goals: [place] Discharge plan remains appropriate    Co-evaluation                 AM-PAC OT "6 Clicks" Daily Activity     Outcome Measure   Help from another person eating meals?: Total Help from another person taking care of personal grooming?: Total Help from another person toileting, which includes using toliet, bedpan, or urinal?: Total Help from another person bathing (including washing, rinsing, drying)?: Total Help from another person to put on and taking off regular upper body clothing?: Total Help from another person to put on and taking off regular  lower body clothing?: Total 6 Click Score: 6    End of Session Equipment Utilized During Treatment: Oxygen(2L)  OT Visit Diagnosis: Unsteadiness on feet (R26.81);Other abnormalities of gait and mobility (R26.89);Muscle weakness (generalized) (M62.81);Other symptoms and signs involving cognitive function   Activity Tolerance Patient limited by lethargy   Patient Left in bed;with call bell/phone within reach;with bed alarm set(chair position)   Nurse Communication Mobility status;Other (comment)(IV pulled out on entry to room)        Time: [id number] OT Time Calculation (min): 27 min  Charges: OT General Charges $OT Visit: 1 Visit OT Treatments $Self Care/Home Management : 8-22 mins  Maurie Boettcher, OT/L   Acute OT Clinical Specialist [place] Pager [phone] Office [phone]    [facility] 11/15/2018, 3:20 PM

## 2018-11-15 NOTE — Plan of Care (Addendum)
Spoke with daughter to give update and current status of dad. Also spoke with other Daughter/Allison on her [place] Parents [phone] to help with facetime with mom/patinet's wife. No further questions at this time.  PT and Dr. Also asked to contact, per their request.

## 2018-11-15 NOTE — Plan of Care (Signed)
Patient continues to take very little PO. Even medications crushed and placed with ice cream are often spat and the patient also bites the spoon.  All 3 meals were attempted, but less than 5 small bites were even partially successful.

## 2018-11-15 NOTE — Progress Notes (Signed)
  Speech Language Pathology Treatment: Dysphagia  Patient Details Name: Stephen Roth. MRN: [id number] DOB: 1925-03-04 Today's Date: 11/15/2018 Time: [id number] SLP Time Calculation (min) (ACUTE ONLY): 13 min  Assessment / Plan / Recommendation Clinical Impression  Pt's intake is limited during session but also overall per discussion with RN. Attempted to engage pt in self-feeding via straw but pt just chews on the straw. Honey thick liquids and purees were provided via spoon with slow oral transit and intermittent holding of purees. Intermittent, delayed throat clearing was noted but no overt coughing. Discussed strategies with RN and likelihood for fluctuating performance. Would continue current diet (unless family elects for liberalized diet for comfort) with careful assistance and use of aspiration precautions.    HPI HPI: Patient is a 85 y.o. male with PMHx of dementia, chronic systolic heart failure-s/p AICD, cirrhosis, NPH-resident of a skilled nursing facility-presented to [facility] ED on 10/30 with worsening confusion, found to have COVID-19 viral pneumonia, hyponatremia and A. fib with RVR.       SLP Plan  Continue with current plan of care       Recommendations  Diet recommendations: Dysphagia 1 (puree);Honey-thick liquid Liquids provided via: Teaspoon;Straw Medication Administration: Crushed with puree Supervision: Staff to assist with self feeding;Full supervision/cueing for compensatory strategies Compensations: Minimize environmental distractions;Slow rate;Small sips/bites Postural Changes and/or Swallow Maneuvers: Seated upright 90 degrees;Upright 30-60 min after meal                Oral Care Recommendations: Oral care QID Follow up Recommendations: Skilled Nursing facility SLP Visit Diagnosis: Dysphagia, oropharyngeal phase (R13.12) Plan: Continue with current plan of care       GO                Venita Sheffield Tevis Dunavan 11/15/2018, 3:00 PM  Pollyann Glen, M.A.  [place] Acute Environmental education officer [phone] Office [phone]

## 2018-11-15 NOTE — Plan of Care (Signed)
  Problem: Education: Goal: Knowledge of risk factors and measures for prevention of condition will improve Outcome: Progressing   Problem: Coping: Goal: Psychosocial and spiritual needs will be supported Outcome: Progressing   Problem: Respiratory: Goal: Will maintain a patent airway Outcome: Progressing Goal: Complications related to the disease process, condition or treatment will be avoided or minimized Outcome: Progressing   Problem: Activity: Goal: Ability to tolerate increased activity will improve Outcome: Progressing   Problem: Clinical Measurements: Goal: Ability to maintain a body temperature in the normal range will improve Outcome: Progressing   Problem: Respiratory: Goal: Ability to maintain adequate ventilation will improve Outcome: Progressing Goal: Ability to maintain a clear airway will improve Outcome: Progressing   Problem: Education: Goal: Knowledge of General Education information will improve Description: Including pain rating scale, medication(s)/side effects and non-pharmacologic comfort measures Outcome: Progressing   Problem: Health Behavior/Discharge Planning: Goal: Ability to manage health-related needs will improve Outcome: Progressing   Problem: Clinical Measurements: Goal: Ability to maintain clinical measurements within normal limits will improve Outcome: Progressing Goal: Will remain free from infection Outcome: Progressing Goal: Diagnostic test results will improve Outcome: Progressing Goal: Respiratory complications will improve Outcome: Progressing Goal: Cardiovascular complication will be avoided Outcome: Progressing   Problem: Activity: Goal: Risk for activity intolerance will decrease Outcome: Progressing   Problem: Coping: Goal: Level of anxiety will decrease Outcome: Progressing   Problem: Elimination: Goal: Will not experience complications related to bowel motility Outcome: Progressing Goal: Will not experience  complications related to urinary retention Outcome: Progressing   Problem: Pain Managment: Goal: General experience of comfort will improve Outcome: Progressing   Problem: Safety: Goal: Ability to remain free from injury will improve Outcome: Progressing   Problem: Skin Integrity: Goal: Risk for impaired skin integrity will decrease Outcome: Progressing

## 2018-11-15 NOTE — Evaluation (Addendum)
Physical Therapy Evaluation Patient Details Name: Stephen Roth. MRN: [id number] DOB: [birth year] Today's Date: 11/15/2018   History of Present Illness  Pt is a 85 y.o. male SNF resident admitted to [facility] ED on 11/12/18 with worsening confusion, found to have COVID-19 viral pneumonia, hyponatremia and A. fib with RVR. PMH includes dementia, chronic systolic heart failure s/p AICD, cirrhosis, NPH.    Clinical Impression  Pt presents with an overall decrease in functional mobility secondary to above. PTA, pt resident at [facility]; per daughter, pt was indep with bed mobility, able to transfer to w/c with standby assist and propel self using feet, required assistance for ADLs. Today, pt restless but lethargic with eyes closed majority of session, requiring totalA+2 for mobility; level of arousal did not increase with mobility. Provided daughter [name]) update via phone call. Pt would benefit from continued acute PT services to maximize functional mobility and independence prior to d/c with SNF-level therapies.   HR up to 140s, SpO2 down to 80s (poor pleth) on RA with bed mobility; RN aware, O2 replaced    Follow Up Recommendations SNF;Supervision/Assistance - 24 hour    Equipment Recommendations  (defer to next venue)    Recommendations for Other Services       Precautions / Restrictions Precautions Precautions: Fall Precaution Comments: risk for skin breakdown. DTI on heel per nsg Restrictions Weight Bearing Restrictions: No      Mobility  Bed Mobility Overal bed mobility: Needs Assistance Bed Mobility: Rolling Rolling: Total assist;+2 for physical assistance         General bed mobility comments: holding bed rail with tight grip - physical assist to have pt release rail. Bed left in chair position  Transfers                 General transfer comment: will need maximove  Ambulation/Gait                Stairs            Wheelchair Mobility     Modified Rankin (Stroke Patients Only)       Balance                                             Pertinent Vitals/Pain Pain Assessment: Faces Faces Pain Scale: Hurts little more Pain Location: grimacing, unable to localize Pain Descriptors / Indicators: Grimacing Pain Intervention(s): Limited activity within patient's tolerance    Home Living Family/patient expects to be discharged to:: Skilled nursing facility                 Additional Comments: Daughter [name]) is a PT    Prior Function Level of Independence: Needs assistance   Gait / Transfers Assistance Needed: Per daughter, indep with bed mobility, able to transfer to w/c with standby assist; mostly indep with w/c mobility by using feet to "pedal" on ground  ADL's / Homemaking Assistance Needed: Assist for ADLs, more related to cognition than physical ability        Hand Dominance   Dominant Hand: Right    Extremity/Trunk Assessment   Upper Extremity Assessment Upper Extremity Assessment: Generalized weakness    Lower Extremity Assessment Lower Extremity Assessment: Generalized weakness    Cervical / Trunk Assessment Cervical / Trunk Assessment: Kyphotic  Communication   Communication: HOH  Cognition Arousal/Alertness: Lethargic Behavior During Therapy: Restless;Flat  affect Overall Cognitive Status: Impaired/Different from baseline Area of Impairment: Orientation;Attention;Memory;Following commands;Safety/judgement;Awareness;Problem solving                               General Comments: not following commands; eyes closed majority of session; lethargic; more restless; condom cath and IV pulled out.      General Comments General comments (skin integrity, edema, etc.): Spoke with daughter [name]) on phone to provide update and got info related to [facility]    Exercises Other Exercises Other Exercises: unable to follow commands to use flutter/IS   Assessment/Plan     PT Assessment Patient needs continued PT services  PT Problem List Decreased strength;Decreased activity tolerance;Decreased balance;Decreased mobility;Decreased cognition;Decreased knowledge of use of DME;Decreased safety awareness;Cardiopulmonary status limiting activity       PT Treatment Interventions DME instruction;Gait training;Functional mobility training;Therapeutic activities;Therapeutic exercise;Balance training;Patient/family education;Wheelchair mobility training    PT Goals (Current goals can be found in the Care Plan section)  Acute Rehab PT Goals Patient Stated Goal: unable to state    Frequency Min 2X/week   Barriers to discharge        Co-evaluation PT/OT/SLP Co-Evaluation/Treatment: Yes Reason for Co-Treatment: For patient/therapist safety PT goals addressed during session: Mobility/safety with mobility         AM-PAC PT "6 Clicks" Mobility  Outcome Measure Help needed turning from your back to your side while in a flat bed without using bedrails?: Total Help needed moving from lying on your back to sitting on the side of a flat bed without using bedrails?: Total Help needed moving to and from a bed to a chair (including a wheelchair)?: Total Help needed standing up from a chair using your arms (e.g., wheelchair or bedside chair)?: Total Help needed to walk in hospital room?: Total Help needed climbing 3-5 steps with a railing? : Total 6 Click Score: 6    End of Session   Activity Tolerance: Patient limited by lethargy Patient left: in bed;with call bell/phone within reach;with bed alarm set Nurse Communication: Mobility status;Need for lift equipment(bilateral soft mitts reapplied per RN request, pt had removed IV and condom cath) PT Visit Diagnosis: Other abnormalities of gait and mobility (R26.89);Muscle weakness (generalized) (M62.81)    Time: [id number] PT Time Calculation (min) (ACUTE ONLY): 27 min   Charges:   PT Evaluation $PT Eval  Moderate Complexity: [place], PT, DPT Acute Rehabilitation Services  Pager [phone] Office [phone]  Derry Lory 11/15/2018, 3:53 PM

## 2018-11-15 NOTE — Consult Note (Signed)
[place] Nurse wound consult note Reason for Consult: Night shift RN communicated via secure Chat that the heel nonblanchable area is consistent with DTPI rather than Stage 1.  Agreed that treatment plan is the same with floatation and protection via silicone foam dressing.  I have added an order to peel back the silicone foam twice daily to assess left heel. Wound type:Pressure Injury Pressure Injury POA: Yes Measurement:5cm x 3cm   WOC nursing team will not follow, but will remain available to this patient, the nursing and medical teams.  Please re-consult if needed. Thanks, Maudie Flakes, MSN, RN, [place], Arther Abbott  Pager# [phone]

## 2018-11-15 NOTE — Progress Notes (Signed)
PROGRESS NOTE  Stephen Roth.  [id number] DOB: 1925/07/15 DOA: 11/12/2018 PCP: Venia Carbon, MD   Brief Narrative: Patient is a 85 y.o. male with PMHx of dementia, chronic systolic heart failure-s/p AICD, cirrhosis, NPH-resident of a skilled nursing facility-presented to [facility] ED on 10/30 with worsening confusion, found to have COVID-19 viral pneumonia, hyponatremia and A. fib with RVR.  See below for further details.  Per ED note at [facility]-patient was diagnosed with COVID-19 at [facility] skilled nursing facility in [place] approximately 12 days prior to admission.  Assessment & Plan: Principal Problem:   Acute encephalopathy Active Problems:   Hypernatremia   AKI (acute kidney injury) ([place])   Dementia without behavioral disturbance ([place])   COVID-19 virus infection   Atrial fibrillation with RVR (HCC)   Pressure ulcer of left heel  Covid-19 pneumonia:  - Continue remdesivir 10/31 - 11/4 - Holding steroids without significant hypoxia, worried of worsening encephalopathy - Continue airborne, contact precautions. PPE including surgical gown, gloves, cap, shoe covers, and CAPR used during this encounter in a negative pressure room.  - Check daily labs: CBC w/diff, CMP, d-dimer, ferritin, CRP. Inflammatory markers trending downward - Goals of care were discussed. Prognosis is guarded.   Acute metabolic encephalopathy on chronic advanced dementia: Acute metabolic encephalopathy likely multifactorial-secondary to COVID-19 infection, hypernatremia and AKI. CT head without acute findings.  - Delirium precautions, supportive care  Hypernatremia: Secondary to poor oral intake-in the setting of COVID-19 encephalopathy/infection-sodium levels improving with supportive care/gentle hydration - Continue IVF  Hypokalemia: Improved - Continue IVF supplementation  AKI: Likely hemodynamically mediated-creatinine is improving - Continue gentle hydration at 60cc/hr. Confirmed with  RN po intake remains poor  A. fib with RVR:  - Successfully off diltiazem infusion for the time being. Will order diltiazem po now that taking some pills and EF reportedly >50%. - Continue ASA 81mg  as he is significant fall risk and possible hx GI bleeding, so not on full dose anticoagulation.    Chronic combined heart failure: Remains compensated, latest EF showed improvement per family report.  - Watch volume status while on IV fluids.  Hold diuretic given hypernatremia and AKI with poor per oral intake.  Dementia: Appears to be advanced  - Resume namenda once oral intake is slightly better.  NPH: Stable for outpatient follow-up with outpatient MDs  History of liver cirrhosis: Compensated - Monitor LFTs while on remdesivir, modestly elevated.  - Continue outpatient follow-up  Failure to thrive: Very weak/debilitated at baseline-now has encephalopathy from Covid and electrolyte abnormalities.  Per nursing staff-no oral intake at all-await SLP evaluation.  - Continue supportive care. Feeding tube is not within goals of care for this patient per family discussions. - Will start remeron as appetite stimulant given low risk and possible benefit.  Palliative care: DNR in place.  Spoke with patient's daughter-who asked me to speak with patient's granddaughter Dr. Josephine Cables who is a hospitalist with the [facility] system.  Goals of care are for gentle medical treatment-family does not desire aggressive care.  Deep tissue, pressure injury to left heel, POA: Pressure Injury 11/12/18 Heel Left Deep Tissue Injury - Purple or maroon localized area of discolored intact skin or blood-filled blister due to damage of underlying soft tissue from pressure and/or shear. maroon with yellow blister-type area in center;  (Active)  11/12/18   Location: Heel  Location Orientation: Left  Staging: Deep Tissue Injury - Purple or maroon localized area of discolored intact skin or blood-filled blister due to  damage of underlying soft tissue from pressure and/or shear.  Wound Description (Comments): maroon with yellow blister-type area in center; boggy  Present on Admission: Yes    DVT prophylaxis: Lovenox Code Status: DNR Family Communication: Granddaughter by phone Disposition Plan: Uncertain, pending completion of remdesivir  Consultants:   None  Procedures:   None  Antimicrobials:  Remdesivir   Subjective: Confused, tracks to verbal and tactile stimuli.   Objective: Vitals:   11/14/18 1525 11/14/18 2000 11/15/18 0000 11/15/18 0700  BP: 115/82 107/67 105/79 107/74  Pulse: 65 (!) 57  88  Resp: 18 16 18 14   Temp: 97.7 F (36.5 C) 98.3 F (36.8 C)  97.6 F (36.4 C)  TempSrc: Axillary Axillary Axillary Axillary  SpO2: 92% 92% 96% 94%  Weight:      Height:        Intake/Output Summary (Last 24 hours) at 11/15/2018 1511 Last data filed at 11/15/2018 0319 Gross per 24 hour  Intake 1376.08 ml  Output 1050 ml  Net 326.08 ml   Filed Weights   11/13/18 0729  Weight: 86.2 kg    Gen: Frail elderly male in no distress Pulm: Non-labored breathing. Clear to auscultation anteriorly.  CV: Regular rate and rhythm. No murmur, rub, or gallop. No JVD, no pedal edema. GI: Abdomen soft, non-tender, non-distended, with normoactive bowel sounds. No organomegaly or masses felt. Ext: Warm, no deformities Skin: Left boot in place. No rashes, lesions or ulcers Neuro: Alert, not oriented. No focal neurological deficits on limited exam. Psych: UTD  Data Reviewed: I have personally reviewed following labs and imaging studies  CBC: Recent Labs  Lab 11/12/18 1252 11/13/18 0648 11/14/18 0054 11/15/18 0315  WBC 9.9 8.5 12.9* 10.4  NEUTROABS 7.9* 7.4 11.0* 8.2*  HGB 19.6* 18.3* 17.5* 17.3*  HCT 60.2* 55.1* 54.2* 53.5*  MCV 96.3 96.2 97.3 97.8  PLT 257 242 264 0000000   Basic Metabolic Panel: Recent Labs  Lab 11/13/18 0648 11/13/18 1327 11/13/18 1851 11/14/18 0054 11/14/18 0230  11/15/18 0315  NA 150* 151* 150* 151*  --  149*  K 3.5 3.4* 3.2* 3.6  --  3.9  CL 111 113* 111 111  --  111  CO2 26 25 27 26   --  26  GLUCOSE 210* 155* 153* 127*  --  93  BUN 62* 63* 68* 62*  --  51*  CREATININE 1.60* 1.66* 1.59* 1.52*  --  1.36*  CALCIUM 9.1 8.8* 8.9 9.0  --  9.0  MG  --   --   --   --  2.8*  --    GFR: Estimated Creatinine Clearance: 38.8 mL/min (A) (by C-G formula based on SCr of 1.36 mg/dL (H)). Liver Function Tests: Recent Labs  Lab 11/12/18 1252 11/13/18 0648 11/14/18 0054 11/15/18 0315  AST 71* 58* 72* 59*  ALT 37 35 46* 47*  ALKPHOS 84 76 76 74  BILITOT 1.7* 2.0* 1.3* 1.3*  PROT 7.9 6.6 6.5 6.4*  ALBUMIN 3.7 3.2* 3.1* 3.1*   No results for input(s): LIPASE, AMYLASE in the last 168 hours. No results for input(s): AMMONIA in the last 168 hours. Coagulation Profile: No results for input(s): INR, PROTIME in the last 168 hours. Cardiac Enzymes: No results for input(s): CKTOTAL, CKMB, CKMBINDEX, TROPONINI in the last 168 hours. BNP (last 3 results) No results for input(s): PROBNP in the last 8760 hours. HbA1C: No results for input(s): HGBA1C in the last 72 hours. CBG: No results for input(s): GLUCAP in the last 168  hours. Lipid Profile: No results for input(s): CHOL, HDL, LDLCALC, TRIG, CHOLHDL, LDLDIRECT in the last 72 hours. Thyroid Function Tests: No results for input(s): TSH, T4TOTAL, FREET4, T3FREE, THYROIDAB in the last 72 hours. Anemia Panel: Recent Labs    11/15/18 0315  FERRITIN 345*   Urine analysis:    Component Value Date/Time   COLORURINE YELLOW (A) 11/18/2017 1136   APPEARANCEUR CLEAR (A) 11/18/2017 1136   LABSPEC 1.010 11/18/2017 1136   PHURINE 5.0 11/18/2017 1136   GLUCOSEU NEGATIVE 11/18/2017 1136   HGBUR NEGATIVE 11/18/2017 1136   BILIRUBINUR NEGATIVE 11/18/2017 1136   KETONESUR 5 (A) 11/18/2017 1136   PROTEINUR NEGATIVE 11/18/2017 1136   NITRITE POSITIVE (A) 11/18/2017 1136   LEUKOCYTESUR MODERATE (A) 11/18/2017 1136    Recent Results (from the past 240 hour(s))  Blood Culture (routine x 2)     Status: None (Preliminary result)   Collection Time: 11/12/18 12:30 PM   Specimen: BLOOD  Result Value Ref Range Status   Specimen Description BLOOD BLOOD LEFT FOREARM  Final   Special Requests   Final    BOTTLES DRAWN AEROBIC AND ANAEROBIC Blood Culture results may not be optimal due to an inadequate volume of blood received in culture bottles   Culture   Final    NO GROWTH 3 DAYS Performed at [facility], [address], [place], [place] [address]    Report Status PENDING  Incomplete  Blood Culture (routine x 2)     Status: None (Preliminary result)   Collection Time: 11/12/18 12:52 PM   Specimen: BLOOD  Result Value Ref Range Status   Specimen Description BLOOD BLOOD RIGHT FOREARM  Final   Special Requests   Final    BOTTLES DRAWN AEROBIC AND ANAEROBIC Blood Culture adequate volume   Culture   Final    NO GROWTH 3 DAYS Performed at [facility], [address] [place], [place] [address]    Report Status PENDING  Incomplete  MRSA PCR Screening     Status: None   Collection Time: 11/13/18  2:20 AM   Specimen: Nasopharyngeal  Result Value Ref Range Status   MRSA by PCR NEGATIVE NEGATIVE Final    Comment:        The GeneXpert MRSA Assay (FDA approved for NASAL specimens only), is one component of a comprehensive MRSA colonization surveillance program. It is not intended to diagnose MRSA infection nor to guide or monitor treatment for MRSA infections. Performed at [facility], [place] [address] [place],  [address]       Radiology Studies: No results found.  Scheduled Meds: . aspirin  81 mg Oral Daily  . dutasteride  0.5 mg Oral Daily  . enoxaparin (LOVENOX) injection  40 mg Subcutaneous Q24H  . feeding supplement (ENSURE ENLIVE)  237 mL Oral BID BM  . memantine  10 mg Oral BID  . metoprolol tartrate  2.5 mg Intravenous Q8H  . multivitamin  with minerals  1 tablet Oral Daily  . nystatin  1 g Topical BID   Continuous Infusions: . dextrose 5 % with KCl 20 mEq / L 20 mEq (11/15/18 0319)  . remdesivir 100 mg in NS 250 mL 100 mg (11/15/18 0944)     LOS: 3 days   Time spent: 25 minutes.  Patrecia Pour, MD [facility] [email or web address] 11/15/2018, 3:11 PM

## 2018-11-15 NOTE — Progress Notes (Addendum)
Daughter [name] called and updated on patient status and plan of care.  Family requests granddaughter Chrys Racer to be called by physicians each day.

## 2018-11-16 ENCOUNTER — Encounter (HOSPITAL_COMMUNITY): Payer: Self-pay

## 2018-11-16 LAB — CBC WITH DIFFERENTIAL/PLATELET
Abs Immature Granulocytes: 0.05 10*3/uL (ref 0.00–0.07)
Basophils Absolute: 0 10*3/uL (ref 0.0–0.1)
Basophils Relative: 0 %
Eosinophils Absolute: 0 10*3/uL (ref 0.0–0.5)
Eosinophils Relative: 0 %
HCT: 55 % — ABNORMAL HIGH (ref 39.0–52.0)
Hemoglobin: 17.4 g/dL — ABNORMAL HIGH (ref 13.0–17.0)
Immature Granulocytes: 1 %
Lymphocytes Relative: 8 %
Lymphs Abs: 0.7 10*3/uL (ref 0.7–4.0)
MCH: 31.2 pg (ref 26.0–34.0)
MCHC: 31.6 g/dL (ref 30.0–36.0)
MCV: 98.7 fL (ref 80.0–100.0)
Monocytes Absolute: 1.1 10*3/uL — ABNORMAL HIGH (ref 0.1–1.0)
Monocytes Relative: 12 %
Neutro Abs: 7.1 10*3/uL (ref 1.7–7.7)
Neutrophils Relative %: 79 %
Platelets: 241 10*3/uL (ref 150–400)
RBC: 5.57 MIL/uL (ref 4.22–5.81)
RDW: 13 % (ref 11.5–15.5)
WBC: 9.1 10*3/uL (ref 4.0–10.5)
nRBC: 0 % (ref 0.0–0.2)

## 2018-11-16 LAB — COMPREHENSIVE METABOLIC PANEL
ALT: 49 U/L — ABNORMAL HIGH (ref 0–44)
AST: 50 U/L — ABNORMAL HIGH (ref 15–41)
Albumin: 3 g/dL — ABNORMAL LOW (ref 3.5–5.0)
Alkaline Phosphatase: 81 U/L (ref 38–126)
Anion gap: 7 (ref 5–15)
BUN: 36 mg/dL — ABNORMAL HIGH (ref 8–23)
CO2: 30 mmol/L (ref 22–32)
Calcium: 9 mg/dL (ref 8.9–10.3)
Chloride: 118 mmol/L — ABNORMAL HIGH (ref 98–111)
Creatinine, Ser: 1.19 mg/dL (ref 0.61–1.24)
GFR calc Af Amer: >60 mL/min (ref >60)
GFR calc non Af Amer: 53 mL/min — ABNORMAL LOW (ref >60)
Glucose, Bld: 104 mg/dL — ABNORMAL HIGH (ref 70–99)
Potassium: 3.8 mmol/L (ref 3.5–5.1)
Sodium: 155 mmol/L — ABNORMAL HIGH (ref 135–145)
Total Bilirubin: 2.1 mg/dL — ABNORMAL HIGH (ref 0.3–1.2)
Total Protein: 6.2 g/dL — ABNORMAL LOW (ref 6.5–8.1)

## 2018-11-16 LAB — D-DIMER, QUANTITATIVE: D-Dimer, Quant: 1.87 ug/mL-FEU — ABNORMAL HIGH (ref 0.00–0.50)

## 2018-11-16 LAB — C-REACTIVE PROTEIN: CRP: 6.5 mg/dL — ABNORMAL HIGH (ref <1.0)

## 2018-11-16 LAB — FERRITIN: Ferritin: 391 ng/mL — ABNORMAL HIGH (ref 24–336)

## 2018-11-16 MED ORDER — SODIUM CHLORIDE 0.45 % IV BOLUS
250.0000 mL | Freq: Once | INTRAVENOUS | Status: AC
  Administered 2018-11-16: 250 mL via INTRAVENOUS

## 2018-11-16 MED ORDER — POTASSIUM CL IN DEXTROSE 5% 20 MEQ/L IV SOLN
20.0000 meq | INTRAVENOUS | Status: AC
  Administered 2018-11-16 (×2): 20 meq via INTRAVENOUS
  Filled 2018-11-16: qty 1000

## 2018-11-16 MED ORDER — DEXAMETHASONE SODIUM PHOSPHATE 10 MG/ML IJ SOLN
6.0000 mg | Freq: Every day | INTRAMUSCULAR | Status: DC
  Administered 2018-11-16 – 2018-11-22 (×7): 6 mg via INTRAVENOUS
  Filled 2018-11-16 (×7): qty 1

## 2018-11-16 NOTE — Progress Notes (Signed)
Physician notified of Na 155

## 2018-11-16 NOTE — Progress Notes (Signed)
PROGRESS NOTE  Stephen Roth.  [id number] DOB: 11/02/1925 DOA: 11/12/2018 PCP: Venia Carbon, MD   Brief Narrative: Patient is a 85 y.o. male with PMHx of dementia, chronic systolic heart failure-s/p AICD, cirrhosis, NPH-resident of a skilled nursing facility-presented to [facility] ED on 10/30 with worsening confusion, found to have COVID-19 viral pneumonia, hyponatremia and A. fib with RVR.  See below for further details.  Per ED note at [facility]-patient was diagnosed with COVID-19 at [facility] skilled nursing facility in [place] approximately 12 days prior to admission.  Assessment & Plan: Principal Problem:   Acute encephalopathy Active Problems:   Hypernatremia   AKI (acute kidney injury) ([place])   Dementia without behavioral disturbance ([place])   COVID-19 virus infection   Atrial fibrillation with RVR (HCC)   Pressure ulcer of left heel  Covid-19 pneumonia:  - Continue remdesivir 10/31 - 11/4 - We have been holding steroids without significant hypoxia, worried of worsening encephalopathy, though CRP up today and on 2L O2, so will start decadron 6mg  IV daily - Continue airborne, contact precautions. PPE including surgical gown, gloves, cap, shoe covers, and CAPR used during this encounter in a negative pressure room.  - Check daily labs: CBC w/diff, CMP, d-dimer, ferritin, CRP. Inflammatory markers trending downward until 11/3. - Goals of care were discussed. Prognosis is guarded.   Acute metabolic encephalopathy on chronic advanced dementia: Acute metabolic encephalopathy likely multifactorial-secondary to COVID-19 infection, hypernatremia and AKI. CT head without acute findings.  - Delirium precautions, supportive care  Hypernatremia: Secondary to poor oral intake-in the setting of COVID-19 encephalopathy/infection-sodium levels improving with supportive care/gentle hydration - Continue IVF and daily monitoring  Hypokalemia: Improved - Continue IVF supplementation   AKI: Likely hemodynamically mediated-creatinine is improving - Continue gentle hydration (improving, no evidence of overload). Confirmed with RN po intake remains poor  A. fib with RVR:  - Successfully off diltiazem infusion for the time being. Will order diltiazem po now that taking some pills and EF reportedly >50%. Still not consistently taking any pills, so will continue IV metoprolol as well.  - Continue ASA 81mg  as he is significant fall risk and possible hx GI bleeding, so not on full dose anticoagulation.    Chronic combined heart failure: Remains compensated, latest EF showed improvement per family report.  - Watch volume status while on IV fluids.  Hold diuretic given hypernatremia and AKI with poor per oral intake.  Dementia: Appears to be advanced  - Resume namenda once oral intake is slightly better.  NPH: Stable for outpatient follow-up with outpatient MDs  History of liver cirrhosis: Compensated - Monitor LFTs while on remdesivir, modestly elevated.  - Continue outpatient follow-up  Failure to thrive: Very weak/debilitated at baseline-now has encephalopathy from Covid and electrolyte abnormalities.  Per nursing staff-no oral intake at all-await SLP evaluation.  - Continue supportive care. Feeding tube is not within goals of care for this patient per family discussions. - Will start remeron as appetite stimulant given low risk and possible benefit.  Palliative care: DNR in place.  Spoke with patient's daughter-who asked me to speak with patient's granddaughter Dr. Josephine Cables who is a hospitalist with the [facility] system.  Goals of care are for gentle medical treatment-family does not desire aggressive care.  Deep tissue, pressure injury to left heel, POA: Pressure Injury 11/12/18 Heel Left Deep Tissue Injury - Purple or maroon localized area of discolored intact skin or blood-filled blister due to damage of underlying soft tissue from pressure and/or  shear. maroon  with yellow blister-type area in center;  (Active)  11/12/18   Location: Heel  Location Orientation: Left  Staging: Deep Tissue Injury - Purple or maroon localized area of discolored intact skin or blood-filled blister due to damage of underlying soft tissue from pressure and/or shear.  Wound Description (Comments): maroon with yellow blister-type area in center; boggy  Present on Admission: Yes    DVT prophylaxis: Lovenox Code Status: DNR Family Communication: Granddaughter by phone Disposition Plan: Uncertain, pending completion of remdesivir  Consultants:   None  Procedures:   None  Antimicrobials:  Remdesivir   Subjective: Remains confused, unchanged.   Objective: Vitals:   11/16/18 0400 11/16/18 0600 11/16/18 0700 11/16/18 0800  BP: 109/72  94/69 104/74  Pulse: 73  61   Resp: (!) 21 18 18    Temp: 97.8 F (36.6 C)  98.6 F (37 C)   TempSrc: Axillary  Axillary   SpO2: 92%  94%   Weight:      Height:        Intake/Output Summary (Last 24 hours) at 11/16/2018 1221 Last data filed at 11/16/2018 0600 Gross per 24 hour  Intake 300 ml  Output 700 ml  Net -400 ml   Filed Weights   11/13/18 0729  Weight: 86.2 kg   Gen: Elderly, frail male in no distress Pulm: Nonlabored breathing. Remains clear. CV: Regular rate and rhythm. No murmur, rub, or gallop. No JVD, no dependent edema. GI: Abdomen soft, non-tender, non-distended, with normoactive bowel sounds.  Ext: Warm, no deformities Skin: No new rashes, lesions or ulcers on visualized skin. Left heel erythematous/purple without ulceration or odor or palpable fluctuance.  Neuro: Not oriented, not cooperative with exam. Psych: UTD  Data Reviewed: I have personally reviewed following labs and imaging studies  CBC: Recent Labs  Lab 11/12/18 1252 11/13/18 0648 11/14/18 0054 11/15/18 0315 11/16/18 0450  WBC 9.9 8.5 12.9* 10.4 9.1  NEUTROABS 7.9* 7.4 11.0* 8.2* 7.1  HGB 19.6* 18.3* 17.5* 17.3* 17.4*  HCT  60.2* 55.1* 54.2* 53.5* 55.0*  MCV 96.3 96.2 97.3 97.8 98.7  PLT 257 242 264 243 [id number]   Basic Metabolic Panel: Recent Labs  Lab 11/13/18 1327 11/13/18 1851 11/14/18 0054 11/14/18 0230 11/15/18 0315 11/16/18 0450  NA 151* 150* 151*  --  149* 155*  K 3.4* 3.2* 3.6  --  3.9 3.8  CL 113* 111 111  --  111 118*  CO2 25 27 26   --  26 30  GLUCOSE 155* 153* 127*  --  93 104*  BUN 63* 68* 62*  --  51* 36*  CREATININE 1.66* 1.59* 1.52*  --  1.36* 1.19  CALCIUM 8.8* 8.9 9.0  --  9.0 9.0  MG  --   --   --  2.8*  --   --    GFR: Estimated Creatinine Clearance: 44.4 mL/min (by C-G formula based on SCr of 1.19 mg/dL). Liver Function Tests: Recent Labs  Lab 11/12/18 1252 11/13/18 0648 11/14/18 0054 11/15/18 0315 11/16/18 0450  AST 71* 58* 72* 59* 50*  ALT 37 35 46* 47* 49*  ALKPHOS 84 76 76 74 81  BILITOT 1.7* 2.0* 1.3* 1.3* 2.1*  PROT 7.9 6.6 6.5 6.4* 6.2*  ALBUMIN 3.7 3.2* 3.1* 3.1* 3.0*   No results for input(s): LIPASE, AMYLASE in the last 168 hours. No results for input(s): AMMONIA in the last 168 hours. Coagulation Profile: No results for input(s): INR, PROTIME in the last 168 hours. Cardiac Enzymes: No  results for input(s): CKTOTAL, CKMB, CKMBINDEX, TROPONINI in the last 168 hours. BNP (last 3 results) No results for input(s): PROBNP in the last 8760 hours. HbA1C: No results for input(s): HGBA1C in the last 72 hours. CBG: No results for input(s): GLUCAP in the last 168 hours. Lipid Profile: No results for input(s): CHOL, HDL, LDLCALC, TRIG, CHOLHDL, LDLDIRECT in the last 72 hours. Thyroid Function Tests: No results for input(s): TSH, T4TOTAL, FREET4, T3FREE, THYROIDAB in the last 72 hours. Anemia Panel: Recent Labs    11/15/18 0315 11/16/18 0450  FERRITIN 345* 391*   Urine analysis:    Component Value Date/Time   COLORURINE YELLOW (A) 11/18/2017 1136   APPEARANCEUR CLEAR (A) 11/18/2017 1136   LABSPEC 1.010 11/18/2017 1136   PHURINE 5.0 11/18/2017 1136    GLUCOSEU NEGATIVE 11/18/2017 1136   HGBUR NEGATIVE 11/18/2017 1136   BILIRUBINUR NEGATIVE 11/18/2017 1136   KETONESUR 5 (A) 11/18/2017 1136   PROTEINUR NEGATIVE 11/18/2017 1136   NITRITE POSITIVE (A) 11/18/2017 1136   LEUKOCYTESUR MODERATE (A) 11/18/2017 1136   Recent Results (from the past 240 hour(s))  Blood Culture (routine x 2)     Status: None (Preliminary result)   Collection Time: 11/12/18 12:30 PM   Specimen: BLOOD  Result Value Ref Range Status   Specimen Description BLOOD BLOOD LEFT FOREARM  Final   Special Requests   Final    BOTTLES DRAWN AEROBIC AND ANAEROBIC Blood Culture results may not be optimal due to an inadequate volume of blood received in culture bottles   Culture   Final    NO GROWTH 4 DAYS Performed at [facility], [address] [place], [place] [address]    Report Status PENDING  Incomplete  Blood Culture (routine x 2)     Status: None (Preliminary result)   Collection Time: 11/12/18 12:52 PM   Specimen: BLOOD  Result Value Ref Range Status   Specimen Description BLOOD BLOOD RIGHT FOREARM  Final   Special Requests   Final    BOTTLES DRAWN AEROBIC AND ANAEROBIC Blood Culture adequate volume   Culture   Final    NO GROWTH 4 DAYS Performed at [facility], [address], [place], [place] [address]    Report Status PENDING  Incomplete  MRSA PCR Screening     Status: None   Collection Time: 11/13/18  2:20 AM   Specimen: Nasopharyngeal  Result Value Ref Range Status   MRSA by PCR NEGATIVE NEGATIVE Final    Comment:        The GeneXpert MRSA Assay (FDA approved for NASAL specimens only), is one component of a comprehensive MRSA colonization surveillance program. It is not intended to diagnose MRSA infection nor to guide or monitor treatment for MRSA infections. Performed at [facility], [place] [address], [place],  [address]       Radiology Studies: No results found.  Scheduled Meds: . aspirin   81 mg Oral Daily  . diltiazem  120 mg Oral Daily  . dutasteride  0.5 mg Oral Daily  . enoxaparin (LOVENOX) injection  40 mg Subcutaneous Q24H  . feeding supplement (ENSURE ENLIVE)  237 mL Oral BID BM  . memantine  10 mg Oral BID  . metoprolol tartrate  2.5 mg Intravenous Q8H  . mirtazapine  15 mg Oral QHS  . multivitamin with minerals  1 tablet Oral Daily  . nystatin  1 g Topical BID   Continuous Infusions: . dextrose 5 % with KCl 20 mEq /  L 20 mEq (11/16/18 0656)  . remdesivir 100 mg in NS 250 mL 100 mg (11/16/18 1000)     LOS: 4 days   Time spent: 25 minutes.  Patrecia Pour, MD [facility] [email or web address] 11/16/2018, 12:21 PM

## 2018-11-16 NOTE — Progress Notes (Signed)
   Vital Signs MEWS/VS Documentation      11/16/2018 2000   MEWS Score:  3   MEWS Score Color:  Yellow   Resp:  19   Pulse:  96   BP:  95/74   Temp:  98.1 F (36.7 C)   O2 Device:  Nasal Cannula   O2 Flow Rate (L/min):  5 L/min   Level of Consciousness:  Responds to Pain     Contacted covering MD, Timothy Opyd, about MEWS being yellow. MD Opyd put in new orders. Will continue to monitor.       Kashtyn Jankowski L Ohanna Gassert 11/16/2018,9:47 PM

## 2018-11-17 ENCOUNTER — Inpatient Hospital Stay (HOSPITAL_COMMUNITY): Payer: Medicare Other

## 2018-11-17 ENCOUNTER — Encounter (HOSPITAL_COMMUNITY): Payer: Self-pay

## 2018-11-17 LAB — COMPREHENSIVE METABOLIC PANEL
ALT: 40 U/L (ref 0–44)
AST: 36 U/L (ref 15–41)
Albumin: 3 g/dL — ABNORMAL LOW (ref 3.5–5.0)
Alkaline Phosphatase: 87 U/L (ref 38–126)
Anion gap: 10 (ref 5–15)
BUN: 40 mg/dL — ABNORMAL HIGH (ref 8–23)
CO2: 29 mmol/L (ref 22–32)
Calcium: 9 mg/dL (ref 8.9–10.3)
Chloride: 118 mmol/L — ABNORMAL HIGH (ref 98–111)
Creatinine, Ser: 1.55 mg/dL — ABNORMAL HIGH (ref 0.61–1.24)
GFR calc Af Amer: 45 mL/min — ABNORMAL LOW (ref >60)
GFR calc non Af Amer: 39 mL/min — ABNORMAL LOW (ref >60)
Glucose, Bld: 154 mg/dL — ABNORMAL HIGH (ref 70–99)
Potassium: 4.3 mmol/L (ref 3.5–5.1)
Sodium: 157 mmol/L — ABNORMAL HIGH (ref 135–145)
Total Bilirubin: 2.3 mg/dL — ABNORMAL HIGH (ref 0.3–1.2)
Total Protein: 6.5 g/dL (ref 6.5–8.1)

## 2018-11-17 LAB — BASIC METABOLIC PANEL
Anion gap: 8 (ref 5–15)
BUN: 41 mg/dL — ABNORMAL HIGH (ref 8–23)
CO2: 28 mmol/L (ref 22–32)
Calcium: 8.7 mg/dL — ABNORMAL LOW (ref 8.9–10.3)
Chloride: 118 mmol/L — ABNORMAL HIGH (ref 98–111)
Creatinine, Ser: 1.46 mg/dL — ABNORMAL HIGH (ref 0.61–1.24)
GFR calc Af Amer: 48 mL/min — ABNORMAL LOW (ref >60)
GFR calc non Af Amer: 41 mL/min — ABNORMAL LOW (ref >60)
Glucose, Bld: 174 mg/dL — ABNORMAL HIGH (ref 70–99)
Potassium: 3.8 mmol/L (ref 3.5–5.1)
Sodium: 154 mmol/L — ABNORMAL HIGH (ref 135–145)

## 2018-11-17 LAB — CBC WITH DIFFERENTIAL/PLATELET
Abs Immature Granulocytes: 0.05 10*3/uL (ref 0.00–0.07)
Basophils Absolute: 0 10*3/uL (ref 0.0–0.1)
Basophils Relative: 0 %
Eosinophils Absolute: 0 10*3/uL (ref 0.0–0.5)
Eosinophils Relative: 0 %
HCT: 55.4 % — ABNORMAL HIGH (ref 39.0–52.0)
Hemoglobin: 17.8 g/dL — ABNORMAL HIGH (ref 13.0–17.0)
Immature Granulocytes: 1 %
Lymphocytes Relative: 6 %
Lymphs Abs: 0.6 10*3/uL — ABNORMAL LOW (ref 0.7–4.0)
MCH: 32.1 pg (ref 26.0–34.0)
MCHC: 32.1 g/dL (ref 30.0–36.0)
MCV: 100 fL (ref 80.0–100.0)
Monocytes Absolute: 0.7 10*3/uL (ref 0.1–1.0)
Monocytes Relative: 7 %
Neutro Abs: 8.3 10*3/uL — ABNORMAL HIGH (ref 1.7–7.7)
Neutrophils Relative %: 86 %
Platelets: 240 10*3/uL (ref 150–400)
RBC: 5.54 MIL/uL (ref 4.22–5.81)
RDW: 13.2 % (ref 11.5–15.5)
WBC: 9.7 10*3/uL (ref 4.0–10.5)
nRBC: 0 % (ref 0.0–0.2)

## 2018-11-17 LAB — GLUCOSE, CAPILLARY: Glucose-Capillary: 175 mg/dL — ABNORMAL HIGH (ref 70–99)

## 2018-11-17 LAB — CULTURE, BLOOD (ROUTINE X 2)
Culture: NO GROWTH
Culture: NO GROWTH
Special Requests: ADEQUATE

## 2018-11-17 LAB — PHOSPHORUS: Phosphorus: 2.8 mg/dL (ref 2.5–4.6)

## 2018-11-17 LAB — FERRITIN: Ferritin: 461 ng/mL — ABNORMAL HIGH (ref 24–336)

## 2018-11-17 LAB — LACTIC ACID, PLASMA: Lactic Acid, Venous: 2.1 mmol/L (ref 0.5–1.9)

## 2018-11-17 LAB — D-DIMER, QUANTITATIVE: D-Dimer, Quant: 1.5 ug/mL-FEU — ABNORMAL HIGH (ref 0.00–0.50)

## 2018-11-17 LAB — PROCALCITONIN: Procalcitonin: <0.1 ng/mL

## 2018-11-17 LAB — C-REACTIVE PROTEIN: CRP: 10.2 mg/dL — ABNORMAL HIGH (ref <1.0)

## 2018-11-17 LAB — MAGNESIUM: Magnesium: 2.8 mg/dL — ABNORMAL HIGH (ref 1.7–2.4)

## 2018-11-17 MED ORDER — OSMOLITE 1.2 CAL PO LIQD
1000.0000 mL | ORAL | Status: DC
  Filled 2018-11-17: qty 1000

## 2018-11-17 MED ORDER — DEXTROSE 5 % IV SOLN
INTRAVENOUS | Status: DC
  Administered 2018-11-17 – 2018-11-25 (×11): via INTRAVENOUS

## 2018-11-17 MED ORDER — OSMOLITE 1.2 CAL PO LIQD
1000.0000 mL | ORAL | Status: DC
  Administered 2018-11-17 – 2018-11-20 (×4): 1000 mL
  Filled 2018-11-17 (×10): qty 1000

## 2018-11-17 MED ORDER — PRO-STAT SUGAR FREE PO LIQD
30.0000 mL | Freq: Every day | ORAL | Status: DC
  Administered 2018-11-17 – 2018-11-24 (×6): 30 mL
  Filled 2018-11-17 (×5): qty 30

## 2018-11-17 MED ORDER — PRO-STAT SUGAR FREE PO LIQD: 30.0000 mL | Freq: Two times a day (BID) | ORAL | Status: DC

## 2018-11-17 MED ORDER — INSULIN ASPART 100 UNIT/ML ~~LOC~~ SOLN
0.0000 [IU] | Freq: Three times a day (TID) | SUBCUTANEOUS | Status: DC
  Administered 2018-11-18: 3 [IU] via SUBCUTANEOUS
  Administered 2018-11-18 – 2018-11-19 (×2): 2 [IU] via SUBCUTANEOUS
  Administered 2018-11-19: 5 [IU] via SUBCUTANEOUS
  Administered 2018-11-19: 2 [IU] via SUBCUTANEOUS
  Administered 2018-11-20 – 2018-11-22 (×3): 1 [IU] via SUBCUTANEOUS
  Administered 2018-11-22 (×2): 2 [IU] via SUBCUTANEOUS
  Administered 2018-11-23 (×2): 1 [IU] via SUBCUTANEOUS

## 2018-11-17 MED ORDER — FREE WATER
100.0000 mL | Freq: Four times a day (QID) | Status: DC
  Administered 2018-11-17 – 2018-11-18 (×3): 100 mL

## 2018-11-17 NOTE — Progress Notes (Signed)
   Vital Signs MEWS/VS Documentation      11/17/2018 0400 11/17/2018 0700 11/17/2018 0900 11/17/2018 0957   MEWS Score:  2  2  3  1    MEWS Score Color:  Yellow  Yellow  Yellow  Green   Resp:  20  16  -  -   Pulse:  88  72  -  -   BP:  118/74  122/74  -  -   Temp:  98.1 F (36.7 C)  97.8 F (36.6 C)  -  -   O2 Device:  Nasal Cannula  Nasal Cannula  -  -   O2 Flow Rate (L/min):  1 L/min  1 L/min  -  -   Level of Consciousness:  Responds to Pain  Responds to Pain  -  Responds to Voice       Patient had a MEWS Yellow which was green when reassessed.  The score had increased due to a pulse that was momentarily > 100.  Patient remains at baseline from yesterday with pulse in high 80's / low 90's.    Stephen Roth 11/17/2018,11:46 AM

## 2018-11-17 NOTE — Progress Notes (Addendum)
Nutrition Follow-up / Consult  DOCUMENTATION CODES:   Not applicable  INTERVENTION:    Osmolite 1.2 at 20 ml/h, increase by 10 ml every 4 hours to goal rate of 70 ml/h (1680 ml per day)   Pro-stat 30 ml once daily   Provides 2116 kcal, 108 gm protein, 1378 ml free water daily   Monitor magnesium, potassium, and phosphorus levels, MD to replete as needed, as pt is at risk for refeeding syndrome given minimal intake since admission.   Continue to offer Ensure Enlive PO BID, each supplement provides 350 kcal and 20 grams of protein   NUTRITION DIAGNOSIS:   Increased nutrient needs related to acute illness as evidenced by estimated needs.  Ongoing   GOAL:   Patient will meet greater than or equal to 90% of their needs  Progressing  MONITOR:   PO intake, Supplement acceptance, Labs, Weight trends, I & O's, Skin  REASON FOR ASSESSMENT:   Consult Enteral/tube feeding initiation and management  ASSESSMENT:   85 y.o. male with medical history significant of Dementia, NPH, cirrhosis, HTN, CHF. Patient resides in SNF at baseline due to fairly advanced dementia.  SLP following for dysphagia. Continues on a dysphagia 1 diet with honey thick liquids. Patient is taking minimal PO's.   Cortrak tube was placed today, tip is in the stomach. Received MD Consult for TF initiation and management.  Labs reviewed. Sodium 154 (H), BUN 41 (H), creatinine 1.46 (H), magnesium 2.8 (H)  Medications reviewed and include decadron, remeron, MVI, free water 100 ml every 6 hours.   Diet Order:   Diet Order            DIET - DYS 1 Room service appropriate? Yes; Fluid consistency: Honey Thick  Diet effective now              EDUCATION NEEDS:   No education needs have been identified at this time  Skin:   DTI: L heel Other: MASD to scrotum  Last BM:  11/4  Height:   Ht Readings from Last 1 Encounters:  11/13/18 6' (1.829 m)    Weight:   Wt Readings from Last 1  Encounters:  11/13/18 86.2 kg    Ideal Body Weight:  80.9 kg  BMI:  Body mass index is 25.77 kg/m.  Estimated Nutritional Needs:   Kcal:  1950-2150  Protein:  105-120g  Fluid:  2L/day    Molli Barrows, RD, LDN, [place] Pager [phone] After Hours Pager [phone]

## 2018-11-17 NOTE — NC FL2 (Signed)
[place] LEVEL OF CARE SCREENING TOOL     IDENTIFICATION  Patient Name: Stephen Roth. Birthdate: 11-08-1925 Sex: male Admission Date (Current Location): 11/12/2018  [facility] and [place] Number:  Herbalist and Address:  [employer] [facility], [place] [address] [place], [place] [address]      Provider Number: [id number]  Attending Physician Name and Address:  Elgergawy, Silver Huguenin, MD  Relative Name and Phone Number:       Current Level of Care: Hospital Recommended Level of Care: [place] Prior Approval Number:    Date Approved/Denied:   PASRR Number: [id number]  Discharge Plan: SNF    Current Diagnoses: Patient Active Problem List   Diagnosis Date Noted  . Pressure ulcer of left heel 11/13/2018  . Acute encephalopathy 11/12/2018  . Hypernatremia 11/12/2018  . AKI (acute kidney injury) ([place]) 11/12/2018  . Dementia without behavioral disturbance ([place]) 11/12/2018  . COVID-19 virus infection 11/12/2018  . Atrial fibrillation with RVR ([place]) 11/12/2018  . Syncope 11/18/2017  . Femur fracture, left ([place]) 08/30/2016  . Hip fracture ([place]) 01/20/2016  . GI bleed 08/10/2015    Orientation RESPIRATION BLADDER Height & Weight     (non verbal)  O2(1L) External catheter Weight: 189 lb 15.9 oz (86.2 kg) Height:  6' (182.9 cm)  BEHAVIORAL SYMPTOMS/MOOD NEUROLOGICAL BOWEL NUTRITION STATUS        Diet(See dc summary)  AMBULATORY STATUS COMMUNICATION OF NEEDS Skin   Extensive Assist   Normal                       Personal Care Assistance Level of Assistance  Bathing, Feeding, Dressing Bathing Assistance: Maximum assistance Feeding assistance: Maximum assistance Dressing Assistance: Maximum assistance     Functional Limitations Info             SPECIAL CARE FACTORS FREQUENCY  PT (By licensed PT), OT (By licensed OT)     PT Frequency: 5 OT Frequency: 5            Contractures      Additional  Factors Info  Code Status, Allergies Code Status Info: DNR Allergies Info: Cephalexin Clindamycin/lincomycin Morphine And Related           Current Medications (11/17/2018):  This is the current hospital active medication list Current Facility-Administered Medications  Medication Dose Route Frequency Provider Last Rate Last Dose  . acetaminophen (TYLENOL) tablet 650 mg  650 mg Oral Q6H PRN Ghimire, Shanker M, MD      . albuterol (VENTOLIN HFA) 108 (90 Base) MCG/ACT inhaler 2 puff  2 puff Inhalation Q4H PRN Ghimire, Henreitta Leber, MD      . aspirin chewable tablet 81 mg  81 mg Oral Daily Jonetta Osgood, MD   81 mg at 11/15/18 0952  . dexamethasone (DECADRON) injection 6 mg  6 mg Intravenous Daily Patrecia Pour, MD   6 mg at 11/17/18 0817  . dextrose 5 % solution   Intravenous Continuous Elgergawy, Silver Huguenin, MD 75 mL/hr at 11/17/18 [phone]    . diltiazem (CARDIZEM CD) 24 hr capsule 120 mg  120 mg Oral Daily Patrecia Pour, MD   120 mg at 11/15/18 1614  . dutasteride (AVODART) capsule 0.5 mg  0.5 mg Oral Daily Jonetta Osgood, MD   0.5 mg at 11/15/18 0939  . enoxaparin (LOVENOX) injection 40 mg  40 mg Subcutaneous Q24H Ghimire, Henreitta Leber, MD   40  mg at 11/16/18 1351  . feeding supplement (ENSURE ENLIVE) (ENSURE ENLIVE) liquid 237 mL  237 mL Oral BID BM Ghimire, Henreitta Leber, MD      . memantine [facility]) tablet 10 mg  10 mg Oral BID Jonetta Osgood, MD   10 mg at 11/15/18 2233  . metoprolol tartrate (LOPRESSOR) injection 2.5 mg  2.5 mg Intravenous Q8H Ghimire, Henreitta Leber, MD   2.5 mg at 11/17/18 0518  . mirtazapine (REMERON) tablet 15 mg  15 mg Oral QHS Patrecia Pour, MD   15 mg at 11/15/18 2233  . multivitamin with minerals tablet 1 tablet  1 tablet Oral Daily Jonetta Osgood, MD   1 tablet at 11/15/18 [phone]  . nystatin (MYCOSTATIN/NYSTOP) topical powder 1 g  1 g Topical BID Jonetta Osgood, MD   1 g at 11/17/18 0817  . ondansetron (ZOFRAN) tablet 4 mg  4 mg Oral Q6H PRN Etta Quill, DO        Or  . ondansetron [facility]) injection 4 mg  4 mg Intravenous Q6H PRN Etta Quill, DO      . remdesivir 100 mg in sodium chloride 0.9 % 250 mL IVPB  100 mg Intravenous Q24H Jonetta Osgood, MD   Stopped at 11/16/18 1900  . Resource ThickenUp Clear   Oral PRN Ghimire, Henreitta Leber, MD         Discharge Medications: Please see discharge summary for a list of discharge medications.  Relevant Imaging Results:  Relevant Lab Results:   Additional Information SS# [id number]  Weston Anna, LCSW

## 2018-11-17 NOTE — Progress Notes (Signed)
Brief Nutrition Note  Consult received for enteral/tube feeding initiation and management.  Adult Enteral Nutrition Protocol initiated. Full assessment to follow.  Admitting Dx: COVID19 POSITIVE  Body mass index is 25.77 kg/m.  Labs:  Recent Labs  Lab 11/14/18 0230  11/16/18 0450 11/17/18 0605 11/17/18 1550  NA  --    < > 155* 157* 154*  K  --    < > 3.8 4.3 3.8  CL  --    < > 118* 118* 118*  CO2  --    < > 30 29 28   BUN  --    < > 36* 40* 41*  CREATININE  --    < > 1.19 1.55* 1.46*  CALCIUM  --    < > 9.0 9.0 8.7*  MG 2.8*  --   --   --   --   GLUCOSE  --    < > 104* 154* 174*   < > = values in this interval not displayed.    [place], [place], [place] Pager [phone] After Hours Pager

## 2018-11-17 NOTE — Progress Notes (Signed)
  Speech Language Pathology Treatment: Dysphagia  Patient Details Name: Stephen Roth. MRN: [id number] DOB: [birth year] Today's Date: 11/17/2018 Time: [id number] SLP Time Calculation (min) (ACUTE ONLY): 10 min  Assessment / Plan / Recommendation Clinical Impression  Pt has limited PO intake despite Max cues from SLP. Although performance may fluctuate throughout the day, it appears as though his intake overall has been very limited. Initial spoonful of honey thick liquids required Max cues to initiate swallow, but his awareness began to improve with subsequent boluses. He still appears to have a delay in swallow initiation, given sometimes prolonged period of time from bolus acceptance to hyolaryngeal movement, while pt's oral cavity is clear both visually and with attempt to suction any of the bolus out. If boluses are pooling in his pharynx before the swallow, this along with his current mentation put him at a heightened risk for aspiration, making full assist during PO intake very important. Pt exhibited increased oral holding and decreased acceptance with purees compared to honey thick liquids. He does not appear to be ready for anything more advanced. If intake remains this limited, MD may wish to discuss overall GOC with family further as I do not think a feeding tube would be indicated in the setting of his more progressed dementia. Will continue to follow acutely.   HPI HPI: Patient is a 85 y.o. male with PMHx of dementia, chronic systolic heart failure-s/p AICD, cirrhosis, NPH-resident of a skilled nursing facility-presented to [facility] ED on 10/30 with worsening confusion, found to have COVID-19 viral pneumonia, hyponatremia and A. fib with RVR.       SLP Plan  Continue with current plan of care       Recommendations  Diet recommendations: Dysphagia 1 (puree);Honey-thick liquid Liquids provided via: Teaspoon;Straw Medication Administration: Crushed with puree Supervision: Staff to  assist with self feeding;Full supervision/cueing for compensatory strategies Compensations: Minimize environmental distractions;Slow rate;Small sips/bites Postural Changes and/or Swallow Maneuvers: Seated upright 90 degrees;Upright 30-60 min after meal                Oral Care Recommendations: Oral care QID Follow up Recommendations: Skilled Nursing facility SLP Visit Diagnosis: Dysphagia, oropharyngeal phase (R13.12) Plan: Continue with current plan of care       GO                Venita Sheffield Gal Smolinski 11/17/2018, 10:46 AM  Pollyann Glen, M.A. [place] Acute Environmental education officer [phone] Office [phone]

## 2018-11-17 NOTE — Progress Notes (Signed)
Per order contated MD R. David regarding Blood Sugar 175.

## 2018-11-17 NOTE — Progress Notes (Signed)
Contacted covering MD, R. Shanon Brow, to clarify new orders. Patient is on continuous tube feed and new orders were put in for ACHS finger-sticks instead of Q4H. Will continue to monitor.

## 2018-11-17 NOTE — Procedures (Signed)
Cortrak  Person Inserting Tube:  Dorance Spink C, RD Tube Type:  Cortrak - 43 inches Tube Location:  Left nare Initial Placement:  Stomach Secured by: Bridle Technique Used to Measure Tube Placement:  Documented cm marking at nare/ corner of mouth Cortrak Secured At:  70 cm    Cortrak Tube Team Note:  Consult received to place a Cortrak feeding tube.   No x-ray is required. RN may begin using tube.   If the tube becomes dislodged please keep the tube and contact the Cortrak team at [email or web address] (password [email or web address]) for replacement.  If after hours and replacement cannot be delayed, place a NG tube and confirm placement with an abdominal x-ray.    Ohm Dentler RD, LDN, CNSC [phone] Pager [phone] After Hours Pager   

## 2018-11-17 NOTE — TOC Transition Note (Addendum)
Transition of Care [facility]) - CM/SW Discharge Note   Patient Details  Name: Stephen Roth. MRN: [id number] Date of Birth: 05-02-1925  Transition of Care [facility]) CM/SW Contact:  Weston Anna, LCSW Phone Number: 11/17/2018, 9:53 AM   Clinical Narrative:    Update: CSW spoke with patients daughter, [name], who voiced concerns/ uncertainty if patient would return back to [facility]. Daughter stated she would prefer patient to return back to [facility] if he recovers however patient is currently not eating/drinking- if patient continues to decline she would prefer patient to go towards residential hospice in order for family to be with him.    Patient is a long- term resident at [facility]. CSW contacted facility and they are able to accept patient back once medically stable. Will continue to follow patient and update family.     Barriers to Discharge: Continued Medical Work up   Patient Goals and CMS Choice        Discharge Placement                       Discharge Plan and Services In-house Referral: Clinical Social Work              DME Arranged: N/A         HH Arranged: NA          Social Determinants of Health (SDOH) Interventions     Readmission Risk Interventions No flowsheet data found.

## 2018-11-17 NOTE — Progress Notes (Signed)
PROGRESS NOTE  Stephen Roth.  [id number] DOB: 1925/11/21 DOA: 11/12/2018 PCP: Venia Carbon, MD   Brief Narrative: Patient is a 85 y.o. male with PMHx of dementia, chronic systolic heart failure-s/p AICD, cirrhosis, NPH-resident of a skilled nursing facility-presented to [facility] ED on 10/30 with worsening confusion, found to have COVID-19 viral pneumonia, hyponatremia and A. fib with RVR.  See below for further details.  Per ED note at [facility]-patient was diagnosed with COVID-19 at [facility] skilled nursing facility in [place] approximately 12 days prior to admission.  Subjective: Patient appears to be more obtunded and less reactive today, no significant events as discussed with staff.   Assessment & Plan: Principal Problem:   Acute encephalopathy Active Problems:   Hypernatremia   AKI (acute kidney injury) ([place])   Dementia without behavioral disturbance ([place])   COVID-19 virus infection   Atrial fibrillation with RVR ([facility])   Pressure ulcer of left heel  Covid-19 pneumonia:  -Patient is currently on 1 to 2 L nasal cannula, with opacity on admission, extremely frail. - Continue remdesivir 10/31 - 11/4 -started on IV Decadron given he started to develop hypoxia -Patient is extremely frail, 1 be able to tolerate incentive spirometry -Inflammatory markers trending up, CRP significantly elevated at 10.2 today, but D-dimers is trending down.  COVID-19 Labs  Recent Labs    11/15/18 0315 11/16/18 0450 11/17/18 0605  DDIMER 2.33* 1.87* 1.50*  FERRITIN 345* 391* 461*  CRP 2.2* 6.5* 10.2*    No results found for: 0000000    Acute metabolic encephalopathy on chronic advanced dementia:  - Acute metabolic encephalopathy likely multifactorial-secondary to COVID-19 infection, hypernatremia and AKI. CT head without acute findings.  - Delirium precautions, supportive care -Patient appears to be more obtunded today, this is most likely in the setting of worsening  hypernatremia, possible infectious process giving elevated CRP, will check UA, checks x-ray, procalcitonin lactic acid.  Hypernatremia:  -Continue to very poor oral intake in the setting of COVID-19 encephalopathy/infection, sodium elevated at 157 today, will start on D5W at 75 cc/h .  Hypokalemia: Improved - Continue IVF supplementation  AKI:  -Creatinine with slight increase today secondary to dehydration, back on IV fluid, recheck in a.m. .  A. fib with RVR:  - Successfully off diltiazem infusion for the time being. Will order diltiazem po now that taking some pills and EF reportedly >50%. Still not consistently taking any pills, so will continue IV metoprolol as well.  - Continue ASA 81mg  as he is significant fall risk and possible hx GI bleeding, so not on full dose anticoagulation.    Chronic combined heart failure: Remains compensated, latest EF showed improvement per family report.  - Watch volume status while on IV fluids.  Hold diuretic given hypernatremia and AKI with poor per oral intake.  Dementia: Appears to be advanced  - Resume namenda once oral intake is slightly better.  NPH: Stable for outpatient follow-up with outpatient MDs  History of liver cirrhosis: Compensated - Monitor LFTs while on remdesivir, modestly elevated.  - Continue outpatient follow-up  Failure to thrive: Very weak/debilitated at baseline-now has encephalopathy from Covid and electrolyte abnormalities.  Per nursing staff-no oral intake at all-await SLP evaluation.  - Continue supportive care.  Discussed with granddaughter today, he would like to try short-term of tube feed to see if this helps with overall conditioning, will place core track tube and start on tube feed.  Palliative care: DNR in place.  Spoke with granddaughter Dr. Chrys Racer  Mancel Bale who is a hospitalist with the [facility] system.  Goals of care are for gentle medical treatment-family does not desire aggressive care.  Deep tissue,  pressure injury to left heel, POA: Pressure Injury 11/12/18 Heel Left Deep Tissue Injury - Purple or maroon localized area of discolored intact skin or blood-filled blister due to damage of underlying soft tissue from pressure and/or shear. maroon with yellow blister-type area in center;  (Active)  11/12/18   Location: Heel  Location Orientation: Left  Staging: Deep Tissue Injury - Purple or maroon localized area of discolored intact skin or blood-filled blister due to damage of underlying soft tissue from pressure and/or shear.  Wound Description (Comments): maroon with yellow blister-type area in center; boggy  Present on Admission: Yes    DVT prophylaxis: Lovenox Code Status: DNR Family Communication: Granddaughter by phone Disposition Plan: Uncertain, pending completion of remdesivir  Consultants:   None  Procedures:   None  Antimicrobials:  Remdesivir    Objective: Vitals:   11/17/18 0200 11/17/18 0400 11/17/18 0700 11/17/18 1200  BP: 98/69 118/74 122/74 129/86  Pulse:  88 72   Resp:  20 16 18   Temp:  98.1 F (36.7 C) 97.8 F (36.6 C) 98.2 F (36.8 C)  TempSrc:  Axillary Axillary Axillary  SpO2:  93% 98% 96%  Weight:      Height:        Intake/Output Summary (Last 24 hours) at 11/17/2018 1503 Last data filed at 11/17/2018 0500 Gross per 24 hour  Intake 180 ml  Output 450 ml  Net -270 ml   Filed Weights   11/13/18 0729  Weight: 86.2 kg    Extremely frail, chronically ill-appearing, obtunded, does not follow commands or answer questions appropriately Symmetrical Chest wall movement, Good air movement bilaterally, no wheezing or rails RRR,No Gallops,Rubs or new Murmurs, No Parasternal Heave +ve B.Sounds, Abd Soft, No tenderness, No rebound - guarding or rigidity. No Cyanosis, Clubbing or edema, No new Rash or bruise    Data Reviewed: I have personally reviewed following labs and imaging studies  CBC: Recent Labs  Lab 11/13/18 0648 11/14/18 0054  11/15/18 0315 11/16/18 0450 11/17/18 0605  WBC 8.5 12.9* 10.4 9.1 9.7  NEUTROABS 7.4 11.0* 8.2* 7.1 8.3*  HGB 18.3* 17.5* 17.3* 17.4* 17.8*  HCT 55.1* 54.2* 53.5* 55.0* 55.4*  MCV 96.2 97.3 97.8 98.7 100.0  PLT 242 264 243 241 [id number]   Basic Metabolic Panel: Recent Labs  Lab 11/13/18 1851 11/14/18 0054 11/14/18 0230 11/15/18 0315 11/16/18 0450 11/17/18 0605  NA 150* 151*  --  149* 155* 157*  K 3.2* 3.6  --  3.9 3.8 4.3  CL 111 111  --  111 118* 118*  CO2 27 26  --  26 30 29   GLUCOSE 153* 127*  --  93 104* 154*  BUN 68* 62*  --  51* 36* 40*  CREATININE 1.59* 1.52*  --  1.36* 1.19 1.55*  CALCIUM 8.9 9.0  --  9.0 9.0 9.0  MG  --   --  2.8*  --   --   --    GFR: Estimated Creatinine Clearance: 34.1 mL/min (A) (by C-G formula based on SCr of 1.55 mg/dL (H)). Liver Function Tests: Recent Labs  Lab 11/13/18 0648 11/14/18 0054 11/15/18 0315 11/16/18 0450 11/17/18 0605  AST 58* 72* 59* 50* 36  ALT 35 46* 47* 49* 40  ALKPHOS 76 76 74 81 87  BILITOT 2.0* 1.3* 1.3* 2.1* 2.3*  PROT 6.6 6.5  6.4* 6.2* 6.5  ALBUMIN 3.2* 3.1* 3.1* 3.0* 3.0*   No results for input(s): LIPASE, AMYLASE in the last 168 hours. No results for input(s): AMMONIA in the last 168 hours. Coagulation Profile: No results for input(s): INR, PROTIME in the last 168 hours. Cardiac Enzymes: No results for input(s): CKTOTAL, CKMB, CKMBINDEX, TROPONINI in the last 168 hours. BNP (last 3 results) No results for input(s): PROBNP in the last 8760 hours. HbA1C: No results for input(s): HGBA1C in the last 72 hours. CBG: No results for input(s): GLUCAP in the last 168 hours. Lipid Profile: No results for input(s): CHOL, HDL, LDLCALC, TRIG, CHOLHDL, LDLDIRECT in the last 72 hours. Thyroid Function Tests: No results for input(s): TSH, T4TOTAL, FREET4, T3FREE, THYROIDAB in the last 72 hours. Anemia Panel: Recent Labs    11/16/18 0450 11/17/18 0605  FERRITIN 391* 461*   Urine analysis:    Component Value  Date/Time   COLORURINE YELLOW (A) 11/18/2017 1136   APPEARANCEUR CLEAR (A) 11/18/2017 1136   LABSPEC 1.010 11/18/2017 1136   PHURINE 5.0 11/18/2017 1136   GLUCOSEU NEGATIVE 11/18/2017 1136   HGBUR NEGATIVE 11/18/2017 1136   BILIRUBINUR NEGATIVE 11/18/2017 1136   KETONESUR 5 (A) 11/18/2017 1136   PROTEINUR NEGATIVE 11/18/2017 1136   NITRITE POSITIVE (A) 11/18/2017 1136   LEUKOCYTESUR MODERATE (A) 11/18/2017 1136   Recent Results (from the past 240 hour(s))  Blood Culture (routine x 2)     Status: None   Collection Time: 11/12/18 12:30 PM   Specimen: BLOOD  Result Value Ref Range Status   Specimen Description BLOOD BLOOD LEFT FOREARM  Final   Special Requests   Final    BOTTLES DRAWN AEROBIC AND ANAEROBIC Blood Culture results may not be optimal due to an inadequate volume of blood received in culture bottles   Culture   Final    NO GROWTH 5 DAYS Performed at [facility], [address] [place], [place] [address]    Report Status 11/17/2018 FINAL  Final  Blood Culture (routine x 2)     Status: None   Collection Time: 11/12/18 12:52 PM   Specimen: BLOOD  Result Value Ref Range Status   Specimen Description BLOOD BLOOD RIGHT FOREARM  Final   Special Requests   Final    BOTTLES DRAWN AEROBIC AND ANAEROBIC Blood Culture adequate volume   Culture   Final    NO GROWTH 5 DAYS Performed at [facility], [address] [place], [place] [address]    Report Status 11/17/2018 FINAL  Final  MRSA PCR Screening     Status: None   Collection Time: 11/13/18  2:20 AM   Specimen: Nasopharyngeal  Result Value Ref Range Status   MRSA by PCR NEGATIVE NEGATIVE Final    Comment:        The GeneXpert MRSA Assay (FDA approved for NASAL specimens only), is one component of a comprehensive MRSA colonization surveillance program. It is not intended to diagnose MRSA infection nor to guide or monitor treatment for MRSA infections. Performed at [facility], [place] [address] [place], [place] [address]       Radiology Studies: No results found.  Scheduled Meds: . aspirin  81 mg Oral Daily  . dexamethasone (DECADRON) injection  6 mg Intravenous Daily  . diltiazem  120 mg Oral Daily  . dutasteride  0.5 mg Oral Daily  . enoxaparin (LOVENOX) injection  40 mg Subcutaneous Q24H  . feeding supplement (ENSURE ENLIVE)  237 mL Oral  BID BM  . memantine  10 mg Oral BID  . metoprolol tartrate  2.5 mg Intravenous Q8H  . mirtazapine  15 mg Oral QHS  . multivitamin with minerals  1 tablet Oral Daily  . nystatin  1 g Topical BID   Continuous Infusions: . dextrose 75 mL/hr at 11/17/18 0823     LOS: 5 days    Phillips Climes, MD [facility] [email or web address] 11/17/2018, 3:03 PM

## 2018-11-18 ENCOUNTER — Encounter (HOSPITAL_COMMUNITY): Payer: Self-pay

## 2018-11-18 LAB — COMPREHENSIVE METABOLIC PANEL
ALT: 29 U/L (ref 0–44)
AST: 24 U/L (ref 15–41)
Albumin: 2.6 g/dL — ABNORMAL LOW (ref 3.5–5.0)
Alkaline Phosphatase: 85 U/L (ref 38–126)
Anion gap: 8 (ref 5–15)
BUN: 46 mg/dL — ABNORMAL HIGH (ref 8–23)
CO2: 26 mmol/L (ref 22–32)
Calcium: 8.7 mg/dL — ABNORMAL LOW (ref 8.9–10.3)
Chloride: 119 mmol/L — ABNORMAL HIGH (ref 98–111)
Creatinine, Ser: 1.32 mg/dL — ABNORMAL HIGH (ref 0.61–1.24)
GFR calc Af Amer: 54 mL/min — ABNORMAL LOW (ref >60)
GFR calc non Af Amer: 47 mL/min — ABNORMAL LOW (ref >60)
Glucose, Bld: 224 mg/dL — ABNORMAL HIGH (ref 70–99)
Potassium: 4.1 mmol/L (ref 3.5–5.1)
Sodium: 153 mmol/L — ABNORMAL HIGH (ref 135–145)
Total Bilirubin: 1.6 mg/dL — ABNORMAL HIGH (ref 0.3–1.2)
Total Protein: 5.5 g/dL — ABNORMAL LOW (ref 6.5–8.1)

## 2018-11-18 LAB — URINALYSIS, ROUTINE W REFLEX MICROSCOPIC
Bilirubin Urine: NEGATIVE
Glucose, UA: NEGATIVE mg/dL
Hgb urine dipstick: NEGATIVE
Ketones, ur: NEGATIVE mg/dL
Nitrite: NEGATIVE
Protein, ur: 100 mg/dL — AB
Specific Gravity, Urine: 1.014 (ref 1.005–1.030)
pH: 7 (ref 5.0–8.0)

## 2018-11-18 LAB — GLUCOSE, CAPILLARY
Glucose-Capillary: 101 mg/dL — ABNORMAL HIGH (ref 70–99)
Glucose-Capillary: 178 mg/dL — ABNORMAL HIGH (ref 70–99)
Glucose-Capillary: 192 mg/dL — ABNORMAL HIGH (ref 70–99)
Glucose-Capillary: 196 mg/dL — ABNORMAL HIGH (ref 70–99)
Glucose-Capillary: 209 mg/dL — ABNORMAL HIGH (ref 70–99)
Glucose-Capillary: 238 mg/dL — ABNORMAL HIGH (ref 70–99)

## 2018-11-18 LAB — CBC
HCT: 49.9 % (ref 39.0–52.0)
Hemoglobin: 16 g/dL (ref 13.0–17.0)
MCH: 31.7 pg (ref 26.0–34.0)
MCHC: 32.1 g/dL (ref 30.0–36.0)
MCV: 99 fL (ref 80.0–100.0)
Platelets: 256 10*3/uL (ref 150–400)
RBC: 5.04 MIL/uL (ref 4.22–5.81)
RDW: 13.2 % (ref 11.5–15.5)
WBC: 14.8 10*3/uL — ABNORMAL HIGH (ref 4.0–10.5)
nRBC: 0 % (ref 0.0–0.2)

## 2018-11-18 LAB — MAGNESIUM: Magnesium: 2.9 mg/dL — ABNORMAL HIGH (ref 1.7–2.4)

## 2018-11-18 LAB — HEMOGLOBIN A1C
Hgb A1c MFr Bld: 6.1 % — ABNORMAL HIGH (ref 4.8–5.6)
Mean Plasma Glucose: 128.37 mg/dL

## 2018-11-18 LAB — PHOSPHORUS: Phosphorus: 2.4 mg/dL — ABNORMAL LOW (ref 2.5–4.6)

## 2018-11-18 LAB — C-REACTIVE PROTEIN: CRP: 5.6 mg/dL — ABNORMAL HIGH (ref <1.0)

## 2018-11-18 LAB — FERRITIN: Ferritin: 394 ng/mL — ABNORMAL HIGH (ref 24–336)

## 2018-11-18 LAB — PROCALCITONIN: Procalcitonin: <0.1 ng/mL

## 2018-11-18 MED ORDER — K PHOS MONO-SOD PHOS DI & MONO 155-852-130 MG PO TABS
250.0000 mg | ORAL_TABLET | Freq: Three times a day (TID) | ORAL | Status: DC
  Administered 2018-11-18 – 2018-11-19 (×5): 250 mg
  Filled 2018-11-18 (×5): qty 1

## 2018-11-18 MED ORDER — FREE WATER
300.0000 mL | Status: DC
  Administered 2018-11-18 – 2018-11-19 (×6): 300 mL

## 2018-11-18 MED ORDER — LEVOFLOXACIN IN D5W 500 MG/100ML IV SOLN
500.0000 mg | Freq: Once | INTRAVENOUS | Status: AC
  Administered 2018-11-18: 500 mg via INTRAVENOUS
  Filled 2018-11-18: qty 100

## 2018-11-18 MED ORDER — LEVOFLOXACIN IN D5W 250 MG/50ML IV SOLN: 250.0000 mg | INTRAVENOUS | Status: DC

## 2018-11-18 MED ORDER — LEVOFLOXACIN IN D5W 500 MG/100ML IV SOLN: 500.0000 mg | INTRAVENOUS | Status: DC

## 2018-11-18 MED ORDER — FREE WATER: 200.0000 mL | Status: DC

## 2018-11-18 NOTE — Progress Notes (Signed)
Physical Therapy Treatment Patient Details Name: Stephen Roth. MRN: [id number] DOB: 1925/05/09 Today's Date: 11/18/2018    History of Present Illness Pt is a 85 y.o. male SNF resident admitted to [facility] ED on 11/12/18 with worsening confusion, found to have COVID-19 viral pneumonia, hyponatremia and A. fib with RVR. S/p cortrak 11/4. PMH includes dementia, chronic systolic heart failure s/p AICD, cirrhosis, NPH.   PT Comments    Pt not progressing with mobility, limited by increased lethargy. Pt only opening eyes a couple times and speaking  3-4 words during session, requiring totalA for bed mobility, repositioning, and pericare. Too lethargic to safely attempt OOB mobility with maximove lift. Will continue to follow acutely and progress as able.  SpO2 96% on RA, respiration rate 22 (although wall monitor reading 35-38) HR 87 Resting BP 109/74 Post session BP 98/72    Follow Up Recommendations  SNF;Supervision/Assistance - 24 hour     Equipment Recommendations  (defer)    Recommendations for Other Services       Precautions / Restrictions Precautions Precautions: Fall;Other (comment) Precaution Comments: cortrak Restrictions Weight Bearing Restrictions: No    Mobility  Bed Mobility Overal bed mobility: Needs Assistance Bed Mobility: Rolling Rolling: Total assist         General bed mobility comments: TotalA to roll to R-side for posterior pericare, pt initially reaching out with L hand but then not engaging with transfer; totalA for repositioning; pt left with pillow to offload L-side  Transfers                 General transfer comment: Too lethargic to safely use maximove  Ambulation/Gait                 Stairs             Wheelchair Mobility    Modified Rankin (Stroke Patients Only)       Balance                                            Cognition Arousal/Alertness: Lethargic Behavior During Therapy: Flat  affect Overall Cognitive Status: Impaired/Different from baseline                                 General Comments: not following commands; eyes closed majority of session; some grimacing to pain, only 3-4 words      Exercises      General Comments        Pertinent Vitals/Pain Pain Assessment: Faces Faces Pain Scale: Hurts a little bit Pain Location: Minimal grimacing with repositioning, unable to localize Pain Descriptors / Indicators: Grimacing Pain Intervention(s): Monitored during session;Repositioned    Home Living                      Prior Function            PT Goals (current goals can now be found in the care plan section) Progress towards PT goals: Not progressing toward goals - comment(limited by lethargy)    Frequency    Min 2X/week      PT Plan Current plan remains appropriate    Co-evaluation              AM-PAC PT "6 Clicks" Mobility   Outcome Measure  Help needed  turning from your back to your side while in a flat bed without using bedrails?: Total Help needed moving from lying on your back to sitting on the side of a flat bed without using bedrails?: Total Help needed moving to and from a bed to a chair (including a wheelchair)?: Total Help needed standing up from a chair using your arms (e.g., wheelchair or bedside chair)?: Total Help needed to walk in hospital room?: Total Help needed climbing 3-5 steps with a railing? : Total 6 Click Score: 6    End of Session   Activity Tolerance: Patient limited by lethargy Patient left: in bed;with call bell/phone within reach;with bed alarm set Nurse Communication: Mobility status;Need for lift equipment PT Visit Diagnosis: Other abnormalities of gait and mobility (R26.89);Muscle weakness (generalized) (M62.81)     Time: [id number] PT Time Calculation (min) (ACUTE ONLY): 31 min  Charges:  $Therapeutic Activity: 23-37 mins                     Mabeline Caras, PT,  DPT Acute Rehabilitation Services  Pager [phone] Office [place] 11/18/2018, 4:54 PM

## 2018-11-18 NOTE — Progress Notes (Signed)
Patient bath done

## 2018-11-18 NOTE — Progress Notes (Addendum)
PROGRESS NOTE  Stephen Roth.  [id number] DOB: 10/31/1925 DOA: 11/12/2018 PCP: Venia Carbon, MD   Brief Narrative: Patient is a 85 y.o. male with PMHx of dementia, chronic systolic heart failure-s/p AICD, cirrhosis, NPH-resident of a skilled nursing facility-presented to [facility] ED on 10/30 with worsening confusion, found to have COVID-19 viral pneumonia, hyponatremia and A. fib with RVR.  See below for further details.  Per ED note at [facility]-patient was diagnosed with COVID-19 at [facility] skilled nursing facility in [place] approximately 12 days prior to admission.  Subjective: Discussed with staff, no significant events overnight, he had core track done yesterday, and started on tube feed.   Assessment & Plan: Principal Problem:   Acute encephalopathy Active Problems:   Hypernatremia   AKI (acute kidney injury) ([place])   Dementia without behavioral disturbance ([place])   COVID-19 virus infection   Atrial fibrillation with RVR ([facility])   Pressure ulcer of left heel  Covid-19 pneumonia:  -Is requiring oxygen intermittently , remains on 1 L nasal cannula as needed . - Received  remdesivir 10/31 - 11/4 -Continue with IV Decadron, started after he started to develop hypoxia -Patient is extremely frail, with altered mental status, unable to comply with incentive spirometry. -foIlow inflammatory markers closely.Marland Kitchen  COVID-19 Labs  Recent Labs    11/16/18 0450 11/17/18 0605 11/18/18 0535  DDIMER 1.87* 1.50*  --   FERRITIN 391* 461* 394*  CRP 6.5* 10.2*  --     No results found for: 0000000    Acute metabolic encephalopathy on chronic advanced dementia:  - Acute metabolic encephalopathy likely multifactorial-secondary to COVID-19 infection, hypernatremia and AKI. CT head without acute findings.  - Delirium precautions, supportive care. -Patient remains significantly altered and obtunded, does not follow commands or answer any questions.  UTI -Patient with  positive UA, started on levofloxacin cephalosporins allergy.  Hypernatremia:  -Proving, but remains significantly elevated at 153 today, continue with D5W, will have increased his free water via core track.  Hypokalemia: Improved - Continue IVF supplementation  AKI:  -Getting improving after started IV fluids.  A. fib with RVR:  - Successfully off diltiazem infusion for the time being. Will order diltiazem po now that taking some pills and EF reportedly >50%. Still not consistently taking any pills, so will continue IV metoprolol as well.  - Continue ASA 81mg  as he is significant fall risk and possible hx GI bleeding, so not on full dose anticoagulation.    Chronic combined heart failure: Remains compensated, latest EF showed improvement per family report.  - Watch volume status while on IV fluids.  Hold diuretic given hypernatremia and AKI with poor per oral intake.  Dementia: Appears to be advanced  - Resume namenda once oral intake is slightly better.  NPH: Stable for outpatient follow-up with outpatient MDs  History of liver cirrhosis: Compensated - Monitor LFTs while on remdesivir, modestly elevated.  - Continue outpatient follow-up  Failure to thrive: Very weak/debilitated at baseline-now has encephalopathy from Covid and electrolyte abnormalities.  -Started on tube feed , at her phosphorus level closely   palliative care: DNR in place.  Spoke with granddaughter Dr. Josephine Cables who is a hospitalist with the [facility] system.  Goals of care are for gentle medical treatment-family does not desire aggressive care.  Deep tissue, pressure injury to left heel, POA: Pressure Injury 11/12/18 Heel Left Deep Tissue Injury - Purple or maroon localized area of discolored intact skin or blood-filled blister due to damage of underlying soft  tissue from pressure and/or shear. maroon with yellow blister-type area in center;  (Active)  11/12/18   Location: Heel  Location Orientation:  Left  Staging: Deep Tissue Injury - Purple or maroon localized area of discolored intact skin or blood-filled blister due to damage of underlying soft tissue from pressure and/or shear.  Wound Description (Comments): maroon with yellow blister-type area in center; boggy  Present on Admission: Yes    DVT prophylaxis: Lovenox Code Status: DNR Family Communication: Granddaughter by phone updated daily Disposition Plan: Uncertain,   Consultants:   None  Procedures:   None  Antimicrobials:  Remdesivir    Objective: Vitals:   11/18/18 0400 11/18/18 0729 11/18/18 1018 11/18/18 1100  BP: 103/65 109/62  97/73  Pulse: 81 96 99 77  Resp: 20 14  17   Temp: 98.2 F (36.8 C) 97.9 F (36.6 C)  97.8 F (36.6 C)  TempSrc: Axillary Oral  Axillary  SpO2:  94%    Weight: 76.4 kg     Height: 6' (1.829 m)       Intake/Output Summary (Last 24 hours) at 11/18/2018 1217 Last data filed at 11/18/2018 0935 Gross per 24 hour  Intake 1844.98 ml  Output 300 ml  Net 1544.98 ml   Filed Weights   11/13/18 0729 11/18/18 0400  Weight: 86.2 kg 76.4 kg   Extremely frail, chronically ill-appearing, does not follow commands or answer questions, but grimaces to painful stimuli . Symmetrical chest wall movement, good air entry bilaterally, no wheezing  Irregular irregular, tachycardic, no rubs or gallops  Bowel sounds present, abdomen nontender, nondistended  Extremitis with no edema, clubbing or cyanosis .  Data Reviewed: I have personally reviewed following labs and imaging studies  CBC: Recent Labs  Lab 11/13/18 0648 11/14/18 0054 11/15/18 0315 11/16/18 0450 11/17/18 0605 11/18/18 0535  WBC 8.5 12.9* 10.4 9.1 9.7 14.8*  NEUTROABS 7.4 11.0* 8.2* 7.1 8.3*  --   HGB 18.3* 17.5* 17.3* 17.4* 17.8* 16.0  HCT 55.1* 54.2* 53.5* 55.0* 55.4* 49.9  MCV 96.2 97.3 97.8 98.7 100.0 99.0  PLT 242 264 243 241 240 [id number]   Basic Metabolic Panel: Recent Labs  Lab 11/14/18 0230 11/15/18 0315 11/16/18  0450 11/17/18 0605 11/17/18 1550 11/18/18 0535  NA  --  149* 155* 157* 154* 153*  K  --  3.9 3.8 4.3 3.8 4.1  CL  --  111 118* 118* 118* 119*  CO2  --  26 30 29 28 26   GLUCOSE  --  93 104* 154* 174* 224*  BUN  --  51* 36* 40* 41* 46*  CREATININE  --  1.36* 1.19 1.55* 1.46* 1.32*  CALCIUM  --  9.0 9.0 9.0 8.7* 8.7*  MG 2.8*  --   --   --  2.8* 2.9*  PHOS  --   --   --   --  2.8 2.4*   GFR: Estimated Creatinine Clearance: 39.4 mL/min (A) (by C-G formula based on SCr of 1.32 mg/dL (H)). Liver Function Tests: Recent Labs  Lab 11/14/18 0054 11/15/18 0315 11/16/18 0450 11/17/18 0605 11/18/18 0535  AST 72* 59* 50* 36 24  ALT 46* 47* 49* 40 29  ALKPHOS 76 74 81 87 85  BILITOT 1.3* 1.3* 2.1* 2.3* 1.6*  PROT 6.5 6.4* 6.2* 6.5 5.5*  ALBUMIN 3.1* 3.1* 3.0* 3.0* 2.6*   No results for input(s): LIPASE, AMYLASE in the last 168 hours. No results for input(s): AMMONIA in the last 168 hours. Coagulation Profile: No results for  input(s): INR, PROTIME in the last 168 hours. Cardiac Enzymes: No results for input(s): CKTOTAL, CKMB, CKMBINDEX, TROPONINI in the last 168 hours. BNP (last 3 results) No results for input(s): PROBNP in the last 8760 hours. HbA1C: Recent Labs    11/17/18 1550  HGBA1C 6.1*   CBG: Recent Labs  Lab 11/17/18 1956 11/18/18 0007 11/18/18 0433 11/18/18 0750 11/18/18 1120  GLUCAP 175* 192* 196* 178* 101*   Lipid Profile: No results for input(s): CHOL, HDL, LDLCALC, TRIG, CHOLHDL, LDLDIRECT in the last 72 hours. Thyroid Function Tests: No results for input(s): TSH, T4TOTAL, FREET4, T3FREE, THYROIDAB in the last 72 hours. Anemia Panel: Recent Labs    11/17/18 0605 11/18/18 0535  FERRITIN 461* 394*   Urine analysis:    Component Value Date/Time   COLORURINE AMBER (A) 11/18/2018 0200   APPEARANCEUR HAZY (A) 11/18/2018 0200   LABSPEC 1.014 11/18/2018 0200   PHURINE 7.0 11/18/2018 0200   GLUCOSEU NEGATIVE 11/18/2018 0200   HGBUR NEGATIVE 11/18/2018  0200   BILIRUBINUR NEGATIVE 11/18/2018 0200   KETONESUR NEGATIVE 11/18/2018 0200   PROTEINUR 100 (A) 11/18/2018 0200   NITRITE NEGATIVE 11/18/2018 0200   LEUKOCYTESUR SMALL (A) 11/18/2018 0200   Recent Results (from the past 240 hour(s))  Blood Culture (routine x 2)     Status: None   Collection Time: 11/12/18 12:30 PM   Specimen: BLOOD  Result Value Ref Range Status   Specimen Description BLOOD BLOOD LEFT FOREARM  Final   Special Requests   Final    BOTTLES DRAWN AEROBIC AND ANAEROBIC Blood Culture results may not be optimal due to an inadequate volume of blood received in culture bottles   Culture   Final    NO GROWTH 5 DAYS Performed at [facility], [address] [place], [place] [address]    Report Status 11/17/2018 FINAL  Final  Blood Culture (routine x 2)     Status: None   Collection Time: 11/12/18 12:52 PM   Specimen: BLOOD  Result Value Ref Range Status   Specimen Description BLOOD BLOOD RIGHT FOREARM  Final   Special Requests   Final    BOTTLES DRAWN AEROBIC AND ANAEROBIC Blood Culture adequate volume   Culture   Final    NO GROWTH 5 DAYS Performed at [facility], [place] [address] [place], [place] [address]    Report Status 11/17/2018 FINAL  Final  MRSA PCR Screening     Status: None   Collection Time: 11/13/18  2:20 AM   Specimen: Nasopharyngeal  Result Value Ref Range Status   MRSA by PCR NEGATIVE NEGATIVE Final    Comment:        The GeneXpert MRSA Assay (FDA approved for NASAL specimens only), is one component of a comprehensive MRSA colonization surveillance program. It is not intended to diagnose MRSA infection nor to guide or monitor treatment for MRSA infections. Performed at [facility], [place] [address], [place],  [address]       Radiology Studies: Dg Chest Port 1 View  Result Date: 11/17/2018 CLINICAL DATA:  Hypoxia. EXAM: PORTABLE CHEST 1 VIEW COMPARISON:  11/12/2018 FINDINGS: Left-sided  pacemaker unchanged. Lungs are somewhat hypoinflated with minimal persistent patchy density left base. Improved right midlung opacification. No effusion. Cardiomediastinal silhouette and remainder of the exam is unchanged. IMPRESSION: Hypoinflation with persistent mild patchy left basilar opacification and improved right midlung opacification. Electronically Signed   By: Marin Olp M.D.   On: 11/17/2018 15:51    Scheduled  Meds: . aspirin  81 mg Oral Daily  . dexamethasone (DECADRON) injection  6 mg Intravenous Daily  . diltiazem  120 mg Oral Daily  . dutasteride  0.5 mg Oral Daily  . enoxaparin (LOVENOX) injection  40 mg Subcutaneous Q24H  . feeding supplement (ENSURE ENLIVE)  237 mL Oral BID BM  . feeding supplement (PRO-STAT SUGAR FREE 64)  30 mL Per Tube Daily  . free water  100 mL Per Tube Q6H  . insulin aspart  0-9 Units Subcutaneous TID WC  . memantine  10 mg Oral BID  . metoprolol tartrate  2.5 mg Intravenous Q8H  . mirtazapine  15 mg Oral QHS  . multivitamin with minerals  1 tablet Oral Daily  . nystatin  1 g Topical BID   Continuous Infusions: . dextrose 75 mL/hr at 11/18/18 0534  . feeding supplement (OSMOLITE 1.2 CAL) 70 mL/hr at 11/17/18 1941  . [START ON 11/19/2018] levofloxacin (LEVAQUIN) IV       LOS: 6 days    Phillips Climes, MD [facility] [email or web address] 11/18/2018, 12:17 PM

## 2018-11-19 ENCOUNTER — Inpatient Hospital Stay (HOSPITAL_COMMUNITY): Payer: Medicare Other

## 2018-11-19 DIAGNOSIS — N390 Urinary tract infection, site not specified: Secondary | ICD-10-CM

## 2018-11-19 LAB — CBC
HCT: 48.1 % (ref 39.0–52.0)
Hemoglobin: 15.2 g/dL (ref 13.0–17.0)
MCH: 31.4 pg (ref 26.0–34.0)
MCHC: 31.6 g/dL (ref 30.0–36.0)
MCV: 99.4 fL (ref 80.0–100.0)
Platelets: 219 10*3/uL (ref 150–400)
RBC: 4.84 MIL/uL (ref 4.22–5.81)
RDW: 13.2 % (ref 11.5–15.5)
WBC: 12.2 10*3/uL — ABNORMAL HIGH (ref 4.0–10.5)
nRBC: 0 % (ref 0.0–0.2)

## 2018-11-19 LAB — COMPREHENSIVE METABOLIC PANEL
ALT: 24 U/L (ref 0–44)
AST: 23 U/L (ref 15–41)
Albumin: 2.4 g/dL — ABNORMAL LOW (ref 3.5–5.0)
Alkaline Phosphatase: 83 U/L (ref 38–126)
Anion gap: 10 (ref 5–15)
BUN: 40 mg/dL — ABNORMAL HIGH (ref 8–23)
CO2: 26 mmol/L (ref 22–32)
Calcium: 8.4 mg/dL — ABNORMAL LOW (ref 8.9–10.3)
Chloride: 114 mmol/L — ABNORMAL HIGH (ref 98–111)
Creatinine, Ser: 1.22 mg/dL (ref 0.61–1.24)
GFR calc Af Amer: 60 mL/min — ABNORMAL LOW (ref >60)
GFR calc non Af Amer: 52 mL/min — ABNORMAL LOW (ref >60)
Glucose, Bld: 125 mg/dL — ABNORMAL HIGH (ref 70–99)
Potassium: 4.3 mmol/L (ref 3.5–5.1)
Sodium: 150 mmol/L — ABNORMAL HIGH (ref 135–145)
Total Bilirubin: 1.2 mg/dL (ref 0.3–1.2)
Total Protein: 5.2 g/dL — ABNORMAL LOW (ref 6.5–8.1)

## 2018-11-19 LAB — GLUCOSE, CAPILLARY
Glucose-Capillary: 133 mg/dL — ABNORMAL HIGH (ref 70–99)
Glucose-Capillary: 154 mg/dL — ABNORMAL HIGH (ref 70–99)
Glucose-Capillary: 157 mg/dL — ABNORMAL HIGH (ref 70–99)
Glucose-Capillary: 162 mg/dL — ABNORMAL HIGH (ref 70–99)
Glucose-Capillary: 168 mg/dL — ABNORMAL HIGH (ref 70–99)
Glucose-Capillary: 296 mg/dL — ABNORMAL HIGH (ref 70–99)

## 2018-11-19 LAB — FERRITIN: Ferritin: 369 ng/mL — ABNORMAL HIGH (ref 24–336)

## 2018-11-19 LAB — MAGNESIUM: Magnesium: 2.7 mg/dL — ABNORMAL HIGH (ref 1.7–2.4)

## 2018-11-19 LAB — C-REACTIVE PROTEIN: CRP: 3.1 mg/dL — ABNORMAL HIGH (ref <1.0)

## 2018-11-19 LAB — PHOSPHORUS: Phosphorus: 3.1 mg/dL (ref 2.5–4.6)

## 2018-11-19 LAB — PROCALCITONIN: Procalcitonin: <0.1 ng/mL

## 2018-11-19 MED ORDER — SCOPOLAMINE 1 MG/3DAYS TD PT72
1.0000 | MEDICATED_PATCH | TRANSDERMAL | Status: DC
  Administered 2018-11-19 – 2018-11-25 (×3): 1.5 mg via TRANSDERMAL
  Filled 2018-11-19 (×3): qty 1

## 2018-11-19 MED ORDER — SODIUM CHLORIDE 0.9 % IV SOLN
3.0000 g | Freq: Three times a day (TID) | INTRAVENOUS | Status: DC
  Administered 2018-11-19 – 2018-11-22 (×9): 3 g via INTRAVENOUS
  Filled 2018-11-19 (×2): qty 8
  Filled 2018-11-19 (×2): qty 3
  Filled 2018-11-19 (×3): qty 8
  Filled 2018-11-19 (×2): qty 3
  Filled 2018-11-19: qty 8
  Filled 2018-11-19: qty 3
  Filled 2018-11-19: qty 8

## 2018-11-19 MED ORDER — GLYCOPYRROLATE 0.2 MG/ML IJ SOLN
0.1000 mg | Freq: Once | INTRAMUSCULAR | Status: AC
  Administered 2018-11-19: 0.1 mg via INTRAVENOUS
  Filled 2018-11-19: qty 1

## 2018-11-19 MED ORDER — FREE WATER
350.0000 mL | Status: DC
  Administered 2018-11-19 – 2018-11-21 (×14): 350 mL

## 2018-11-19 NOTE — Plan of Care (Signed)
  Problem: Respiratory: Goal: Will maintain a patent airway Outcome: Progressing Goal: Complications related to the disease process, condition or treatment will be avoided or minimized Outcome: Progressing   

## 2018-11-19 NOTE — Progress Notes (Signed)
PROGRESS NOTE  Stephen Roth.  [id number] DOB: 03/16/1925 DOA: 11/12/2018 PCP: Venia Carbon, MD   Brief Narrative: Patient is a 85 y.o. male with PMHx of dementia, chronic systolic heart failure-s/p AICD, cirrhosis, NPH-resident of a skilled nursing facility-presented to [facility] ED on 10/30 with worsening confusion, found to have COVID-19 viral pneumonia, hyponatremia and A. fib with RVR.  See below for further details.  Per ED note at [facility]-patient was diagnosed with COVID-19 at [facility] skilled nursing facility in [place] approximately 12 days prior to admission.  Subjective: Discussed with staff, no significant events overnight  Assessment & Plan: Principal Problem:   Acute encephalopathy Active Problems:   Hypernatremia   AKI (acute kidney injury) ([place])   Dementia without behavioral disturbance ([place])   COVID-19 virus infection   Atrial fibrillation with RVR ([facility])   Pressure ulcer of left heel  Covid-19 pneumonia:  -Is requiring oxygen intermittently , morning he was on 2 L nasal cannula, but saturating around 96%. - Received  remdesivir 10/31 - 11/4 -Continue with IV Decadron, started after he started to develop hypoxia -Patient is extremely frail, with altered mental status, unable to comply with incentive spirometry. -foIlow inflammatory markers closely.Marland Kitchen  COVID-19 Labs  Recent Labs    11/17/18 0605 11/18/18 0500 11/18/18 0535 11/19/18 0131  DDIMER 1.50*  --   --   --   FERRITIN 461*  --  394* 369*  CRP 10.2* 5.6*  --  3.1*    No results found for: 0000000    Acute metabolic encephalopathy on chronic advanced dementia:  - Acute metabolic encephalopathy likely multifactorial-secondary to COVID-19 infection, hypernatremia and AKI. CT head without acute findings.  - Delirium precautions, supportive care. -Patient remains significantly altered and obtunded, does not follow commands or answer any questions.  UTI -Patient with positive UA,  urine culture growing Proteus mirabilis and Enterococcus faecalis, initially on levofloxacin will discontinue for now given his old age and facility will await sensitivity to adjust antibiotic regimen, will discuss with family to see if patient is able to tolerate any penicillins recently, if he did then will start on Unasyn,  Hypernatremia:  -Improving, but still elevated at 150 today, will increase D5W to 100 cc/h, as well increasing his free water via core track  Hypokalemia: Improved - Continue IVF supplementation  AKI:  -Getting improving after started IV fluids.  A. fib with RVR:  -Initially required diltiazem drip, and IV metoprolol pushes, now his having core track, heart rate is controlled on Cardizem CD . - Continue ASA 81mg  as he is significant fall risk and possible hx GI bleeding, so not on full dose anticoagulation.    Chronic combined heart failure: Remains compensated, latest EF showed improvement per family report.  - Watch volume status while on IV fluids.  Hold diuretic given hypernatremia and AKI with poor per oral intake.  Dementia: Appears to be advanced  - Resume namenda once oral intake is slightly better.  NPH: Stable for outpatient follow-up with outpatient MDs  History of liver cirrhosis: Compensated - Monitor LFTs while on remdesivir, modestly elevated.  - Continue outpatient follow-up  Failure to thrive: Very weak/debilitated at baseline-now has encephalopathy from Covid and electrolyte abnormalities.  -Started on tube feed , at her phosphorus level closely   palliative care: DNR in place.  Spoke with granddaughter Dr. Josephine Cables who is a hospitalist with the [facility] system.  Goals of care are for gentle medical treatment-family does not desire aggressive care.  Deep tissue, pressure injury to left heel, POA: Pressure Injury 11/12/18 Heel Left Deep Tissue Injury - Purple or maroon localized area of discolored intact skin or blood-filled blister  due to damage of underlying soft tissue from pressure and/or shear. maroon with yellow blister-type area in center;  (Active)  11/12/18   Location: Heel  Location Orientation: Left  Staging: Deep Tissue Injury - Purple or maroon localized area of discolored intact skin or blood-filled blister due to damage of underlying soft tissue from pressure and/or shear.  Wound Description (Comments): maroon with yellow blister-type area in center; boggy  Present on Admission: Yes    DVT prophylaxis: Lovenox Code Status: DNR Family Communication: Granddaughter by phone updated daily Disposition Plan: Uncertain,   Consultants:   None  Procedures:   None  Antimicrobials:  Remdesivir    Objective: Vitals:   11/19/18 0400 11/19/18 0411 11/19/18 0736 11/19/18 1137  BP: 106/64  114/69 101/62  Pulse: 71  62 62  Resp: 20  15 15   Temp: 97.7 F (36.5 C)  97.8 F (36.6 C) 97.8 F (36.6 C)  TempSrc: Axillary  Axillary Axillary  SpO2: 99%  97% 98%  Weight:  77.6 kg    Height:        Intake/Output Summary (Last 24 hours) at 11/19/2018 1412 Last data filed at 11/19/2018 0600 Gross per 24 hour  Intake 3041.5 ml  Output 450 ml  Net 2591.5 ml   Filed Weights   11/13/18 0729 11/18/18 0400 11/19/18 0411  Weight: 86.2 kg 76.4 kg 77.6 kg   Lethargic, still minimally responsive, extremely frail, chronically ill-appearing Symmetrical Chest wall movement, Good air movement bilaterally, CTAB RRR,No Gallops,Rubs or new Murmurs, No Parasternal Heave +ve B.Sounds, Abd Soft, No tenderness, No rebound - guarding or rigidity. No Cyanosis, Clubbing or edema, No new Rash or bruise    Data Reviewed: I have personally reviewed following labs and imaging studies  CBC: Recent Labs  Lab 11/13/18 0648 11/14/18 0054 11/15/18 0315 11/16/18 0450 11/17/18 0605 11/18/18 0535 11/19/18 0131  WBC 8.5 12.9* 10.4 9.1 9.7 14.8* 12.2*  NEUTROABS 7.4 11.0* 8.2* 7.1 8.3*  --   --   HGB 18.3* 17.5* 17.3* 17.4*  17.8* 16.0 15.2  HCT 55.1* 54.2* 53.5* 55.0* 55.4* 49.9 48.1  MCV 96.2 97.3 97.8 98.7 100.0 99.0 99.4  PLT 242 264 243 241 240 256 [id number]   Basic Metabolic Panel: Recent Labs  Lab 11/14/18 0230  11/16/18 0450 11/17/18 0605 11/17/18 1550 11/18/18 0535 11/19/18 0131  NA  --    < > 155* 157* 154* 153* 150*  K  --    < > 3.8 4.3 3.8 4.1 4.3  CL  --    < > 118* 118* 118* 119* 114*  CO2  --    < > 30 29 28 26 26   GLUCOSE  --    < > 104* 154* 174* 224* 125*  BUN  --    < > 36* 40* 41* 46* 40*  CREATININE  --    < > 1.19 1.55* 1.46* 1.32* 1.22  CALCIUM  --    < > 9.0 9.0 8.7* 8.7* 8.4*  MG 2.8*  --   --   --  2.8* 2.9* 2.7*  PHOS  --   --   --   --  2.8 2.4* 3.1   < > = values in this interval not displayed.   GFR: Estimated Creatinine Clearance: 43.3 mL/min (by C-G formula based on SCr of  1.22 mg/dL). Liver Function Tests: Recent Labs  Lab 11/15/18 0315 11/16/18 0450 11/17/18 0605 11/18/18 0535 11/19/18 0131  AST 59* 50* 36 24 23  ALT 47* 49* 40 29 24  ALKPHOS 74 81 87 85 83  BILITOT 1.3* 2.1* 2.3* 1.6* 1.2  PROT 6.4* 6.2* 6.5 5.5* 5.2*  ALBUMIN 3.1* 3.0* 3.0* 2.6* 2.4*   No results for input(s): LIPASE, AMYLASE in the last 168 hours. No results for input(s): AMMONIA in the last 168 hours. Coagulation Profile: No results for input(s): INR, PROTIME in the last 168 hours. Cardiac Enzymes: No results for input(s): CKTOTAL, CKMB, CKMBINDEX, TROPONINI in the last 168 hours. BNP (last 3 results) No results for input(s): PROBNP in the last 8760 hours. HbA1C: Recent Labs    11/17/18 1550  HGBA1C 6.1*   CBG: Recent Labs  Lab 11/18/18 1527 11/18/18 2006 11/19/18 0005 11/19/18 0409 11/19/18 0822  GLUCAP 238* 209* 154* 162* 133*   Lipid Profile: No results for input(s): CHOL, HDL, LDLCALC, TRIG, CHOLHDL, LDLDIRECT in the last 72 hours. Thyroid Function Tests: No results for input(s): TSH, T4TOTAL, FREET4, T3FREE, THYROIDAB in the last 72 hours. Anemia Panel: Recent  Labs    11/18/18 0535 11/19/18 0131  FERRITIN 394* 369*   Urine analysis:    Component Value Date/Time   COLORURINE AMBER (A) 11/18/2018 0200   APPEARANCEUR HAZY (A) 11/18/2018 0200   LABSPEC 1.014 11/18/2018 0200   PHURINE 7.0 11/18/2018 0200   GLUCOSEU NEGATIVE 11/18/2018 0200   HGBUR NEGATIVE 11/18/2018 0200   BILIRUBINUR NEGATIVE 11/18/2018 0200   KETONESUR NEGATIVE 11/18/2018 0200   PROTEINUR 100 (A) 11/18/2018 0200   NITRITE NEGATIVE 11/18/2018 0200   LEUKOCYTESUR SMALL (A) 11/18/2018 0200   Recent Results (from the past 240 hour(s))  Blood Culture (routine x 2)     Status: None   Collection Time: 11/12/18 12:30 PM   Specimen: BLOOD  Result Value Ref Range Status   Specimen Description BLOOD BLOOD LEFT FOREARM  Final   Special Requests   Final    BOTTLES DRAWN AEROBIC AND ANAEROBIC Blood Culture results may not be optimal due to an inadequate volume of blood received in culture bottles   Culture   Final    NO GROWTH 5 DAYS Performed at [facility], [address] [place], [place] [address]    Report Status 11/17/2018 FINAL  Final  Blood Culture (routine x 2)     Status: None   Collection Time: 11/12/18 12:52 PM   Specimen: BLOOD  Result Value Ref Range Status   Specimen Description BLOOD BLOOD RIGHT FOREARM  Final   Special Requests   Final    BOTTLES DRAWN AEROBIC AND ANAEROBIC Blood Culture adequate volume   Culture   Final    NO GROWTH 5 DAYS Performed at [facility], [address] [place], [place] [address]    Report Status 11/17/2018 FINAL  Final  MRSA PCR Screening     Status: None   Collection Time: 11/13/18  2:20 AM   Specimen: Nasopharyngeal  Result Value Ref Range Status   MRSA by PCR NEGATIVE NEGATIVE Final    Comment:        The GeneXpert MRSA Assay (FDA approved for NASAL specimens only), is one component of a comprehensive MRSA colonization surveillance program. It is not intended to diagnose MRSA infection nor to  guide or monitor treatment for MRSA infections. Performed at [facility], [place] [address], [place], [place] [address]  Culture, Urine     Status: Abnormal (Preliminary result)   Collection Time: 11/18/18  2:00 AM   Specimen: Urine, Random  Result Value Ref Range Status   Specimen Description   Final    URINE, RANDOM Performed at [facility] [address] [place], [place] [address]    Special Requests   Final    NONE Performed at [facility], [place] [address] [place], [place] [address]    Culture (A)  Final    >=100,000 COLONIES/mL PROTEUS MIRABILIS >=100,000 COLONIES/mL ENTEROCOCCUS FAECALIS SUSCEPTIBILITIES TO FOLLOW Performed at [facility], [place] [address], [place],  [address]    Report Status PENDING  Incomplete      Radiology Studies: Dg Chest Port 1 View  Result Date: 11/17/2018 CLINICAL DATA:  Hypoxia. EXAM: PORTABLE CHEST 1 VIEW COMPARISON:  11/12/2018 FINDINGS: Left-sided pacemaker unchanged. Lungs are somewhat hypoinflated with minimal persistent patchy density left base. Improved right midlung opacification. No effusion. Cardiomediastinal silhouette and remainder of the exam is unchanged. IMPRESSION: Hypoinflation with persistent mild patchy left basilar opacification and improved right midlung opacification. Electronically Signed   By: Marin Olp M.D.   On: 11/17/2018 15:51    Scheduled Meds: . aspirin  81 mg Oral Daily  . dexamethasone (DECADRON) injection  6 mg Intravenous Daily  . diltiazem  120 mg Oral Daily  . dutasteride  0.5 mg Oral Daily  . enoxaparin (LOVENOX) injection  40 mg Subcutaneous Q24H  . feeding supplement (ENSURE ENLIVE)  237 mL Oral BID BM  . feeding supplement (PRO-STAT SUGAR FREE 64)  30 mL Per Tube Daily  . free water  350 mL Per Tube Q3H  . insulin aspart  0-9 Units Subcutaneous TID WC  . memantine  10 mg Oral BID  . metoprolol tartrate  2.5 mg Intravenous Q8H   . mirtazapine  15 mg Oral QHS  . multivitamin with minerals  1 tablet Oral Daily  . nystatin  1 g Topical BID  . phosphorus  250 mg Per Tube TID   Continuous Infusions: . dextrose 100 mL/hr at 11/19/18 0824  . feeding supplement (OSMOLITE 1.2 CAL) 70 mL/hr at 11/19/18 1247     LOS: 7 days    Phillips Climes, MD [facility] [email or web address] 11/19/2018, 2:12 PM

## 2018-11-19 NOTE — Progress Notes (Signed)
SLP Cancellation Note  Patient Details Name: Stephen Roth. MRN: [id number] DOB: 10-23-1925   Cancelled treatment:       Reason Eval/Treat Not Completed: Fatigue/lethargy limiting ability to participate   Talbert Nan 11/19/2018, 2:04 PM  Pollyann Glen, M.A. [facility] Environmental education officer [phone] Office [phone]

## 2018-11-19 NOTE — Progress Notes (Signed)
Pharmacy Antibiotic Note  Stephen Roth. is a 85 y.o. male admitted on 11/12/2018 with COVID-19 pneumonia.  MD wanting to treat positive urine culture (Proteus and Enterococcus) as well as possible aspiration. Pharmacy has been consulted for Unasyn dosing. MD aware of noted allergy to cephalosporin, will monitor closely for reaction.  Plan: Unasyn 3g IV q8h Monitor for any drug reaction Follow up renal function and adjust as needed  Height: 6' (182.9 cm) Weight: 171 lb 1.2 oz (77.6 kg) IBW/kg (Calculated) : 77.6  Temp (24hrs), Avg:97.8 F (36.6 C), Min:97.7 F (36.5 C), Max:97.8 F (36.6 C)  Recent Labs  Lab 11/15/18 0315 11/16/18 0450 11/17/18 0605 11/17/18 1550 11/18/18 0535 11/19/18 0131  WBC 10.4 9.1 9.7  --  14.8* 12.2*  CREATININE 1.36* 1.19 1.55* 1.46* 1.32* 1.22  LATICACIDVEN  --   --   --  2.1*  --   --     Estimated Creatinine Clearance: 43.3 mL/min (by C-G formula based on SCr of 1.22 mg/dL).    Allergies  Allergen Reactions  . Cephalexin Other (See Comments)    Confusion, hives, hallucinatons  . Clindamycin/Lincomycin Other (See Comments)    Confusion, hives, hallucinations  . Morphine And Related Other (See Comments)    Antimicrobials this admission: 10/31 Remdesivir >> 11/4 11/5 Levaquin x 1 dose 11/6 Unasyn >>  Dose adjustments this admission:  Microbiology results: 10/31 MRSA PCR: negative 11/5 UCx: >100k Proteus mirabilis, >100k Enterococcus  Thank you for allowing pharmacy to be a part of this patient's care.  Peggyann Juba, PharmD, BCPS Pharmacy: [phone] 11/19/2018 7:35 PM

## 2018-11-20 ENCOUNTER — Inpatient Hospital Stay (HOSPITAL_COMMUNITY): Payer: Medicare Other

## 2018-11-20 LAB — GLUCOSE, CAPILLARY
Glucose-Capillary: 108 mg/dL — ABNORMAL HIGH (ref 70–99)
Glucose-Capillary: 126 mg/dL — ABNORMAL HIGH (ref 70–99)
Glucose-Capillary: 129 mg/dL — ABNORMAL HIGH (ref 70–99)
Glucose-Capillary: 138 mg/dL — ABNORMAL HIGH (ref 70–99)
Glucose-Capillary: 85 mg/dL (ref 70–99)

## 2018-11-20 LAB — FERRITIN: Ferritin: 363 ng/mL — ABNORMAL HIGH (ref 24–336)

## 2018-11-20 LAB — COMPREHENSIVE METABOLIC PANEL
ALT: 28 U/L (ref 0–44)
AST: 29 U/L (ref 15–41)
Albumin: 2.4 g/dL — ABNORMAL LOW (ref 3.5–5.0)
Alkaline Phosphatase: 91 U/L (ref 38–126)
Anion gap: 9 (ref 5–15)
BUN: 28 mg/dL — ABNORMAL HIGH (ref 8–23)
CO2: 30 mmol/L (ref 22–32)
Calcium: 8.5 mg/dL — ABNORMAL LOW (ref 8.9–10.3)
Chloride: 104 mmol/L (ref 98–111)
Creatinine, Ser: 0.91 mg/dL (ref 0.61–1.24)
GFR calc Af Amer: >60 mL/min (ref >60)
GFR calc non Af Amer: >60 mL/min (ref >60)
Glucose, Bld: 142 mg/dL — ABNORMAL HIGH (ref 70–99)
Potassium: 4.7 mmol/L (ref 3.5–5.1)
Sodium: 143 mmol/L (ref 135–145)
Total Bilirubin: 1.2 mg/dL (ref 0.3–1.2)
Total Protein: 5.5 g/dL — ABNORMAL LOW (ref 6.5–8.1)

## 2018-11-20 LAB — URINE CULTURE: Culture: >=100000 — AB

## 2018-11-20 LAB — C-REACTIVE PROTEIN: CRP: 1.9 mg/dL — ABNORMAL HIGH (ref <1.0)

## 2018-11-20 LAB — CBC
HCT: 50.5 % (ref 39.0–52.0)
Hemoglobin: 16.3 g/dL (ref 13.0–17.0)
MCH: 31.6 pg (ref 26.0–34.0)
MCHC: 32.3 g/dL (ref 30.0–36.0)
MCV: 97.9 fL (ref 80.0–100.0)
Platelets: 211 10*3/uL (ref 150–400)
RBC: 5.16 MIL/uL (ref 4.22–5.81)
RDW: 13.1 % (ref 11.5–15.5)
WBC: 13.6 10*3/uL — ABNORMAL HIGH (ref 4.0–10.5)
nRBC: 0 % (ref 0.0–0.2)

## 2018-11-20 LAB — PHOSPHORUS: Phosphorus: 3.2 mg/dL (ref 2.5–4.6)

## 2018-11-20 LAB — MAGNESIUM: Magnesium: 2.4 mg/dL (ref 1.7–2.4)

## 2018-11-20 MED ORDER — POLYETHYLENE GLYCOL 3350 17 G PO PACK
17.0000 g | PACK | Freq: Once | ORAL | Status: AC
  Administered 2018-11-20: 17 g
  Filled 2018-11-20: qty 1

## 2018-11-20 MED ORDER — ADULT MULTIVITAMIN LIQUID CH
15.0000 mL | Freq: Every day | ORAL | Status: DC
  Administered 2018-11-20 – 2018-11-23 (×4): 15 mL
  Filled 2018-11-20 (×6): qty 15

## 2018-11-20 MED ORDER — RISAQUAD PO CAPS
1.0000 | ORAL_CAPSULE | Freq: Two times a day (BID) | ORAL | Status: DC
  Administered 2018-11-20 – 2018-11-24 (×9): 1 via ORAL
  Filled 2018-11-20 (×9): qty 1

## 2018-11-20 MED ORDER — ASPIRIN 81 MG PO CHEW: 81.0000 mg | CHEWABLE_TABLET | Freq: Every day | ORAL | Status: DC

## 2018-11-20 MED ORDER — BISACODYL 10 MG RE SUPP
10.0000 mg | Freq: Once | RECTAL | Status: AC
  Administered 2018-11-20: 10 mg via RECTAL
  Filled 2018-11-20: qty 1

## 2018-11-20 MED ORDER — SENNOSIDES 8.8 MG/5ML PO SYRP
10.0000 mL | ORAL_SOLUTION | Freq: Two times a day (BID) | ORAL | Status: AC
  Administered 2018-11-20 (×2): 10 mL via ORAL
  Filled 2018-11-20 (×2): qty 10

## 2018-11-20 MED ORDER — ACETAMINOPHEN 160 MG/5ML PO SOLN
650.0000 mg | Freq: Four times a day (QID) | ORAL | Status: DC | PRN
  Administered 2018-11-21 (×2): 650 mg
  Filled 2018-11-20 (×3): qty 20.3

## 2018-11-20 MED ORDER — SACCHAROMYCES BOULARDII 250 MG PO CAPS
250.0000 mg | ORAL_CAPSULE | Freq: Two times a day (BID) | ORAL | Status: DC
  Filled 2018-11-20: qty 1

## 2018-11-20 MED ORDER — DILTIAZEM 12 MG/ML ORAL SUSPENSION
30.0000 mg | Freq: Four times a day (QID) | ORAL | Status: DC
  Administered 2018-11-20 – 2018-11-25 (×17): 30 mg
  Filled 2018-11-20 (×21): qty 3

## 2018-11-20 MED ORDER — ONDANSETRON HCL 4 MG PO TABS: 4.0000 mg | ORAL_TABLET | Freq: Four times a day (QID) | ORAL | Status: DC | PRN

## 2018-11-20 MED ORDER — ADULT MULTIVITAMIN LIQUID CH
15.0000 mL | Freq: Every day | ORAL | Status: DC
  Filled 2018-11-20 (×2): qty 15

## 2018-11-20 MED ORDER — ASPIRIN 81 MG PO CHEW
81.0000 mg | CHEWABLE_TABLET | Freq: Every day | ORAL | Status: DC
  Administered 2018-11-20 – 2018-11-24 (×5): 81 mg
  Filled 2018-11-20 (×4): qty 1

## 2018-11-20 NOTE — Progress Notes (Addendum)
Attempted to call [name] (Daughter) for update. No answer.  Updated [name], patient facetimed with son and wife.

## 2018-11-20 NOTE — Progress Notes (Signed)
PROGRESS NOTE  Stephen Roth.  [id number] DOB: 1925/05/02 DOA: 11/12/2018 PCP: Venia Carbon, MD   Brief Narrative: Patient is a 85 y.o. male with PMHx of dementia, chronic systolic heart failure-s/p AICD, cirrhosis, NPH-resident of a skilled nursing facility-presented to [facility] ED on 10/30 with worsening confusion, found to have COVID-19 viral pneumonia, hyponatremia and A. fib with RVR.  See below for further details.  Per ED note at [facility]-patient was diagnosed with COVID-19 at [facility] skilled nursing facility in [place] approximately 12 days prior to admission.  Subjective: Discussed with staff, no significant events overnight, patient with some episodes of gurgling overnight where he required frequent suctioning.  Assessment & Plan: Principal Problem:   Acute encephalopathy Active Problems:   Hypernatremia   AKI (acute kidney injury) ([place])   Dementia without behavioral disturbance ([place])   COVID-19 virus infection   Atrial fibrillation with RVR ([facility])   Pressure ulcer of left heel  Covid-19 pneumonia:  -Is requiring oxygen intermittently , did go up to 4 L nasal cannula yesterday, but this morning he is back on 2 L nasal cannula . - Received  remdesivir 10/31 - 11/4 -Continue with IV Decadron, started after he started to develop hypoxia -Patient is extremely frail, with altered mental status, unable to comply with incentive spirometry. -foIlow inflammatory markers closely.  It is trending down which is reassuring.  COVID-19 Labs  Recent Labs    11/18/18 0500 11/18/18 0535 11/19/18 0131 11/20/18 0439  FERRITIN  --  394* 369* 363*  CRP 5.6*  --  3.1* 1.9*    No results found for: 0000000   Acute metabolic encephalopathy on chronic advanced dementia:  - Acute metabolic encephalopathy likely multifactorial-secondary to COVID-19 infection, hypernatremia and AKI. CT head without acute findings.  - Delirium precautions, supportive care. -Patient  remains significantly altered and confused, does not follow commands or answer any questions.  UTI -Patient with positive UA, urine culture growing Proteus mirabilis and Enterococcus faecalis, continue with Unasyn.  Dysphagia -Patient significantly altered, currently on core track tube feed, with episode of gurgling yesterday, tan bstance being suctioned, likely his own secretions secretions,  versus tube feed, he was started on scopolamine that she decrease oral secretions, will hold his tube feeds today to see if there is any significant aspirate on NTS.  Hypernatremia:  -Improving, continue with D5W  Hypokalemia: Improved - Continue IVF supplementation  AKI:  -Getting improving after started IV fluids.  A. fib with RVR:  -Initially required diltiazem drip, and IV metoprolol pushes, now his having core track, heart rate is controlled on Cardizem CD . - Continue ASA 81mg  as he is significant fall risk and possible hx GI bleeding, so not on full dose anticoagulation.    Chronic combined heart failure: Remains compensated, latest EF showed improvement per family report.  - Watch volume status while on IV fluids.  Hold diuretic given hypernatremia and AKI with poor per oral intake.  Dementia: Appears to be advanced  - Resume namenda once oral intake is slightly better.  NPH: Stable for outpatient follow-up with outpatient MDs  History of liver cirrhosis: Compensated - Monitor LFTs while on remdesivir, modestly elevated.  - Continue outpatient follow-up  Failure to thrive: Very weak/debilitated at baseline-now has encephalopathy from Covid and electrolyte abnormalities.  -Started on tube feed , at her phosphorus level closely   palliative care: DNR in place.  Spoke with granddaughter Dr. Josephine Cables who is a hospitalist with the [facility] system.  Goals  of care are for gentle medical treatment-family does not desire aggressive care.  Deep tissue, pressure injury to left heel,  POA: Pressure Injury 11/12/18 Heel Left Deep Tissue Injury - Purple or maroon localized area of discolored intact skin or blood-filled blister due to damage of underlying soft tissue from pressure and/or shear. maroon with yellow blister-type area in center;  (Active)  11/12/18   Location: Heel  Location Orientation: Left  Staging: Deep Tissue Injury - Purple or maroon localized area of discolored intact skin or blood-filled blister due to damage of underlying soft tissue from pressure and/or shear.  Wound Description (Comments): maroon with yellow blister-type area in center; boggy  Present on Admission: Yes    DVT prophylaxis: Lovenox Code Status: DNR Family Communication: Granddaughter by phone updated daily Disposition Plan: Uncertain,   Consultants:   None  Procedures:   None  Antimicrobials:  Remdesivir    Objective: Vitals:   11/20/18 0400 11/20/18 0423 11/20/18 0719 11/20/18 1122  BP: 114/65  106/84 115/75  Pulse: 64   (!) 50  Resp: 17   15  Temp: 97.8 F (36.6 C)  (!) 96.7 F (35.9 C) (!) 96.6 F (35.9 C)  TempSrc: Axillary  Axillary Axillary  SpO2: 99%  95% 92%  Weight:  81 kg    Height:        Intake/Output Summary (Last 24 hours) at 11/20/2018 1459 Last data filed at 11/20/2018 0600 Gross per 24 hour  Intake 4001.79 ml  Output 1300 ml  Net 2701.79 ml   Filed Weights   11/18/18 0400 11/19/18 0411 11/20/18 0423  Weight: 76.4 kg 77.6 kg 81 kg   Sleepy, but open eyes and uncomfortable with NTS, but he is more awake and interactive today, extremely frail, chronically ill-appearing Symmetrical Chest wall movement, Good air movement bilaterally, Minister entry at the bases RRR,No Gallops,Rubs or new Murmurs, No Parasternal Heave +ve B.Sounds, Abd Soft, No tenderness, No rebound - guarding or rigidity. No Cyanosis, Clubbing or edema, No new Rash or bruise     Data Reviewed: I have personally reviewed following labs and imaging studies  CBC: Recent  Labs  Lab 11/14/18 0054 11/15/18 0315 11/16/18 0450 11/17/18 0605 11/18/18 0535 11/19/18 0131 11/20/18 0439  WBC 12.9* 10.4 9.1 9.7 14.8* 12.2* 13.6*  NEUTROABS 11.0* 8.2* 7.1 8.3*  --   --   --   HGB 17.5* 17.3* 17.4* 17.8* 16.0 15.2 16.3  HCT 54.2* 53.5* 55.0* 55.4* 49.9 48.1 50.5  MCV 97.3 97.8 98.7 100.0 99.0 99.4 97.9  PLT 264 243 241 240 256 219 [id number]   Basic Metabolic Panel: Recent Labs  Lab 11/14/18 0230  11/17/18 0605 11/17/18 1550 11/18/18 0535 11/19/18 0131 11/20/18 0439  NA  --    < > 157* 154* 153* 150* 143  K  --    < > 4.3 3.8 4.1 4.3 4.7  CL  --    < > 118* 118* 119* 114* 104  CO2  --    < > 29 28 26 26 30   GLUCOSE  --    < > 154* 174* 224* 125* 142*  BUN  --    < > 40* 41* 46* 40* 28*  CREATININE  --    < > 1.55* 1.46* 1.32* 1.22 0.91  CALCIUM  --    < > 9.0 8.7* 8.7* 8.4* 8.5*  MG 2.8*  --   --  2.8* 2.9* 2.7* 2.4  PHOS  --   --   --  2.8 2.4* 3.1 3.2   < > = values in this interval not displayed.   GFR: Estimated Creatinine Clearance: 58 mL/min (by C-G formula based on SCr of 0.91 mg/dL). Liver Function Tests: Recent Labs  Lab 11/16/18 0450 11/17/18 0605 11/18/18 0535 11/19/18 0131 11/20/18 0439  AST 50* 36 24 23 29   ALT 49* 40 29 24 28   ALKPHOS 81 87 85 83 91  BILITOT 2.1* 2.3* 1.6* 1.2 1.2  PROT 6.2* 6.5 5.5* 5.2* 5.5*  ALBUMIN 3.0* 3.0* 2.6* 2.4* 2.4*   No results for input(s): LIPASE, AMYLASE in the last 168 hours. No results for input(s): AMMONIA in the last 168 hours. Coagulation Profile: No results for input(s): INR, PROTIME in the last 168 hours. Cardiac Enzymes: No results for input(s): CKTOTAL, CKMB, CKMBINDEX, TROPONINI in the last 168 hours. BNP (last 3 results) No results for input(s): PROBNP in the last 8760 hours. HbA1C: Recent Labs    11/17/18 1550  HGBA1C 6.1*   CBG: Recent Labs  Lab 11/19/18 1524 11/19/18 2101 11/20/18 0039 11/20/18 0812 11/20/18 1159  GLUCAP 296* 157* 138* 108* 85   Lipid Profile: No  results for input(s): CHOL, HDL, LDLCALC, TRIG, CHOLHDL, LDLDIRECT in the last 72 hours. Thyroid Function Tests: No results for input(s): TSH, T4TOTAL, FREET4, T3FREE, THYROIDAB in the last 72 hours. Anemia Panel: Recent Labs    11/19/18 0131 11/20/18 0439  FERRITIN 369* 363*   Urine analysis:    Component Value Date/Time   COLORURINE AMBER (A) 11/18/2018 0200   APPEARANCEUR HAZY (A) 11/18/2018 0200   LABSPEC 1.014 11/18/2018 0200   PHURINE 7.0 11/18/2018 0200   GLUCOSEU NEGATIVE 11/18/2018 0200   HGBUR NEGATIVE 11/18/2018 0200   BILIRUBINUR NEGATIVE 11/18/2018 0200   KETONESUR NEGATIVE 11/18/2018 0200   PROTEINUR 100 (A) 11/18/2018 0200   NITRITE NEGATIVE 11/18/2018 0200   LEUKOCYTESUR SMALL (A) 11/18/2018 0200   Recent Results (from the past 240 hour(s))  Blood Culture (routine x 2)     Status: None   Collection Time: 11/12/18 12:30 PM   Specimen: BLOOD  Result Value Ref Range Status   Specimen Description BLOOD BLOOD LEFT FOREARM  Final   Special Requests   Final    BOTTLES DRAWN AEROBIC AND ANAEROBIC Blood Culture results may not be optimal due to an inadequate volume of blood received in culture bottles   Culture   Final    NO GROWTH 5 DAYS Performed at [facility], [address] [place], [place] [address]    Report Status 11/17/2018 FINAL  Final  Blood Culture (routine x 2)     Status: None   Collection Time: 11/12/18 12:52 PM   Specimen: BLOOD  Result Value Ref Range Status   Specimen Description BLOOD BLOOD RIGHT FOREARM  Final   Special Requests   Final    BOTTLES DRAWN AEROBIC AND ANAEROBIC Blood Culture adequate volume   Culture   Final    NO GROWTH 5 DAYS Performed at [facility], [address] [place], [place] [address]    Report Status 11/17/2018 FINAL  Final  MRSA PCR Screening     Status: None   Collection Time: 11/13/18  2:20 AM   Specimen: Nasopharyngeal  Result Value Ref Range Status   MRSA by PCR NEGATIVE NEGATIVE  Final    Comment:        The GeneXpert MRSA Assay (FDA approved for NASAL specimens only), is one component of a comprehensive MRSA colonization surveillance program. It is  not intended to diagnose MRSA infection nor to guide or monitor treatment for MRSA infections. Performed at [facility], [place] [address], [place], [place] [address]   Culture, Urine     Status: Abnormal   Collection Time: 11/18/18  2:00 AM   Specimen: Urine, Random  Result Value Ref Range Status   Specimen Description   Final    URINE, RANDOM Performed at [place] [address] [place], [place] [address]    Special Requests   Final    NONE Performed at [facility], [place] [address] [place], [place] [address]    Culture (A)  Final    >=100,000 COLONIES/mL PROTEUS MIRABILIS >=100,000 COLONIES/mL ENTEROCOCCUS FAECALIS    Report Status 11/20/2018 FINAL  Final   Organism ID, Bacteria PROTEUS MIRABILIS (A)  Final   Organism ID, Bacteria ENTEROCOCCUS FAECALIS (A)  Final      Susceptibility   Enterococcus faecalis - MIC*    AMPICILLIN <=2 SENSITIVE Sensitive     LEVOFLOXACIN 1 SENSITIVE Sensitive     NITROFURANTOIN <=16 SENSITIVE Sensitive     VANCOMYCIN 1 SENSITIVE Sensitive     * >=100,000 COLONIES/mL ENTEROCOCCUS FAECALIS   Proteus mirabilis - MIC*    AMPICILLIN <=2 SENSITIVE Sensitive     CEFAZOLIN <=4 SENSITIVE Sensitive     CEFTRIAXONE <=1 SENSITIVE Sensitive     CIPROFLOXACIN <=0.25 SENSITIVE Sensitive     GENTAMICIN <=1 SENSITIVE Sensitive     IMIPENEM 4 SENSITIVE Sensitive     NITROFURANTOIN 128 RESISTANT Resistant     TRIMETH/SULFA <=20 SENSITIVE Sensitive     AMPICILLIN/SULBACTAM <=2 SENSITIVE Sensitive     PIP/TAZO <=4 SENSITIVE Sensitive     * >=100,000 COLONIES/mL PROTEUS MIRABILIS      Radiology Studies: Dg Abd 1 View  Result Date: 11/20/2018 CLINICAL DATA:  Clogged feeding tube EXAM: ABDOMEN - 1 VIEW COMPARISON:  None.  FINDINGS: The enteric tube tip projects over the stomach. Partially visualized ICD lead overlying the heart. There is a paucity of bowel gas but no dilated loops to suggest obstruction. No unexpected calcification. No supine evidence for free air. No acute finding in the visualized skeleton. IMPRESSION: Enteric tube tip projects over the stomach. Recommend advancement if post pyloric positioning is desired. Electronically Signed   By: Audie Pinto M.D.   On: 11/20/2018 10:24   Dg Chest Port 1 View  Result Date: 11/19/2018 CLINICAL DATA:  Patient with hypoxia. EXAM: PORTABLE CHEST 1 VIEW COMPARISON:  Chest radiograph 11/17/2018 FINDINGS: Monitoring leads overlie the patient. Enteric tube courses inferior to the diaphragm. Multi lead AICD device overlies the left hemithorax, leads are stable in position. Stable cardiac and mediastinal contours. Low lung volumes. Similar-appearing left mid lower lung consolidation. Right mid lung consolidation. Thoracic spine degenerative changes. IMPRESSION: Enteric tube courses inferior to the diaphragm. Left mid lower lung and right mid lung consolidation, similar to prior. Electronically Signed   By: Lovey Newcomer M.D.   On: 11/19/2018 18:57    Scheduled Meds: . acidophilus  1 capsule Oral BID  . aspirin  81 mg Per Tube Daily  . dexamethasone (DECADRON) injection  6 mg Intravenous Daily  . diltiazem  30 mg Per Tube Q6H  . dutasteride  0.5 mg Oral Daily  . enoxaparin (LOVENOX) injection  40 mg Subcutaneous Q24H  . feeding supplement (ENSURE ENLIVE)  237 mL Oral BID BM  . feeding supplement (PRO-STAT SUGAR FREE 64)  30 mL Per Tube Daily  .  free water  350 mL Per Tube Q3H  . insulin aspart  0-9 Units Subcutaneous TID WC  . memantine  10 mg Oral BID  . metoprolol tartrate  2.5 mg Intravenous Q8H  . mirtazapine  15 mg Oral QHS  . multivitamin  15 mL Per Tube Daily  . nystatin  1 g Topical BID  . scopolamine  1 patch Transdermal Q72H  . sennosides  10 mL Oral  BID   Continuous Infusions: . ampicillin-sulbactam (UNASYN) IV 3 g (11/20/18 1116)  . dextrose 50 mL/hr at 11/20/18 0825  . feeding supplement (OSMOLITE 1.2 CAL) Stopped (11/20/18 0500)     LOS: 8 days    Phillips Climes, MD [facility] [email or web address] 11/20/2018, 2:59 PM

## 2018-11-20 NOTE — Progress Notes (Signed)
This nurse suctioned the patient and got tan colored secretions out that are the same color and consistency of patient's tube feed. This nurse stopped the tube feed and contacted covering service, R.Shanon Brow, about findings. R.David MD put in orders for a chest X-ray. Will continue to monitor.

## 2018-11-21 LAB — COMPREHENSIVE METABOLIC PANEL
ALT: 23 U/L (ref 0–44)
AST: 22 U/L (ref 15–41)
Albumin: 2.4 g/dL — ABNORMAL LOW (ref 3.5–5.0)
Alkaline Phosphatase: 88 U/L (ref 38–126)
Anion gap: 10 (ref 5–15)
BUN: 27 mg/dL — ABNORMAL HIGH (ref 8–23)
CO2: 26 mmol/L (ref 22–32)
Calcium: 8.6 mg/dL — ABNORMAL LOW (ref 8.9–10.3)
Chloride: 104 mmol/L (ref 98–111)
Creatinine, Ser: 0.96 mg/dL (ref 0.61–1.24)
GFR calc Af Amer: >60 mL/min (ref >60)
GFR calc non Af Amer: >60 mL/min (ref >60)
Glucose, Bld: 99 mg/dL (ref 70–99)
Potassium: 5 mmol/L (ref 3.5–5.1)
Sodium: 140 mmol/L (ref 135–145)
Total Bilirubin: 1 mg/dL (ref 0.3–1.2)
Total Protein: 5.7 g/dL — ABNORMAL LOW (ref 6.5–8.1)

## 2018-11-21 LAB — CBC
HCT: 49 % (ref 39.0–52.0)
Hemoglobin: 15.9 g/dL (ref 13.0–17.0)
MCH: 31.5 pg (ref 26.0–34.0)
MCHC: 32.4 g/dL (ref 30.0–36.0)
MCV: 97 fL (ref 80.0–100.0)
Platelets: 212 10*3/uL (ref 150–400)
RBC: 5.05 MIL/uL (ref 4.22–5.81)
RDW: 13.2 % (ref 11.5–15.5)
WBC: 17.7 10*3/uL — ABNORMAL HIGH (ref 4.0–10.5)
nRBC: 0 % (ref 0.0–0.2)

## 2018-11-21 LAB — FERRITIN: Ferritin: 393 ng/mL — ABNORMAL HIGH (ref 24–336)

## 2018-11-21 LAB — GLUCOSE, CAPILLARY
Glucose-Capillary: 79 mg/dL (ref 70–99)
Glucose-Capillary: 93 mg/dL (ref 70–99)
Glucose-Capillary: 122 mg/dL — ABNORMAL HIGH (ref 70–99)

## 2018-11-21 LAB — MAGNESIUM: Magnesium: 2.5 mg/dL — ABNORMAL HIGH (ref 1.7–2.4)

## 2018-11-21 LAB — PHOSPHORUS: Phosphorus: 3.4 mg/dL (ref 2.5–4.6)

## 2018-11-21 LAB — C-REACTIVE PROTEIN: CRP: 10.8 mg/dL — ABNORMAL HIGH (ref <1.0)

## 2018-11-21 MED ORDER — SODIUM CHLORIDE 0.9 % IV BOLUS
250.0000 mL | Freq: Once | INTRAVENOUS | Status: AC
  Administered 2018-11-21: 250 mL via INTRAVENOUS

## 2018-11-21 MED ORDER — OSMOLITE 1.2 CAL PO LIQD
1000.0000 mL | ORAL | Status: DC
  Administered 2018-11-21 – 2018-11-22 (×2): 1000 mL
  Filled 2018-11-21 (×2): qty 1000

## 2018-11-21 MED ORDER — FREE WATER
200.0000 mL | Freq: Four times a day (QID) | Status: DC
  Administered 2018-11-21 – 2018-11-23 (×10): 200 mL

## 2018-11-21 NOTE — Progress Notes (Signed)
Facetimed with pt wife and son. Later facetimed with grandaughter.

## 2018-11-21 NOTE — Progress Notes (Signed)
PROGRESS NOTE  Stephen Roth.  [id number] DOB: 08/07/1925 DOA: 11/12/2018 PCP: Venia Carbon, MD   Brief Narrative: Patient is a 85 y.o. male with PMHx of dementia, chronic systolic heart failure-s/p AICD, cirrhosis, NPH-resident of a skilled nursing facility-presented to [facility] ED on 10/30 with worsening confusion, found to have COVID-19 viral pneumonia, hyponatremia and A. fib with RVR.  See below for further details.  Per ED note at [facility]-patient was diagnosed with COVID-19 at [facility] skilled nursing facility in [place] approximately 12 days prior to admission.  Subjective: Discussed with staff, no significant events overnight, he remains to have some upper airway gurgling with some minimal secretion despite holding his tube feeds.   Assessment & Plan: Principal Problem:   Acute encephalopathy Active Problems:   Hypernatremia   AKI (acute kidney injury) ([place])   Dementia without behavioral disturbance ([place])   COVID-19 virus infection   Atrial fibrillation with RVR ([facility])   Pressure ulcer of left heel  Covid-19 pneumonia:  -Is requiring oxygen intermittently , remains on 2 L nasal cannula today. - Received  remdesivir 10/31 - 11/4 -Continue with IV Decadron, started after he started to develop hypoxia -Patient is extremely frail, with altered mental status, unable to comply with incentive spirometry.  COVID-19 Labs  Recent Labs    11/19/18 0131 11/20/18 0439 11/21/18 0457  FERRITIN 369* 363* 393*  CRP 3.1* 1.9* 10.8*    No results found for: 0000000   Acute metabolic encephalopathy on chronic advanced dementia:  - Acute metabolic encephalopathy likely multifactorial-secondary to COVID-19 infection, hypernatremia and AKI. CT head without acute findings.  - Delirium precautions, supportive care. -Patient remains significantly altered and confused, does not follow commands or answer any questions.  UTI -Patient with positive UA, urine culture  growing Proteus mirabilis and Enterococcus faecalis, continue with Unasyn.  Dysphagia -Patient with significant upper airway respiratory sounds, likely unable to clear his own secretions, and likely tube feed as it has been held for 24 hours and still having recurrent relation of secretions in the back of his throat, continue with NTS, started on a scopolamine patch, will start on as needed glycopyrrolate . -We will resume tube feeds at a lower rate 20 cc/h .  Hypernatremia:  -Improving, continue with D5W  Hypokalemia: Improved - Continue IVF supplementation  AKI:  -Getting improving after started IV fluids.  A. fib with RVR:  -Initially required diltiazem drip, and IV metoprolol pushes, now his having core track, heart rate is controlled on Cardizem CD . - Continue ASA 81mg  as he is significant fall risk and possible hx GI bleeding, so not on full dose anticoagulation.    Chronic combined heart failure: Remains compensated, latest EF showed improvement per family report.  - Watch volume status while on IV fluids.  Hold diuretic given hypernatremia and AKI with poor per oral intake.  Dementia: Appears to be advanced  - Resume namenda once oral intake is slightly better.  NPH: Stable for outpatient follow-up with outpatient MDs  History of liver cirrhosis: Compensated - Monitor LFTs while on remdesivir, modestly elevated.  - Continue outpatient follow-up  Failure to thrive: Very weak/debilitated at baseline-now has encephalopathy from Covid and electrolyte abnormalities.  -Started on tube feed , at her phosphorus level closely   palliative care: DNR in place.  Spoke with granddaughter Dr. Josephine Cables who is a hospitalist with the [facility] system.  Goals of care are for gentle medical treatment-family does not desire aggressive care.  Despite current  measures including tube feeds, flat corrections, patient remains extremely air, with anticipated very all poor prognosis, have  been discussing his care with granddaughter Dr. Mancel Bale on a daily basis, she is being updated.  Deep tissue, pressure injury to left heel, POA: Pressure Injury 11/12/18 Heel Left Deep Tissue Injury - Purple or maroon localized area of discolored intact skin or blood-filled blister due to damage of underlying soft tissue from pressure and/or shear. maroon with yellow blister-type area in center;  (Active)  11/12/18   Location: Heel  Location Orientation: Left  Staging: Deep Tissue Injury - Purple or maroon localized area of discolored intact skin or blood-filled blister due to damage of underlying soft tissue from pressure and/or shear.  Wound Description (Comments): maroon with yellow blister-type area in center; boggy  Present on Admission: Yes    DVT prophylaxis: Lovenox Code Status: DNR Family Communication: Granddaughter by phone updated daily Disposition Plan: Uncertain,   Consultants:   None  Procedures:   None  Antimicrobials:  Remdesivir    Objective: Vitals:   11/21/18 0000 11/21/18 0400 11/21/18 0730 11/21/18 1115  BP: 98/76 97/67 (!) 121/48 105/88  Pulse: 71     Resp: (!) 29 20  20   Temp: (!) 97.4 F (36.3 C) 97.6 F (36.4 C) 97.6 F (36.4 C) (!) 96.6 F (35.9 C)  TempSrc: Oral Oral Axillary Axillary  SpO2: 98%  100% 96%  Weight:      Height:        Intake/Output Summary (Last 24 hours) at 11/21/2018 1346 Last data filed at 11/21/2018 0400 Gross per 24 hour  Intake -  Output 550 ml  Net -550 ml   Filed Weights   11/18/18 0400 11/19/18 0411 11/20/18 0423  Weight: 76.4 kg 77.6 kg 81 kg   Sleepy, but open eyes spontaneously today, to be more interactive, but does not follow any commands or answer any questions appropriately, extremely frail, chronically ill-appearing . Symmetrical Chest wall movement, diminished air entry at the bases, but clear to auscultation other than upper airway gurgling RRR,No Gallops,Rubs or new Murmurs, No Parasternal Heave  +ve B.Sounds, Abd Soft, No tenderness, No rebound - guarding or rigidity. No Cyanosis, Clubbing or edema, No new Rash or bruise     Data Reviewed: I have personally reviewed following labs and imaging studies  CBC: Recent Labs  Lab 11/15/18 0315 11/16/18 0450 11/17/18 0605 11/18/18 0535 11/19/18 0131 11/20/18 0439 11/21/18 0457  WBC 10.4 9.1 9.7 14.8* 12.2* 13.6* 17.7*  NEUTROABS 8.2* 7.1 8.3*  --   --   --   --   HGB 17.3* 17.4* 17.8* 16.0 15.2 16.3 15.9  HCT 53.5* 55.0* 55.4* 49.9 48.1 50.5 49.0  MCV 97.8 98.7 100.0 99.0 99.4 97.9 97.0  PLT 243 241 240 256 219 211 [id number]   Basic Metabolic Panel: Recent Labs  Lab 11/17/18 1550 11/18/18 0535 11/19/18 0131 11/20/18 0439 11/21/18 0457  NA 154* 153* 150* 143 140  K 3.8 4.1 4.3 4.7 5.0  CL 118* 119* 114* 104 104  CO2 28 26 26 30 26   GLUCOSE 174* 224* 125* 142* 99  BUN 41* 46* 40* 28* 27*  CREATININE 1.46* 1.32* 1.22 0.91 0.96  CALCIUM 8.7* 8.7* 8.4* 8.5* 8.6*  MG 2.8* 2.9* 2.7* 2.4 2.5*  PHOS 2.8 2.4* 3.1 3.2 3.4   GFR: Estimated Creatinine Clearance: 55 mL/min (by C-G formula based on SCr of 0.96 mg/dL). Liver Function Tests: Recent Labs  Lab 11/17/18 [id number] 11/18/18 0535 11/19/18 0131 11/20/18  [id number] 11/21/18 0457  AST 36 24 23 29 22   ALT 40 29 24 28 23   ALKPHOS 87 85 83 91 88  BILITOT 2.3* 1.6* 1.2 1.2 1.0  PROT 6.5 5.5* 5.2* 5.5* 5.7*  ALBUMIN 3.0* 2.6* 2.4* 2.4* 2.4*   No results for input(s): LIPASE, AMYLASE in the last 168 hours. No results for input(s): AMMONIA in the last 168 hours. Coagulation Profile: No results for input(s): INR, PROTIME in the last 168 hours. Cardiac Enzymes: No results for input(s): CKTOTAL, CKMB, CKMBINDEX, TROPONINI in the last 168 hours. BNP (last 3 results) No results for input(s): PROBNP in the last 8760 hours. HbA1C: No results for input(s): HGBA1C in the last 72 hours. CBG: Recent Labs  Lab 11/20/18 1159 11/20/18 1631 11/20/18 2032 11/21/18 0749 11/21/18 1209   GLUCAP 85 126* 129* 79 93   Lipid Profile: No results for input(s): CHOL, HDL, LDLCALC, TRIG, CHOLHDL, LDLDIRECT in the last 72 hours. Thyroid Function Tests: No results for input(s): TSH, T4TOTAL, FREET4, T3FREE, THYROIDAB in the last 72 hours. Anemia Panel: Recent Labs    11/20/18 0439 11/21/18 0457  FERRITIN 363* 393*   Urine analysis:    Component Value Date/Time   COLORURINE AMBER (A) 11/18/2018 0200   APPEARANCEUR HAZY (A) 11/18/2018 0200   LABSPEC 1.014 11/18/2018 0200   PHURINE 7.0 11/18/2018 0200   GLUCOSEU NEGATIVE 11/18/2018 0200   HGBUR NEGATIVE 11/18/2018 0200   BILIRUBINUR NEGATIVE 11/18/2018 0200   KETONESUR NEGATIVE 11/18/2018 0200   PROTEINUR 100 (A) 11/18/2018 0200   NITRITE NEGATIVE 11/18/2018 0200   LEUKOCYTESUR SMALL (A) 11/18/2018 0200   Recent Results (from the past 240 hour(s))  Blood Culture (routine x 2)     Status: None   Collection Time: 11/12/18 12:30 PM   Specimen: BLOOD  Result Value Ref Range Status   Specimen Description BLOOD BLOOD LEFT FOREARM  Final   Special Requests   Final    BOTTLES DRAWN AEROBIC AND ANAEROBIC Blood Culture results may not be optimal due to an inadequate volume of blood received in culture bottles   Culture   Final    NO GROWTH 5 DAYS Performed at [facility], [address] [place], [place] [address]    Report Status 11/17/2018 FINAL  Final  Blood Culture (routine x 2)     Status: None   Collection Time: 11/12/18 12:52 PM   Specimen: BLOOD  Result Value Ref Range Status   Specimen Description BLOOD BLOOD RIGHT FOREARM  Final   Special Requests   Final    BOTTLES DRAWN AEROBIC AND ANAEROBIC Blood Culture adequate volume   Culture   Final    NO GROWTH 5 DAYS Performed at [facility], [address] [place], [place] [address]    Report Status 11/17/2018 FINAL  Final  MRSA PCR Screening     Status: None   Collection Time: 11/13/18  2:20 AM   Specimen: Nasopharyngeal  Result Value Ref  Range Status   MRSA by PCR NEGATIVE NEGATIVE Final    Comment:        The GeneXpert MRSA Assay (FDA approved for NASAL specimens only), is one component of a comprehensive MRSA colonization surveillance program. It is not intended to diagnose MRSA infection nor to guide or monitor treatment for MRSA infections. Performed at [facility] [place] [address] [place], [place] [address]   Culture, Urine     Status: Abnormal   Collection Time: 11/18/18  2:00 AM  Specimen: Urine, Random  Result Value Ref Range Status   Specimen Description   Final    URINE, RANDOM Performed at [place] [address], [place], [place] [address]    Special Requests   Final    NONE Performed at [facility], [place] [address] [place], [place] [address]    Culture (A)  Final    >=100,000 COLONIES/mL PROTEUS MIRABILIS >=100,000 COLONIES/mL ENTEROCOCCUS FAECALIS    Report Status 11/20/2018 FINAL  Final   Organism ID, Bacteria PROTEUS MIRABILIS (A)  Final   Organism ID, Bacteria ENTEROCOCCUS FAECALIS (A)  Final      Susceptibility   Enterococcus faecalis - MIC*    AMPICILLIN <=2 SENSITIVE Sensitive     LEVOFLOXACIN 1 SENSITIVE Sensitive     NITROFURANTOIN <=16 SENSITIVE Sensitive     VANCOMYCIN 1 SENSITIVE Sensitive     * >=100,000 COLONIES/mL ENTEROCOCCUS FAECALIS   Proteus mirabilis - MIC*    AMPICILLIN <=2 SENSITIVE Sensitive     CEFAZOLIN <=4 SENSITIVE Sensitive     CEFTRIAXONE <=1 SENSITIVE Sensitive     CIPROFLOXACIN <=0.25 SENSITIVE Sensitive     GENTAMICIN <=1 SENSITIVE Sensitive     IMIPENEM 4 SENSITIVE Sensitive     NITROFURANTOIN 128 RESISTANT Resistant     TRIMETH/SULFA <=20 SENSITIVE Sensitive     AMPICILLIN/SULBACTAM <=2 SENSITIVE Sensitive     PIP/TAZO <=4 SENSITIVE Sensitive     * >=100,000 COLONIES/mL PROTEUS MIRABILIS      Radiology Studies: Dg Abd 1 View  Result Date: 11/20/2018 CLINICAL DATA:  Clogged feeding  tube EXAM: ABDOMEN - 1 VIEW COMPARISON:  None. FINDINGS: The enteric tube tip projects over the stomach. Partially visualized ICD lead overlying the heart. There is a paucity of bowel gas but no dilated loops to suggest obstruction. No unexpected calcification. No supine evidence for free air. No acute finding in the visualized skeleton. IMPRESSION: Enteric tube tip projects over the stomach. Recommend advancement if post pyloric positioning is desired. Electronically Signed   By: Audie Pinto M.D.   On: 11/20/2018 10:24   Dg Chest Port 1 View  Result Date: 11/19/2018 CLINICAL DATA:  Patient with hypoxia. EXAM: PORTABLE CHEST 1 VIEW COMPARISON:  Chest radiograph 11/17/2018 FINDINGS: Monitoring leads overlie the patient. Enteric tube courses inferior to the diaphragm. Multi lead AICD device overlies the left hemithorax, leads are stable in position. Stable cardiac and mediastinal contours. Low lung volumes. Similar-appearing left mid lower lung consolidation. Right mid lung consolidation. Thoracic spine degenerative changes. IMPRESSION: Enteric tube courses inferior to the diaphragm. Left mid lower lung and right mid lung consolidation, similar to prior. Electronically Signed   By: Lovey Newcomer M.D.   On: 11/19/2018 18:57    Scheduled Meds: . acidophilus  1 capsule Oral BID  . aspirin  81 mg Per Tube Daily  . dexamethasone (DECADRON) injection  6 mg Intravenous Daily  . diltiazem  30 mg Per Tube Q6H  . dutasteride  0.5 mg Oral Daily  . enoxaparin (LOVENOX) injection  40 mg Subcutaneous Q24H  . feeding supplement (ENSURE ENLIVE)  237 mL Oral BID BM  . feeding supplement (PRO-STAT SUGAR FREE 64)  30 mL Per Tube Daily  . free water  350 mL Per Tube Q3H  . insulin aspart  0-9 Units Subcutaneous TID WC  . memantine  10 mg Oral BID  . metoprolol tartrate  2.5 mg Intravenous Q8H  . mirtazapine  15 mg Oral QHS  . multivitamin  15 mL Per Tube Daily  . nystatin  1 g Topical BID  . scopolamine  1  patch Transdermal Q72H   Continuous Infusions: . ampicillin-sulbactam (UNASYN) IV 3 g (11/21/18 1112)  . dextrose 100 mL/hr at 11/20/18 1835  . feeding supplement (OSMOLITE 1.2 CAL) Stopped (11/20/18 0500)     LOS: 9 days    Phillips Climes, MD [facility] [email or web address] 11/21/2018, 1:46 PM

## 2018-11-21 NOTE — Progress Notes (Signed)
Facetimed with Daughter Stephen Roth

## 2018-11-21 NOTE — Progress Notes (Signed)
Pt BP 82/60, checked x 2. Pt currently sleeping, NAD. On call provider notified, awaiting callback.

## 2018-11-22 LAB — COMPREHENSIVE METABOLIC PANEL
ALT: 28 U/L (ref 0–44)
AST: 30 U/L (ref 15–41)
Albumin: 2.1 g/dL — ABNORMAL LOW (ref 3.5–5.0)
Alkaline Phosphatase: 74 U/L (ref 38–126)
Anion gap: 7 (ref 5–15)
BUN: 26 mg/dL — ABNORMAL HIGH (ref 8–23)
CO2: 29 mmol/L (ref 22–32)
Calcium: 8.5 mg/dL — ABNORMAL LOW (ref 8.9–10.3)
Chloride: 104 mmol/L (ref 98–111)
Creatinine, Ser: 0.88 mg/dL (ref 0.61–1.24)
GFR calc Af Amer: >60 mL/min (ref >60)
GFR calc non Af Amer: >60 mL/min (ref >60)
Glucose, Bld: 140 mg/dL — ABNORMAL HIGH (ref 70–99)
Potassium: 4.8 mmol/L (ref 3.5–5.1)
Sodium: 140 mmol/L (ref 135–145)
Total Bilirubin: 0.9 mg/dL (ref 0.3–1.2)
Total Protein: 5.3 g/dL — ABNORMAL LOW (ref 6.5–8.1)

## 2018-11-22 LAB — FERRITIN: Ferritin: 329 ng/mL (ref 24–336)

## 2018-11-22 LAB — GLUCOSE, CAPILLARY
Glucose-Capillary: 147 mg/dL — ABNORMAL HIGH (ref 70–99)
Glucose-Capillary: 154 mg/dL — ABNORMAL HIGH (ref 70–99)
Glucose-Capillary: 140 mg/dL — ABNORMAL HIGH (ref 70–99)
Glucose-Capillary: 156 mg/dL — ABNORMAL HIGH (ref 70–99)
Glucose-Capillary: 138 mg/dL — ABNORMAL HIGH (ref 70–99)
Glucose-Capillary: 126 mg/dL — ABNORMAL HIGH (ref 70–99)

## 2018-11-22 LAB — CBC
HCT: 44.6 % (ref 39.0–52.0)
Hemoglobin: 14.4 g/dL (ref 13.0–17.0)
MCH: 31.2 pg (ref 26.0–34.0)
MCHC: 32.3 g/dL (ref 30.0–36.0)
MCV: 96.7 fL (ref 80.0–100.0)
Platelets: 195 10*3/uL (ref 150–400)
RBC: 4.61 MIL/uL (ref 4.22–5.81)
RDW: 13.1 % (ref 11.5–15.5)
WBC: 15.4 10*3/uL — ABNORMAL HIGH (ref 4.0–10.5)
nRBC: 0 % (ref 0.0–0.2)

## 2018-11-22 LAB — MAGNESIUM: Magnesium: 2.5 mg/dL — ABNORMAL HIGH (ref 1.7–2.4)

## 2018-11-22 LAB — PHOSPHORUS: Phosphorus: 3.2 mg/dL (ref 2.5–4.6)

## 2018-11-22 LAB — C-REACTIVE PROTEIN: CRP: 10.4 mg/dL — ABNORMAL HIGH (ref <1.0)

## 2018-11-22 MED ORDER — GLYCOPYRROLATE 0.2 MG/ML IJ SOLN
0.1000 mg | Freq: Once | INTRAMUSCULAR | Status: AC
  Administered 2018-11-22: 0.1 mg via INTRAVENOUS
  Filled 2018-11-22: qty 1

## 2018-11-22 NOTE — Progress Notes (Signed)
Pharmacy Antibiotic Note  Stephen Roth. is a 85 y.o. male admitted on 11/12/2018 with COVID-19 pneumonia.  MD wanting to treat positive urine culture (Proteus and Enterococcus) as well as possible aspiration. Pharmacy has been consulted for Unasyn dosing. MD aware of noted allergy to cephalosporin, will monitor closely for reaction.  Plan: Continue Unasyn 3g IV q8h Monitor for any drug reaction Follow up renal function and adjust as needed F/U planned length of therapy  Height: 6' (182.9 cm) Weight: 178 lb 9.2 oz (81 kg) IBW/kg (Calculated) : 77.6  Temp (24hrs), Avg:97.4 F (36.3 C), Min:96.8 F (36 C), Max:98 F (36.7 C)  Recent Labs  Lab 11/17/18 1550 11/18/18 0535 11/19/18 0131 11/20/18 0439 11/21/18 0457 11/22/18 0840  WBC  --  14.8* 12.2* 13.6* 17.7* 15.4*  CREATININE 1.46* 1.32* 1.22 0.91 0.96 0.88  LATICACIDVEN 2.1*  --   --   --   --   --     Estimated Creatinine Clearance: 60 mL/min (by C-G formula based on SCr of 0.88 mg/dL).    Allergies  Allergen Reactions  . Cephalexin Other (See Comments)    Confusion, hives, hallucinatons  . Clindamycin/Lincomycin Other (See Comments)    Confusion, hives, hallucinations  . Morphine And Related Other (See Comments)    Antimicrobials this admission: 10/31 Remdesivir >> 11/4 11/5 Levaquin x 1 dose 11/6 Unasyn >>  Dose adjustments this admission:  Microbiology results: 10/31 MRSA PCR: negative 11/5 UCx: >100k Proteus mirabilis, >100k Enterococcus  Thank you for allowing pharmacy to be a part of this patient's care.  Ulice Dash, PharmD, BCPS Clinical Pharmacist  11/22/2018 1:39 PM

## 2018-11-22 NOTE — Progress Notes (Signed)
SLP Cancellation Note  Patient Details Name: Stephen Roth. MRN: [id number] DOB: Oct 02, 1925   Cancelled treatment:       Reason Eval/Treat Not Completed: Medical issues which prohibited therapy. Per chart, pt has been having difficulty with secretion management.  He has also not been sufficiently alert when SLP has tried to see him. He has a cortrak; does not seem to be appropriate for PO intake if he is not managing secretions. Will continue to follow as able to see if his alertness and mentation show signs of improvement to try more POs.    Venita Sheffield Jeronda Don 11/22/2018, 3:55 PM  Pollyann Glen, M.A. [place] Acute Environmental education officer [phone] Office [phone]

## 2018-11-22 NOTE — Progress Notes (Signed)
PROGRESS NOTE  Stephen Roth.  [id number] DOB: [birth year] DOA: 11/12/2018 PCP: Venia Carbon, MD   Brief Narrative: Patient is a 85 y.o. male with PMHx of dementia, chronic systolic heart failure-s/p AICD, cirrhosis, NPH-resident of a skilled nursing facility-presented to [facility] ED on 10/30 with worsening confusion, found to have COVID-19 viral pneumonia, hyponatremia and A. fib with RVR.  See below for further details.  Per ED note at [facility]-patient was diagnosed with COVID-19 at [facility] skilled nursing facility in [place] approximately 12 days prior to admission.  Subjective:  No significant events overnight as discussed with staff   Assessment & Plan: Principal Problem:   Acute encephalopathy Active Problems:   Hypernatremia   AKI (acute kidney injury) ([place])   Dementia without behavioral disturbance ([place])   COVID-19 virus infection   Atrial fibrillation with RVR (HCC)   Pressure ulcer of left heel  Covid-19 pneumonia:  -Is requiring oxygen intermittently , remains on 2 L nasal cannula today. - Received  remdesivir 10/31 - 11/4 -Continue with IV Decadron, started after he started to develop hypoxia -Patient is extremely frail, with altered mental status, unable to comply with incentive spirometry.  COVID-19 Labs  Recent Labs    11/20/18 0439 11/21/18 0457 11/22/18 0840  FERRITIN 363* 393* 329  CRP 1.9* 10.8* 10.4*    No results found for: 0000000   Acute metabolic encephalopathy on chronic advanced dementia:  - Acute metabolic encephalopathy likely multifactorial-secondary to COVID-19 infection, hypernatremia and AKI. CT head without acute findings.  -Patient remains significantly confused, lethargic, even though more awake but does not follow command and will not be able to swallow any food safely.  UTI -Patient with positive UA, urine culture growing Proteus mirabilis and Enterococcus faecalis, continue with Unasyn.  Dysphagia -Patient  appears to be having significant upper airway respiratory sounds, very likely that he is unable to clear his own secretions, this has slightly improved on scopolamine patch, receiving as needed glycopyrrolate . - requiring frequent NTS -Used to be secondary to his own secretions, and and tube feed, he was resumed on his tube feed yesterday, will increase to 30 cc/h today  Hypernatremia:  -Improving, continue with D5W  Hypokalemia: Improved - Continue IVF supplementation  AKI:  -Getting improving after started IV fluids.  A. fib with RVR:  -Initially required diltiazem drip, and IV metoprolol pushes, now his having core track, heart rate is controlled on Cardizem CD . - Continue ASA 81mg  as he is significant fall risk and possible hx GI bleeding, so not on full dose anticoagulation.    Chronic combined heart failure: Remains compensated, latest EF showed improvement per family report.  - Watch volume status while on IV fluids.  Hold diuretic given hypernatremia and AKI with poor per oral intake.  Dementia: Appears to be advanced  - Resume namenda once oral intake is slightly better.  NPH: Stable for outpatient follow-up with outpatient MDs  History of liver cirrhosis: Compensated - Monitor LFTs while on remdesivir, modestly elevated.  - Continue outpatient follow-up  Failure to thrive: Very weak/debilitated at baseline-now has encephalopathy from Covid and electrolyte abnormalities.  -Started on tube feed , at her phosphorus level closely   palliative care: DNR in place.  Spoke with granddaughter Dr. Josephine Cables who is a hospitalist with the [facility] system.  Goals of care are for gentle medical treatment-family does not desire aggressive care.  Despite current measures including tube feeds, flat corrections, patient remains extremely air, with anticipated very  all poor prognosis, have been discussing his care with granddaughter Dr. Mancel Bale on a daily basis, she is being  updated.  Deep tissue, pressure injury to left heel, POA: Pressure Injury 11/12/18 Heel Left Deep Tissue Injury - Purple or maroon localized area of discolored intact skin or blood-filled blister due to damage of underlying soft tissue from pressure and/or shear. maroon with yellow blister-type area in center;  (Active)  11/12/18   Location: Heel  Location Orientation: Left  Staging: Deep Tissue Injury - Purple or maroon localized area of discolored intact skin or blood-filled blister due to damage of underlying soft tissue from pressure and/or shear.  Wound Description (Comments): maroon with yellow blister-type area in center; boggy  Present on Admission: Yes    DVT prophylaxis: Lovenox Code Status: DNR Family Communication: Granddaughter by phone updated daily Disposition Plan: Uncertain,   Consultants:   None  Procedures:   None  Antimicrobials:  Remdesivir    Objective: Vitals:   11/22/18 0200 11/22/18 0300 11/22/18 0400 11/22/18 0725  BP:   103/66 116/66  Pulse: 68 (!) 40 (!) 58 62  Resp: (!) 25 19 16 15   Temp:   (!) 97.3 F (36.3 C) 98 F (36.7 C)  TempSrc:   Axillary Oral  SpO2: 99% 100% 100% 95%  Weight:      Height:        Intake/Output Summary (Last 24 hours) at 11/22/2018 1539 Last data filed at 11/22/2018 1343 Gross per 24 hour  Intake 10280.77 ml  Output 700 ml  Net 9580.77 ml   Filed Weights   11/18/18 0400 11/19/18 0411 11/20/18 0423  Weight: 76.4 kg 77.6 kg 81 kg   Patient appears to be more awake today, but minimally communicative, does not follow commands or answer any questions appropriately, extremely frail, chronically ill-appearing Symmetrical Chest wall movement, Good air movement bilaterally, CTAB Irregularly irregular,No Gallops,Rubs or new Murmurs, No Parasternal Heave +ve B.Sounds, Abd Soft, No tenderness, No rebound - guarding or rigidity. No Cyanosis, Clubbing or edema, No new Rash or bruise     Data Reviewed: I have  personally reviewed following labs and imaging studies  CBC: Recent Labs  Lab 11/16/18 0450 11/17/18 0605 11/18/18 0535 11/19/18 0131 11/20/18 0439 11/21/18 0457 11/22/18 0840  WBC 9.1 9.7 14.8* 12.2* 13.6* 17.7* 15.4*  NEUTROABS 7.1 8.3*  --   --   --   --   --   HGB 17.4* 17.8* 16.0 15.2 16.3 15.9 14.4  HCT 55.0* 55.4* 49.9 48.1 50.5 49.0 44.6  MCV 98.7 100.0 99.0 99.4 97.9 97.0 96.7  PLT 241 240 256 219 211 212 0000000   Basic Metabolic Panel: Recent Labs  Lab 11/18/18 0535 11/19/18 0131 11/20/18 0439 11/21/18 0457 11/22/18 0840  NA 153* 150* 143 140 140  K 4.1 4.3 4.7 5.0 4.8  CL 119* 114* 104 104 104  CO2 26 26 30 26 29   GLUCOSE 224* 125* 142* 99 140*  BUN 46* 40* 28* 27* 26*  CREATININE 1.32* 1.22 0.91 0.96 0.88  CALCIUM 8.7* 8.4* 8.5* 8.6* 8.5*  MG 2.9* 2.7* 2.4 2.5* 2.5*  PHOS 2.4* 3.1 3.2 3.4 3.2   GFR: Estimated Creatinine Clearance: 60 mL/min (by C-G formula based on SCr of 0.88 mg/dL). Liver Function Tests: Recent Labs  Lab 11/18/18 0535 11/19/18 0131 11/20/18 0439 11/21/18 0457 11/22/18 0840  AST 24 23 29 22 30   ALT 29 24 28 23 28   ALKPHOS 85 83 91 88 74  BILITOT 1.6* 1.2  1.2 1.0 0.9  PROT 5.5* 5.2* 5.5* 5.7* 5.3*  ALBUMIN 2.6* 2.4* 2.4* 2.4* 2.1*   No results for input(s): LIPASE, AMYLASE in the last 168 hours. No results for input(s): AMMONIA in the last 168 hours. Coagulation Profile: No results for input(s): INR, PROTIME in the last 168 hours. Cardiac Enzymes: No results for input(s): CKTOTAL, CKMB, CKMBINDEX, TROPONINI in the last 168 hours. BNP (last 3 results) No results for input(s): PROBNP in the last 8760 hours. HbA1C: No results for input(s): HGBA1C in the last 72 hours. CBG: Recent Labs  Lab 11/21/18 1209 11/21/18 1611 11/21/18 2140 11/22/18 0837 11/22/18 1155  GLUCAP 93 122* 154* 140* 156*   Lipid Profile: No results for input(s): CHOL, HDL, LDLCALC, TRIG, CHOLHDL, LDLDIRECT in the last 72 hours. Thyroid Function  Tests: No results for input(s): TSH, T4TOTAL, FREET4, T3FREE, THYROIDAB in the last 72 hours. Anemia Panel: Recent Labs    11/21/18 0457 11/22/18 0840  FERRITIN 393* 329   Urine analysis:    Component Value Date/Time   COLORURINE AMBER (A) 11/18/2018 0200   APPEARANCEUR HAZY (A) 11/18/2018 0200   LABSPEC 1.014 11/18/2018 0200   PHURINE 7.0 11/18/2018 0200   GLUCOSEU NEGATIVE 11/18/2018 0200   HGBUR NEGATIVE 11/18/2018 0200   BILIRUBINUR NEGATIVE 11/18/2018 0200   KETONESUR NEGATIVE 11/18/2018 0200   PROTEINUR 100 (A) 11/18/2018 0200   NITRITE NEGATIVE 11/18/2018 0200   LEUKOCYTESUR SMALL (A) 11/18/2018 0200   Recent Results (from the past 240 hour(s))  MRSA PCR Screening     Status: None   Collection Time: 11/13/18  2:20 AM   Specimen: Nasopharyngeal  Result Value Ref Range Status   MRSA by PCR NEGATIVE NEGATIVE Final    Comment:        The GeneXpert MRSA Assay (FDA approved for NASAL specimens only), is one component of a comprehensive MRSA colonization surveillance program. It is not intended to diagnose MRSA infection nor to guide or monitor treatment for MRSA infections. Performed at [facility], [place] [address] [place], [place] [address]   Culture, Urine     Status: Abnormal   Collection Time: 11/18/18  2:00 AM   Specimen: Urine, Random  Result Value Ref Range Status   Specimen Description   Final    URINE, RANDOM Performed at [place] [address] [place], [place] [address]    Special Requests   Final    NONE Performed at [facility], [place] [address], [place], [place] [address]    Culture (A)  Final    >=100,000 COLONIES/mL PROTEUS MIRABILIS >=100,000 COLONIES/mL ENTEROCOCCUS FAECALIS    Report Status 11/20/2018 FINAL  Final   Organism ID, Bacteria PROTEUS MIRABILIS (A)  Final   Organism ID, Bacteria ENTEROCOCCUS FAECALIS (A)  Final      Susceptibility   Enterococcus faecalis - MIC*      AMPICILLIN <=2 SENSITIVE Sensitive     LEVOFLOXACIN 1 SENSITIVE Sensitive     NITROFURANTOIN <=16 SENSITIVE Sensitive     VANCOMYCIN 1 SENSITIVE Sensitive     * >=100,000 COLONIES/mL ENTEROCOCCUS FAECALIS   Proteus mirabilis - MIC*    AMPICILLIN <=2 SENSITIVE Sensitive     CEFAZOLIN <=4 SENSITIVE Sensitive     CEFTRIAXONE <=1 SENSITIVE Sensitive     CIPROFLOXACIN <=0.25 SENSITIVE Sensitive     GENTAMICIN <=1 SENSITIVE Sensitive     IMIPENEM 4 SENSITIVE Sensitive     NITROFURANTOIN 128 RESISTANT Resistant     TRIMETH/SULFA <=20  SENSITIVE Sensitive     AMPICILLIN/SULBACTAM <=2 SENSITIVE Sensitive     PIP/TAZO <=4 SENSITIVE Sensitive     * >=100,000 COLONIES/mL PROTEUS MIRABILIS      Radiology Studies: No results found.  Scheduled Meds:  acidophilus  1 capsule Oral BID   aspirin  81 mg Per Tube Daily   dexamethasone (DECADRON) injection  6 mg Intravenous Daily   diltiazem  30 mg Per Tube Q6H   dutasteride  0.5 mg Oral Daily   enoxaparin (LOVENOX) injection  40 mg Subcutaneous Q24H   feeding supplement (ENSURE ENLIVE)  237 mL Oral BID BM   feeding supplement (PRO-STAT SUGAR FREE 64)  30 mL Per Tube Daily   free water  200 mL Per Tube Q6H   insulin aspart  0-9 Units Subcutaneous TID WC   memantine  10 mg Oral BID   metoprolol tartrate  2.5 mg Intravenous Q8H   mirtazapine  15 mg Oral QHS   multivitamin  15 mL Per Tube Daily   nystatin  1 g Topical BID   scopolamine  1 patch Transdermal Q72H   Continuous Infusions:  ampicillin-sulbactam (UNASYN) IV 3 g (11/22/18 1407)   dextrose 100 mL/hr at 11/22/18 1012   feeding supplement (OSMOLITE 1.2 CAL) 1,000 mL (11/22/18 1012)     LOS: 10 days    Phillips Climes, MD [facility] [email or web address] 11/22/2018, 3:39 PM

## 2018-11-22 NOTE — Plan of Care (Signed)
  Problem: Education: Goal: Knowledge of General Education information will improve Description: Including pain rating scale, medication(s)/side effects and non-pharmacologic comfort measures Outcome: Not Progressing   

## 2018-11-23 LAB — CBC
HCT: 48.1 % (ref 39.0–52.0)
Hemoglobin: 15.8 g/dL (ref 13.0–17.0)
MCH: 31.6 pg (ref 26.0–34.0)
MCHC: 32.8 g/dL (ref 30.0–36.0)
MCV: 96.2 fL (ref 80.0–100.0)
Platelets: 223 10*3/uL (ref 150–400)
RBC: 5 MIL/uL (ref 4.22–5.81)
RDW: 13.1 % (ref 11.5–15.5)
WBC: 16.5 10*3/uL — ABNORMAL HIGH (ref 4.0–10.5)
nRBC: 0 % (ref 0.0–0.2)

## 2018-11-23 LAB — COMPREHENSIVE METABOLIC PANEL WITH GFR
ALT: 54 U/L — ABNORMAL HIGH (ref 0–44)
AST: 56 U/L — ABNORMAL HIGH (ref 15–41)
Albumin: 2.5 g/dL — ABNORMAL LOW (ref 3.5–5.0)
Alkaline Phosphatase: 83 U/L (ref 38–126)
Anion gap: 11 (ref 5–15)
BUN: 25 mg/dL — ABNORMAL HIGH (ref 8–23)
CO2: 27 mmol/L (ref 22–32)
Calcium: 8.8 mg/dL — ABNORMAL LOW (ref 8.9–10.3)
Chloride: 103 mmol/L (ref 98–111)
Creatinine, Ser: 0.74 mg/dL (ref 0.61–1.24)
GFR calc Af Amer: >60 mL/min
GFR calc non Af Amer: >60 mL/min
Glucose, Bld: 70 mg/dL (ref 70–99)
Potassium: 4.4 mmol/L (ref 3.5–5.1)
Sodium: 141 mmol/L (ref 135–145)
Total Bilirubin: 1.3 mg/dL — ABNORMAL HIGH (ref 0.3–1.2)
Total Protein: 5.8 g/dL — ABNORMAL LOW (ref 6.5–8.1)

## 2018-11-23 LAB — C-REACTIVE PROTEIN: CRP: 6.6 mg/dL — ABNORMAL HIGH

## 2018-11-23 LAB — FERRITIN: Ferritin: 398 ng/mL — ABNORMAL HIGH (ref 24–336)

## 2018-11-23 LAB — GLUCOSE, CAPILLARY
Glucose-Capillary: 113 mg/dL — ABNORMAL HIGH (ref 70–99)
Glucose-Capillary: 124 mg/dL — ABNORMAL HIGH (ref 70–99)
Glucose-Capillary: 129 mg/dL — ABNORMAL HIGH (ref 70–99)

## 2018-11-23 MED ORDER — DEXAMETHASONE 6 MG PO TABS
6.0000 mg | ORAL_TABLET | Freq: Every day | ORAL | Status: DC
  Administered 2018-11-23 – 2018-11-24 (×2): 6 mg via ORAL
  Filled 2018-11-23 (×2): qty 1

## 2018-11-23 MED ORDER — AMOXICILLIN-POT CLAVULANATE 875-125 MG PO TABS
1.0000 | ORAL_TABLET | Freq: Two times a day (BID) | ORAL | Status: DC
  Administered 2018-11-23 – 2018-11-24 (×3): 1 via ORAL
  Filled 2018-11-23 (×4): qty 1

## 2018-11-23 MED ORDER — METOPROLOL TARTRATE 5 MG/5ML IV SOLN: 5.0000 mg | INTRAVENOUS | Status: DC | PRN

## 2018-11-23 NOTE — Progress Notes (Addendum)
PROGRESS NOTE  Stephen Roth.  [id number] DOB: [birth year] DOA: 11/12/2018 PCP: Venia Carbon, MD   Brief Narrative:  Patient is a 85 y.o. male with PMHx of dementia, chronic systolic heart failure-s/p AICD, cirrhosis, NPH-resident of a skilled nursing facility-presented to [facility] ED on 10/30 with worsening confusion, found to have COVID-19 viral pneumonia, hyponatremia and A. fib with RVR.  See below for further details.  Per ED note at [facility]-patient was diagnosed with COVID-19 at [facility] skilled nursing facility in [place] approximately 12 days prior to admission.  Subjective:  No significant events overnight as discussed with staff.   Assessment & Plan: Principal Problem:   Acute encephalopathy Active Problems:   Hypernatremia   AKI (acute kidney injury) ([place])   Dementia without behavioral disturbance ([place])   COVID-19 virus infection   Atrial fibrillation with RVR (HCC)   Pressure ulcer of left heel  Covid-19 pneumonia:  - Is requiring oxygen intermittently , remains on 2 L nasal cannula today, but saturating in the upper 90s. - Received  remdesivir 10/31 - 11/4 -Continue with IV Decadron, started after he started to develop hypoxia -Patient is extremely frail, with altered mental status, ,lethargic,unable to comply with incentive spirometry.  COVID-19 Labs  Recent Labs    11/21/18 0457 11/22/18 0840 11/23/18 0146  FERRITIN 393* 329 398*  CRP 10.8* 10.4* 6.6*    No results found for: 0000000   Acute metabolic encephalopathy on chronic advanced dementia:  - Acute metabolic encephalopathy likely multifactorial-secondary to COVID-19 infection, hypernatremia and AKI. CT head without acute findings.  -Patient remains significantly confused, lethargic, even though more awake but does not follow command and will not be able to swallow any food safely.  UTI -Patient with positive UA, urine culture growing Proteus mirabilis and Enterococcus faecalis,  initially on IV Unasyn, currently transitioned to Augmentin, to finish total of 7 days treatment.  Dysphagia -Patient appears to be having significant upper airway respiratory sounds, very likely that he is unable to clear his own secretions, this has slightly improved on scopolamine patch, I have been giving him glycopyrrolate intermittently as well. - requiring frequent NTS -this is  secondary to his own secretions, unlikely  tube feed, as he continued having these findings even when his tube feeds were held .  Hypernatremia:  -resolved, will decrease D5W to 50 cc/h  Hypokalemia: Improved - replete as needed  AKI:  -Resolved  A. fib with RVR:  -Initially required diltiazem drip, and IV metoprolol pushes, now his having core track, heart rate is controlled on Cardizem CD . - Continue ASA 81mg  as he is significant fall risk and possible hx GI bleeding, so not on full dose anticoagulation.    Chronic combined heart failure: Remains compensated, latest EF showed improvement per family report.  - Watch volume status while on IV fluids.  Hold diuretic given hypernatremia and AKI with poor per oral intake.  Dementia: Appears to be advanced  - Resume namenda once oral intake is slightly better.  NPH: Stable for outpatient follow-up with outpatient MDs  History of liver cirrhosis: Compensated - Monitor LFTs while on remdesivir, modestly elevated.  - Continue outpatient follow-up  Failure to thrive: Very weak/debilitated at baseline-now has encephalopathy from Covid and electrolyte abnormalities.  -Started on tube feed , at her phosphorus level closely   palliative care: DNR in place.  Spoke with granddaughter Dr. Josephine Cables who is a hospitalist with the [facility] system.  Goals of care are for gentle medical  treatment-family does not desire aggressive care.  Despite current measures including tube feeds, flat corrections, patient remains extremely air, with anticipated very all  poor prognosis, have been discussing his care with granddaughter Dr. Mancel Bale on a daily basis, she is being updated.  Deep tissue, pressure injury to left heel, POA: Pressure Injury 11/12/18 Heel Left Deep Tissue Injury - Purple or maroon localized area of discolored intact skin or blood-filled blister due to damage of underlying soft tissue from pressure and/or shear. maroon with yellow blister-type area in center;  (Active)  11/12/18   Location: Heel  Location Orientation: Left  Staging: Deep Tissue Injury - Purple or maroon localized area of discolored intact skin or blood-filled blister due to damage of underlying soft tissue from pressure and/or shear.  Wound Description (Comments): maroon with yellow blister-type area in center; boggy  Present on Admission: Yes    Addendum/goals of care discussion : updated 7:15 PM. -Discussed goals of care with the family, including granddaughter Dr. Mancel Bale, and daughter Eulas Post, and spouse, current goals of care is hospice, will check COVID-19 test, as if negative he will qualify for hospice facility, otherwise would consider his previous facility with hospice if allows visitation, social worker consulted, they would like to DC tube feed, and DC NGT as well, and allow for feeding for comfort, they are away of increased risk aspiration, and they want [place] to try to feed him if possible, will continue with medications via IV pushes, continue with IV fluid, him strong enough till he is placed and able to be visited by family, social work consult placed.  DVT prophylaxis: Lovenox Code Status: DNR Family Communication: Granddaughter by phone updated daily Disposition Plan: Uncertain,   Consultants:   None  Procedures:   None  Antimicrobials:  Remdesivir    Objective: Vitals:   11/23/18 0500 11/23/18 0721 11/23/18 1424 11/23/18 1549  BP:  98/82 105/63 115/76  Pulse:  63 (!) 59 60  Resp:  16 18 15   Temp:  97.6 F (36.4 C)  97.7 F (36.5 C)   TempSrc:  Axillary  Axillary  SpO2:  98% 96% 96%  Weight: 81.5 kg     Height:       No intake or output data in the 24 hours ending 11/23/18 1559 Filed Weights   11/19/18 0411 11/20/18 0423 11/23/18 0500  Weight: 77.6 kg 81 kg 81.5 kg    Physical exam:  Patient is lethargic, but open his eyes, and mumbles to verbal stimuli, does not follow commands or answer any questions appropriately , extremely frail. Symmetrical Chest wall movement, diminished air entry, no wheezing. RRR,No Gallops,Rubs or new Murmurs, No Parasternal Heave +ve B.Sounds, Abd Soft, No tenderness, No rebound - guarding or rigidity. No Cyanosis, Clubbing or edema, No new Rash or bruise       Data Reviewed: I have personally reviewed following labs and imaging studies  CBC: Recent Labs  Lab 11/17/18 0605  11/19/18 0131 11/20/18 0439 11/21/18 0457 11/22/18 0840 11/23/18 0146  WBC 9.7   < > 12.2* 13.6* 17.7* 15.4* 16.5*  NEUTROABS 8.3*  --   --   --   --   --   --   HGB 17.8*   < > 15.2 16.3 15.9 14.4 15.8  HCT 55.4*   < > 48.1 50.5 49.0 44.6 48.1  MCV 100.0   < > 99.4 97.9 97.0 96.7 96.2  PLT 240   < > 219 211 212 195 223   < > =  values in this interval not displayed.   Basic Metabolic Panel: Recent Labs  Lab 11/18/18 0535 11/19/18 0131 11/20/18 0439 11/21/18 0457 11/22/18 0840 11/23/18 0146  NA 153* 150* 143 140 140 141  K 4.1 4.3 4.7 5.0 4.8 4.4  CL 119* 114* 104 104 104 103  CO2 26 26 30 26 29 27   GLUCOSE 224* 125* 142* 99 140* 70  BUN 46* 40* 28* 27* 26* 25*  CREATININE 1.32* 1.22 0.91 0.96 0.88 0.74  CALCIUM 8.7* 8.4* 8.5* 8.6* 8.5* 8.8*  MG 2.9* 2.7* 2.4 2.5* 2.5*  --   PHOS 2.4* 3.1 3.2 3.4 3.2  --    GFR: Estimated Creatinine Clearance: 66 mL/min (by C-G formula based on SCr of 0.74 mg/dL). Liver Function Tests: Recent Labs  Lab 11/19/18 0131 11/20/18 0439 11/21/18 0457 11/22/18 0840 11/23/18 0146  AST 23 29 22 30  56*  ALT 24 28 23 28  54*  ALKPHOS 83 91 88 74 83   BILITOT 1.2 1.2 1.0 0.9 1.3*  PROT 5.2* 5.5* 5.7* 5.3* 5.8*  ALBUMIN 2.4* 2.4* 2.4* 2.1* 2.5*   No results for input(s): LIPASE, AMYLASE in the last 168 hours. No results for input(s): AMMONIA in the last 168 hours. Coagulation Profile: No results for input(s): INR, PROTIME in the last 168 hours. Cardiac Enzymes: No results for input(s): CKTOTAL, CKMB, CKMBINDEX, TROPONINI in the last 168 hours. BNP (last 3 results) No results for input(s): PROBNP in the last 8760 hours. HbA1C: No results for input(s): HGBA1C in the last 72 hours. CBG: Recent Labs  Lab 11/22/18 1155 11/22/18 1622 11/22/18 2054 11/23/18 0720 11/23/18 1128  GLUCAP 156* 138* 126* 113* 124*   Lipid Profile: No results for input(s): CHOL, HDL, LDLCALC, TRIG, CHOLHDL, LDLDIRECT in the last 72 hours. Thyroid Function Tests: No results for input(s): TSH, T4TOTAL, FREET4, T3FREE, THYROIDAB in the last 72 hours. Anemia Panel: Recent Labs    11/22/18 0840 11/23/18 0146  FERRITIN 329 398*   Urine analysis:    Component Value Date/Time   COLORURINE AMBER (A) 11/18/2018 0200   APPEARANCEUR HAZY (A) 11/18/2018 0200   LABSPEC 1.014 11/18/2018 0200   PHURINE 7.0 11/18/2018 0200   GLUCOSEU NEGATIVE 11/18/2018 0200   HGBUR NEGATIVE 11/18/2018 0200   BILIRUBINUR NEGATIVE 11/18/2018 0200   KETONESUR NEGATIVE 11/18/2018 0200   PROTEINUR 100 (A) 11/18/2018 0200   NITRITE NEGATIVE 11/18/2018 0200   LEUKOCYTESUR SMALL (A) 11/18/2018 0200   Recent Results (from the past 240 hour(s))  Culture, Urine     Status: Abnormal   Collection Time: 11/18/18  2:00 AM   Specimen: Urine, Random  Result Value Ref Range Status   Specimen Description   Final    URINE, RANDOM Performed at [facility], [place] [address] [place], [place] [address]    Special Requests   Final    NONE Performed at [facility], [place] [address] [place], [place] [address]    Culture (A)  Final    >=100,000  COLONIES/mL PROTEUS MIRABILIS >=100,000 COLONIES/mL ENTEROCOCCUS FAECALIS    Report Status 11/20/2018 FINAL  Final   Organism ID, Bacteria PROTEUS MIRABILIS (A)  Final   Organism ID, Bacteria ENTEROCOCCUS FAECALIS (A)  Final      Susceptibility   Enterococcus faecalis - MIC*    AMPICILLIN <=2 SENSITIVE Sensitive     LEVOFLOXACIN 1 SENSITIVE Sensitive     NITROFURANTOIN <=16 SENSITIVE Sensitive     VANCOMYCIN 1 SENSITIVE Sensitive     * >=  100,000 COLONIES/mL ENTEROCOCCUS FAECALIS   Proteus mirabilis - MIC*    AMPICILLIN <=2 SENSITIVE Sensitive     CEFAZOLIN <=4 SENSITIVE Sensitive     CEFTRIAXONE <=1 SENSITIVE Sensitive     CIPROFLOXACIN <=0.25 SENSITIVE Sensitive     GENTAMICIN <=1 SENSITIVE Sensitive     IMIPENEM 4 SENSITIVE Sensitive     NITROFURANTOIN 128 RESISTANT Resistant     TRIMETH/SULFA <=20 SENSITIVE Sensitive     AMPICILLIN/SULBACTAM <=2 SENSITIVE Sensitive     PIP/TAZO <=4 SENSITIVE Sensitive     * >=100,000 COLONIES/mL PROTEUS MIRABILIS      Radiology Studies: No results found.  Scheduled Meds: . acidophilus  1 capsule Oral BID  . amoxicillin-clavulanate  1 tablet Oral Q12H  . aspirin  81 mg Per Tube Daily  . dexamethasone  6 mg Oral Daily  . diltiazem  30 mg Per Tube Q6H  . dutasteride  0.5 mg Oral Daily  . enoxaparin (LOVENOX) injection  40 mg Subcutaneous Q24H  . feeding supplement (ENSURE ENLIVE)  237 mL Oral BID BM  . feeding supplement (PRO-STAT SUGAR FREE 64)  30 mL Per Tube Daily  . free water  200 mL Per Tube Q6H  . insulin aspart  0-9 Units Subcutaneous TID WC  . memantine  10 mg Oral BID  . metoprolol tartrate  2.5 mg Intravenous Q8H  . mirtazapine  15 mg Oral QHS  . multivitamin  15 mL Per Tube Daily  . nystatin  1 g Topical BID  . scopolamine  1 patch Transdermal Q72H   Continuous Infusions: . dextrose 100 mL/hr at 11/23/18 1126  . feeding supplement (OSMOLITE 1.2 CAL) 20 mL/hr at 11/22/18 1853     LOS: 11 days    Phillips Climes, MD  [facility] [email or web address] 11/23/2018, 3:59 PM

## 2018-11-23 NOTE — Progress Notes (Signed)
Paged doctor regarding pt pulling IV out and not able to get new IV due to pt pulling away and combative.  Okay to leave out at this time.

## 2018-11-23 NOTE — Plan of Care (Signed)
  Problem: Skin Integrity: Goal: Risk for impaired skin integrity will decrease 11/23/2018 1055 by Mariane Baumgarten, RN Outcome: Progressing

## 2018-11-23 NOTE — Progress Notes (Signed)
Nutrition Follow-up  DOCUMENTATION CODES:   Not applicable  INTERVENTION:    Osmolite 1.2 at 30 ml/h, recommend increase by 10 ml every 4 hours to goal rate of 70 ml/h (1680 ml per day)   Pro-stat 30 ml once daily   Provides 2116 kcal, 108 gm protein, 1378 ml free water daily   NUTRITION DIAGNOSIS:   Increased nutrient needs related to acute illness as evidenced by estimated needs.  Ongoing   GOAL:   Patient will meet greater than or equal to 90% of their needs  Progressing  MONITOR:   PO intake, Supplement acceptance, Labs, Weight trends, I & O's, Skin  REASON FOR ASSESSMENT:   Consult Enteral/tube feeding initiation and management  ASSESSMENT:   85 y.o. male with medical history significant of Dementia, NPH, cirrhosis, HTN, CHF. Patient resides in SNF at baseline due to fairly advanced dementia.  SLP following for dysphagia. He remains on a dysphagia 1 diet with honey thick liquids. Patient is not safe for PO's at this time, so nursing has not been feeding him.   Cortrak tube in place. Tube feeding was held due to increased secretions. Osmolite 1.2 has been resumed and was increased to 30 ml/h yesterday.  Labs reviewed.  CBG's: [phone]  Medications reviewed and include acidophilus, decadron, novolog, remeron, MVI, free water 200 ml every 6 hours.   Diet Order:   Diet Order            DIET - DYS 1 Room service appropriate? Yes; Fluid consistency: Honey Thick  Diet effective now              EDUCATION NEEDS:   No education needs have been identified at this time  Skin:   DTI: L heel Other: MASD to scrotum  Last BM:  11/8  Height:   Ht Readings from Last 1 Encounters:  11/18/18 6' (1.829 m)    Weight:   Wt Readings from Last 1 Encounters:  11/23/18 81.5 kg    Ideal Body Weight:  80.9 kg  BMI:  Body mass index is 24.37 kg/m.  Estimated Nutritional Needs:   Kcal:  1950-2150  Protein:  105-120g  Fluid:   2L/day    Molli Barrows, RD, LDN, [place] Pager [phone] After Hours Pager [phone]

## 2018-11-24 DIAGNOSIS — J1289 Other viral pneumonia: Secondary | ICD-10-CM

## 2018-11-24 LAB — GLUCOSE, CAPILLARY
Glucose-Capillary: 107 mg/dL — ABNORMAL HIGH (ref 70–99)
Glucose-Capillary: 55 mg/dL — ABNORMAL LOW (ref 70–99)
Glucose-Capillary: 85 mg/dL (ref 70–99)
Glucose-Capillary: 88 mg/dL (ref 70–99)
Glucose-Capillary: 97 mg/dL (ref 70–99)
Glucose-Capillary: 99 mg/dL (ref 70–99)

## 2018-11-24 LAB — SARS CORONAVIRUS 2 (TAT 6-24 HRS): SARS Coronavirus 2: POSITIVE — AB

## 2018-11-24 MED ORDER — DEXTROSE 50 % IV SOLN
INTRAVENOUS | Status: AC
  Filled 2018-11-24: qty 50

## 2018-11-24 MED ORDER — DEXTROSE 50 % IV SOLN
12.5000 g | INTRAVENOUS | Status: AC
  Administered 2018-11-24: 12.5 g via INTRAVENOUS
  Filled 2018-11-24: qty 50

## 2018-11-24 NOTE — Progress Notes (Signed)
   11/24/18 1546  Family/Significant Other Communication  Family/Significant Other Update Called;Updated;Other (Comment) Stephen Roth, daughter /  [phone])

## 2018-11-24 NOTE — Progress Notes (Signed)
CRITICAL VALUE ALERT  Critical Value:  Re-test COVID result 11/23/2018:  COVID, POSTIVE   Date & Time Notied:  11/24/2018  13:05  Provider Notified: Dr. Maryland Pink  Orders Received/Actions taken: No orders at present time.

## 2018-11-24 NOTE — Plan of Care (Signed)
Patient confused, agitated at times.

## 2018-11-24 NOTE — Progress Notes (Addendum)
CRITICAL VALUE ALERT  Critical Value:  Blood glucose POC 55   Date & Time Notied:  11/24/2018  11:58  Provider Notified: Dr. Maryland Pink  Orders Received/Actions taken: Patient asymptomatic, but is not accepting PO intake at present time.  Orders received for Dextrose 50% 12.5g IVP now.

## 2018-11-24 NOTE — Progress Notes (Signed)
  Speech Language Pathology Treatment: Dysphagia  Patient Details Name: Stephen Roth. MRN: [id number] DOB: Apr 01, 1925 Today's Date: 11/24/2018 Time: [id number] SLP Time Calculation (min) (ACUTE ONLY): 30 min  Assessment / Plan / Recommendation Clinical Impression  Given plan for full comfort and family request to MD for comfort feeding SLP attempted this and provided written instructions to staff. Pt lethargic and restless initially, but gradually woke up appropriately. With verbal explanation to prepare pt prior to being touched, pt allow repositioning and PO trials without fearful combative behavior. Verbal cues and gentle tactile cues allowed pt to consistently make a labial seal on the spoon for bites of puree, ice and water.  Pt clearly enjoyed this. Pt does have immediate coughing. Initially only minimal, but increased after about 2 oz of puree given. Stopped at this point and pt returned to calm resting behavior. Written suggestions posted at [facility]. No further SLP interventions needed at this time given plan of care. Will sign off.   HPI HPI: Patient is a 85 y.o. male with PMHx of dementia, chronic systolic heart failure-s/p AICD, cirrhosis, NPH-resident of a skilled nursing facility-presented to [facility] ED on 10/30 with worsening confusion, found to have COVID-19 viral pneumonia, hyponatremia and A. fib with RVR.       SLP Plan  Continue with current plan of care       Recommendations  Diet recommendations: Dysphagia 1 (puree);Honey-thick liquid(ice or teaspoon of water ok - comfort feeding) Liquids provided via: Teaspoon Medication Administration: Crushed with puree Supervision: Staff to assist with self feeding;Full supervision/cueing for compensatory strategies Compensations: Minimize environmental distractions;Slow rate;Small sips/bites Postural Changes and/or Swallow Maneuvers: Seated upright 90 degrees;Upright 30-60 min after meal                Oral Care  Recommendations: Oral care BID Follow up Recommendations: 24 hour supervision/assistance SLP Visit Diagnosis: Dysphagia, oropharyngeal phase (R13.12) Plan: Continue with current plan of care       GO                Stephen Roth, Stephen Roth 11/24/2018, 11:41 AM

## 2018-11-24 NOTE — Progress Notes (Signed)
OT Cancellation Note/Discharge  Patient Details Name: Stephen Roth. MRN: [id number] DOB: Jun 02, 1925   Cancelled Treatment:    Reason Eval/Treat Not Completed: Other (comment) Pt comfort care with possible Hospice placement. OT signing off.   Whit Bruni,HILLARY 11/24/2018, 2:54 PM  Maurie Boettcher, OT/L   Acute OT Clinical Specialist Acute Rehabilitation Services Pager [phone] Office [phone]

## 2018-11-24 NOTE — Progress Notes (Signed)
CSW aware of consult for potential hospice placement vs. Returning to [facility] with hospice- per notes patient is waiting for new covid test to determine if he is negative for [facility] placement. If patient continues to be positive family will consider options at that time.   CSW has reached out to [facility] and they are able to accept patient back under hospice services and allow family visitations. CSW will follow up once covid results return.   Kingsley Spittle, LCSW [facility]  [phone]

## 2018-11-24 NOTE — Progress Notes (Signed)
Physical Therapy Discharge Patient Details Name: Stephen Roth. MRN: [id number] DOB: 09/03/1925 Today's Date: 11/24/2018 Time:  -     Patient discharged from PT services secondary to medical decline - Noted GOC for comfort/ Hospice.  Please see latest therapy progress note for current level of functioning and progress toward goals.    Progress and discharge plan discussed with patient and/or caregiver:NA  GP     [name] 11/24/2018, 7:08 AM [place]  Office [phone]

## 2018-11-24 NOTE — Progress Notes (Signed)
PROGRESS NOTE  Carlynn Spry.  [id number] DOB: 31-Jan-1925 DOA: 11/12/2018 PCP: Venia Carbon, MD   Brief Narrative: Patient is a 85 y.o. male with PMHx of dementia, chronic systolic heart failure-s/p AICD, cirrhosis, NPH-resident of a skilled nursing facility-presented to [facility] ED on 10/30 with worsening confusion, found to have COVID-19 viral pneumonia, hyponatremia and A. fib with RVR. Per ED note at [facility]-patient was diagnosed with COVID-19 at [facility] skilled nursing facility in [place] approximately 12 days prior to admission.   Subjective: Patient not communicative.  He is noted to be lethargic this morning.   Assessment & Plan:  Pneumonia due to COVID-19/acute respiratory failure with hypoxia  Requiring oxygen intermittently.  He has completed course of remdesivir.  He was given steroids as well.  Patient extremely frail with altered mental status.  He is lethargic.  Unable to comply with incentive spirometry.  He appears to be declining gradually.  His CRP had improved to 6.6 yesterday.  Acute metabolic encephalopathy on chronic advanced dementia:  - Acute metabolic encephalopathy likely multifactorial-secondary to COVID-19 infection, hypernatremia and AKI. CT head without acute findings.  -Patient remains significantly confused and lethargic.  Prognosis is very poor at this time.    Acute UTI -Patient with positive UA, urine culture growing Proteus mirabilis and Enterococcus faecalis, initially on IV Unasyn, currently transitioned to Augmentin, to finish total of 7 days treatment.  However he does not appear to be taking many of his medications.  Dysphagia -Patient appears to be aspirating.  He has been on scopolamine patch and has been getting glycopyrrolate intermittently.    Severe protein calorie malnutrition He also had poor oral intake.  He has been declining.  He initially was on tube feedings which was discontinued after discussions with  family.  Hypernatremia:  Resolved with IV fluids  Hypokalemia Was repleted  AKI:  -Resolved  A. fib with RVR:  -Initially required diltiazem drip, and IV metoprolol pushes.  Currently on diltiazem every 6 hours.  However unclear how much of it he is able to take orally.  No feeding tube noted this morning. - Continue ASA 81mg  as he is significant fall risk and possible hx GI bleeding, so not on full dose anticoagulation.    Chronic combined heart failure Remains compensated, latest EF showed improvement per family report.   Dementia/history of normal pressure hydrocephalus Advanced dementia.  Was on Namenda at home.  However prognosis now appears to be poor.  History of liver cirrhosis Compensated  Failure to thrive Very weak/debilitated at baseline-now has encephalopathy from Covid and electrolyte abnormalities.   Deep tissue, pressure injury to left heel, POA Pressure Injury 11/12/18 Heel Left Deep Tissue Injury - Purple or maroon localized area of discolored intact skin or blood-filled blister due to damage of underlying soft tissue from pressure and/or shear. maroon with yellow blister-type area in center;  (Active)  11/12/18   Location: Heel  Location Orientation: Left  Staging: Deep Tissue Injury - Purple or maroon localized area of discolored intact skin or blood-filled blister due to damage of underlying soft tissue from pressure and/or shear.  Wound Description (Comments): maroon with yellow blister-type area in center; boggy  Present on Admission: Yes   Goals of care Patient's primary point of contact is his granddaughter who is a physician/hospitalist with the [facility] system, Dr. Josephine Cables.  Goals of care conversations held with her by previous rounding physicians.  Plan is for transition to hospice services.  Feeding tube was discontinued.  Repeat COVID-19 test was ordered to determine disposition.  If the test is negative then he can go to residential hospice.   If it is positive then he may have to go to an alternative site.  DVT prophylaxis: Lovenox Code Status: DNR Family Communication: Will discuss with granddaughter Disposition Plan: Uncertain.  Repeat COVID-19 test is pending.  Consultants:   None  Procedures:   None  Antimicrobials:  Remdesivir    Objective: Vitals:   11/23/18 1424 11/23/18 1549 11/24/18 0700 11/24/18 0706  BP: 105/63 115/76 116/67 116/67  Pulse: (!) 59 60 60 61  Resp: 18 15  19   Temp:  97.7 F (36.5 C) (!) 97.5 F (36.4 C)   TempSrc:  Axillary Axillary   SpO2: 96% 96%  94%  Weight:      Height:        Intake/Output Summary (Last 24 hours) at 11/24/2018 1107 Last data filed at 11/24/2018 0725 Gross per 24 hour  Intake --  Output 1400 ml  Net -1400 ml   Filed Weights   11/19/18 0411 11/20/18 0423 11/23/18 0500  Weight: 77.6 kg 81 kg 81.5 kg    Physical exam:  General appearance: Patient noted to be lethargic.  Mumbling.  Does not answer questions. Resp: Coarse breath sounds bilaterally. Cardio: S1-S2 is normal regular.  No S3-S4.  No rubs murmurs or bruit GI: Abdomen is soft.  Nontender nondistended.  Bowel sounds are present normal.  No masses organomegaly Extremities: No edema.  Neurologic: Lethargic.    Data Reviewed: I have personally reviewed following labs and imaging studies  CBC: Recent Labs  Lab 11/19/18 0131 11/20/18 0439 11/21/18 0457 11/22/18 0840 11/23/18 0146  WBC 12.2* 13.6* 17.7* 15.4* 16.5*  HGB 15.2 16.3 15.9 14.4 15.8  HCT 48.1 50.5 49.0 44.6 48.1  MCV 99.4 97.9 97.0 96.7 96.2  PLT 219 211 212 195 [id number]   Basic Metabolic Panel: Recent Labs  Lab 11/18/18 0535 11/19/18 0131 11/20/18 0439 11/21/18 0457 11/22/18 0840 11/23/18 0146  NA 153* 150* 143 140 140 141  K 4.1 4.3 4.7 5.0 4.8 4.4  CL 119* 114* 104 104 104 103  CO2 26 26 30 26 29 27   GLUCOSE 224* 125* 142* 99 140* 70  BUN 46* 40* 28* 27* 26* 25*  CREATININE 1.32* 1.22 0.91 0.96 0.88 0.74   CALCIUM 8.7* 8.4* 8.5* 8.6* 8.5* 8.8*  MG 2.9* 2.7* 2.4 2.5* 2.5*  --   PHOS 2.4* 3.1 3.2 3.4 3.2  --    GFR: Estimated Creatinine Clearance: 66 mL/min (by C-G formula based on SCr of 0.74 mg/dL). Liver Function Tests: Recent Labs  Lab 11/19/18 0131 11/20/18 0439 11/21/18 0457 11/22/18 0840 11/23/18 0146  AST 23 29 22 30  56*  ALT 24 28 23 28  54*  ALKPHOS 83 91 88 74 83  BILITOT 1.2 1.2 1.0 0.9 1.3*  PROT 5.2* 5.5* 5.7* 5.3* 5.8*  ALBUMIN 2.4* 2.4* 2.4* 2.1* 2.5*   CBG: Recent Labs  Lab 11/23/18 0720 11/23/18 1128 11/23/18 1548 11/24/18 0022 11/24/18 0723  GLUCAP 113* 124* 129* 107* 99   Anemia Panel: Recent Labs    11/22/18 0840 11/23/18 0146  FERRITIN 329 398*   Urine analysis:    Component Value Date/Time   COLORURINE AMBER (A) 11/18/2018 0200   APPEARANCEUR HAZY (A) 11/18/2018 0200   LABSPEC 1.014 11/18/2018 0200   PHURINE 7.0 11/18/2018 0200   GLUCOSEU NEGATIVE 11/18/2018 0200   HGBUR NEGATIVE 11/18/2018 0200   [place] 11/18/2018 0200  KETONESUR NEGATIVE 11/18/2018 0200   PROTEINUR 100 (A) 11/18/2018 0200   NITRITE NEGATIVE 11/18/2018 0200   LEUKOCYTESUR SMALL (A) 11/18/2018 0200   Recent Results (from the past 240 hour(s))  Culture, Urine     Status: Abnormal   Collection Time: 11/18/18  2:00 AM   Specimen: Urine, Random  Result Value Ref Range Status   Specimen Description   Final    URINE, RANDOM Performed at [facility] [address] [place], [place] [address]    Special Requests   Final    NONE Performed at [facility], [place] [address] [place], [place] [address]    Culture (A)  Final    >=100,000 COLONIES/mL PROTEUS MIRABILIS >=100,000 COLONIES/mL ENTEROCOCCUS FAECALIS    Report Status 11/20/2018 FINAL  Final   Organism ID, Bacteria PROTEUS MIRABILIS (A)  Final   Organism ID, Bacteria ENTEROCOCCUS FAECALIS (A)  Final      Susceptibility   Enterococcus faecalis - MIC*     AMPICILLIN <=2 SENSITIVE Sensitive     LEVOFLOXACIN 1 SENSITIVE Sensitive     NITROFURANTOIN <=16 SENSITIVE Sensitive     VANCOMYCIN 1 SENSITIVE Sensitive     * >=100,000 COLONIES/mL ENTEROCOCCUS FAECALIS   Proteus mirabilis - MIC*    AMPICILLIN <=2 SENSITIVE Sensitive     CEFAZOLIN <=4 SENSITIVE Sensitive     CEFTRIAXONE <=1 SENSITIVE Sensitive     CIPROFLOXACIN <=0.25 SENSITIVE Sensitive     GENTAMICIN <=1 SENSITIVE Sensitive     IMIPENEM 4 SENSITIVE Sensitive     NITROFURANTOIN 128 RESISTANT Resistant     TRIMETH/SULFA <=20 SENSITIVE Sensitive     AMPICILLIN/SULBACTAM <=2 SENSITIVE Sensitive     PIP/TAZO <=4 SENSITIVE Sensitive     * >=100,000 COLONIES/mL PROTEUS MIRABILIS      Radiology Studies: No results found.  Scheduled Meds:  acidophilus  1 capsule Oral BID   amoxicillin-clavulanate  1 tablet Oral Q12H   aspirin  81 mg Per Tube Daily   dexamethasone  6 mg Oral Daily   diltiazem  30 mg Per Tube Q6H   dutasteride  0.5 mg Oral Daily   enoxaparin (LOVENOX) injection  40 mg Subcutaneous Q24H   feeding supplement (ENSURE ENLIVE)  237 mL Oral BID BM   feeding supplement (PRO-STAT SUGAR FREE 64)  30 mL Per Tube Daily   free water  200 mL Per Tube Q6H   insulin aspart  0-9 Units Subcutaneous TID WC   memantine  10 mg Oral BID   metoprolol tartrate  2.5 mg Intravenous Q8H   mirtazapine  15 mg Oral QHS   multivitamin  15 mL Per Tube Daily   nystatin  1 g Topical BID   scopolamine  1 patch Transdermal Q72H   Continuous Infusions:  dextrose 50 mL/hr at 11/23/18 2059     LOS: 12 days    Bonnielee Haff, MD [facility] [email or web address] 11/24/2018, 11:07 AM

## 2018-11-25 LAB — GLUCOSE, CAPILLARY: Glucose-Capillary: 81 mg/dL (ref 70–99)

## 2018-11-25 MED ORDER — HALOPERIDOL LACTATE 5 MG/ML IJ SOLN
2.0000 mg | Freq: Four times a day (QID) | INTRAMUSCULAR | Status: DC | PRN
  Administered 2018-11-25: 2 mg via INTRAVENOUS
  Filled 2018-11-25: qty 1

## 2018-11-25 NOTE — Progress Notes (Signed)
CSW spoke with patients daughter, [name], regarding discharge plans. Family prefers [facility] faxed information to facility.   If facility is not able to accept patient family is willing to consider [facility].   CSW will continue to follow.   Kingsley Spittle, LCSW [facility]  [phone]

## 2018-11-25 NOTE — Plan of Care (Signed)
Patient confused and agitated, will reinforce with family.

## 2018-11-25 NOTE — Progress Notes (Signed)
PROGRESS NOTE  Carlynn Spry.  [id number] DOB: August 31, 1925 DOA: 11/12/2018 PCP: Venia Carbon, MD   Brief Narrative: Patient is a 85 y.o. male with PMHx of dementia, chronic systolic heart failure-s/p AICD, cirrhosis, NPH-resident of a skilled nursing facility-presented to [facility] ED on 10/30 with worsening confusion, found to have COVID-19 viral pneumonia, hyponatremia and A. fib with RVR. Per ED note at [facility]-patient was diagnosed with COVID-19 at [facility] skilled nursing facility in [place] approximately 12 days prior to admission.   Subjective: Patient remains lethargic.   Assessment & Plan:  Pneumonia due to COVID-19/acute respiratory failure with hypoxia  Requiring oxygen intermittently.  Patient has completed course of remdesivir and steroids.  Patient remains lethargic.  Unable to comply with incentive spirometry.  Patient is declining.  Hospice is being pursued.    Acute metabolic encephalopathy on chronic advanced dementia:  Acute metabolic encephalopathy likely multifactorial-secondary to COVID-19 infection, hypernatremia and AKI. CT head without acute findings.  Patient remains lethargic and he was.  Prognosis is poor.  Acute UTI Patient with positive UA, urine culture growing Proteus mirabilis and Enterococcus faecalis, initially on IV Unasyn, currently transitioned to Augmentin, to finish total of 7 days treatment.    Dysphagia Patient appears to be aspirating.  He has been on scopolamine patch and has been getting glycopyrrolate intermittently.   Severe protein calorie malnutrition He also had poor oral intake.  He has been declining.  He initially was on tube feedings which was discontinued after discussions with family.  Hypernatremia:  Resolved with IV fluids  Hypokalemia Was repleted  AKI:  -Resolved  A. fib with RVR:  -Initially required diltiazem drip, and IV metoprolol pushes.  Currently on diltiazem every 6 hours.  However unclear how  much of it he is able to take orally.  No feeding tube noted this morning. - Continue ASA 81mg  as he is significant fall risk and possible hx GI bleeding, so not on full dose anticoagulation.    Chronic combined heart failure Remains compensated, latest EF showed improvement per family report.   Dementia/history of normal pressure hydrocephalus Advanced dementia.  Was on Namenda at home.  However prognosis now appears to be poor.  History of liver cirrhosis Compensated  Failure to thrive Very weak/debilitated at baseline-now has encephalopathy from Covid and electrolyte abnormalities.   Deep tissue, pressure injury to left heel, POA Pressure Injury 11/12/18 Heel Left Deep Tissue Injury - Purple or maroon localized area of discolored intact skin or blood-filled blister due to damage of underlying soft tissue from pressure and/or shear. maroon with yellow blister-type area in center;  (Active)  11/12/18   Location: Heel  Location Orientation: Left  Staging: Deep Tissue Injury - Purple or maroon localized area of discolored intact skin or blood-filled blister due to damage of underlying soft tissue from pressure and/or shear.  Wound Description (Comments): maroon with yellow blister-type area in center; boggy  Present on Admission: Yes   Goals of care Patient's primary point of contact is his granddaughter who is a physician/hospitalist with the [facility] system, Dr. Josephine Cables.  Goals of care conversations held with her by previous rounding physicians.  I also discussed with her yesterday.  Tube feedings were discontinued.  She and rest of the family aware of poor prognosis.  They would like for him to go to a facility which allows family visitations.  Social worker is following.  Repeat COVID-19 test was positive unfortunately.  Disposition is still not clear.  Stop  all nonessential medications.  Comfort measures.    DVT prophylaxis: Stop Lovenox and initiate comfort measures. Code  Status: DNR Family Communication: Discussed with granddaughter yesterday. Disposition Plan: Disposition is unclear.  Repeat COVID-19 test was positive.  Consultants:   None  Procedures:   None  Antimicrobials:  Remdesivir    Objective: Vitals:   11/24/18 1958 11/25/18 0012 11/25/18 0355 11/25/18 0842  BP: (!) 145/71 134/68 117/72 (!) 130/59  Pulse: 70  (!) 102 61  Resp:    19  Temp: 98 F (36.7 C)  97.8 F (36.6 C) 97.7 F (36.5 C)  TempSrc: Axillary  Axillary Axillary  SpO2:    93%  Weight:      Height:        Intake/Output Summary (Last 24 hours) at 11/25/2018 1006 Last data filed at 11/24/2018 2215 Gross per 24 hour  Intake 430 ml  Output 300 ml  Net 130 ml   Filed Weights   11/19/18 0411 11/20/18 0423 11/23/18 0500  Weight: 77.6 kg 81 kg 81.5 kg    Physical exam:  General appearance: Remains lethargic Resp: Shallow respirations.  Coarse breath sounds bilaterally. Cardio: S1-S2 is normal regular.  No S3-S4.  No rubs murmurs or bruit GI: Abdomen is soft.  Nontender nondistended.  Bowel sounds are present normal.  No masses organomegaly Extremities: No edema.  Neurologic: Lethargic   Data Reviewed: I have personally reviewed following labs and imaging studies  CBC: Recent Labs  Lab 11/19/18 0131 11/20/18 0439 11/21/18 0457 11/22/18 0840 11/23/18 0146  WBC 12.2* 13.6* 17.7* 15.4* 16.5*  HGB 15.2 16.3 15.9 14.4 15.8  HCT 48.1 50.5 49.0 44.6 48.1  MCV 99.4 97.9 97.0 96.7 96.2  PLT 219 211 212 195 [id number]   Basic Metabolic Panel: Recent Labs  Lab 11/19/18 0131 11/20/18 0439 11/21/18 0457 11/22/18 0840 11/23/18 0146  NA 150* 143 140 140 141  K 4.3 4.7 5.0 4.8 4.4  CL 114* 104 104 104 103  CO2 26 30 26 29 27   GLUCOSE 125* 142* 99 140* 70  BUN 40* 28* 27* 26* 25*  CREATININE 1.22 0.91 0.96 0.88 0.74  CALCIUM 8.4* 8.5* 8.6* 8.5* 8.8*  MG 2.7* 2.4 2.5* 2.5*  --   PHOS 3.1 3.2 3.4 3.2  --    GFR: Estimated Creatinine Clearance: 66 mL/min  (by C-G formula based on SCr of 0.74 mg/dL). Liver Function Tests: Recent Labs  Lab 11/19/18 0131 11/20/18 0439 11/21/18 0457 11/22/18 0840 11/23/18 0146  AST 23 29 22 30  56*  ALT 24 28 23 28  54*  ALKPHOS 83 91 88 74 83  BILITOT 1.2 1.2 1.0 0.9 1.3*  PROT 5.2* 5.5* 5.7* 5.3* 5.8*  ALBUMIN 2.4* 2.4* 2.4* 2.1* 2.5*   CBG: Recent Labs  Lab 11/24/18 0723 11/24/18 1144 11/24/18 1312 11/24/18 1637 11/24/18 2131  GLUCAP 99 55* 97 88 85   Anemia Panel: Recent Labs    11/23/18 0146  FERRITIN 398*   Urine analysis:    Component Value Date/Time   COLORURINE AMBER (A) 11/18/2018 0200   APPEARANCEUR HAZY (A) 11/18/2018 0200   LABSPEC 1.014 11/18/2018 0200   PHURINE 7.0 11/18/2018 0200   GLUCOSEU NEGATIVE 11/18/2018 0200   HGBUR NEGATIVE 11/18/2018 0200   BILIRUBINUR NEGATIVE 11/18/2018 0200   KETONESUR NEGATIVE 11/18/2018 0200   PROTEINUR 100 (A) 11/18/2018 0200   NITRITE NEGATIVE 11/18/2018 0200   LEUKOCYTESUR SMALL (A) 11/18/2018 0200   Recent Results (from the past 240 hour(s))  Culture, Urine  Status: Abnormal   Collection Time: 11/18/18  2:00 AM   Specimen: Urine, Random  Result Value Ref Range Status   Specimen Description   Final    URINE, RANDOM Performed at [facility] [address], [place], [place] [address]    Special Requests   Final    NONE Performed at [facility], [place] [address] [place], [place] [address]    Culture (A)  Final    >=100,000 COLONIES/mL PROTEUS MIRABILIS >=100,000 COLONIES/mL ENTEROCOCCUS FAECALIS    Report Status 11/20/2018 FINAL  Final   Organism ID, Bacteria PROTEUS MIRABILIS (A)  Final   Organism ID, Bacteria ENTEROCOCCUS FAECALIS (A)  Final      Susceptibility   Enterococcus faecalis - MIC*    AMPICILLIN <=2 SENSITIVE Sensitive     LEVOFLOXACIN 1 SENSITIVE Sensitive     NITROFURANTOIN <=16 SENSITIVE Sensitive     VANCOMYCIN 1 SENSITIVE Sensitive     * >=100,000 COLONIES/mL  ENTEROCOCCUS FAECALIS   Proteus mirabilis - MIC*    AMPICILLIN <=2 SENSITIVE Sensitive     CEFAZOLIN <=4 SENSITIVE Sensitive     CEFTRIAXONE <=1 SENSITIVE Sensitive     CIPROFLOXACIN <=0.25 SENSITIVE Sensitive     GENTAMICIN <=1 SENSITIVE Sensitive     IMIPENEM 4 SENSITIVE Sensitive     NITROFURANTOIN 128 RESISTANT Resistant     TRIMETH/SULFA <=20 SENSITIVE Sensitive     AMPICILLIN/SULBACTAM <=2 SENSITIVE Sensitive     PIP/TAZO <=4 SENSITIVE Sensitive     * >=100,000 COLONIES/mL PROTEUS MIRABILIS  SARS CORONAVIRUS 2 (TAT 6-24 HRS) Nasopharyngeal Nasopharyngeal Swab     Status: Abnormal   Collection Time: 11/23/18  7:00 PM   Specimen: Nasopharyngeal Swab  Result Value Ref Range Status   SARS Coronavirus 2 POSITIVE (A) NEGATIVE Final    Comment: RESULT CALLED TO, READ BACK BY AND VERIFIED WITH: Anselmo Rod RN 13:10 11/24/18 ([email or web address]) (NOTE) SARS-CoV-2 target nucleic acids are DETECTED. The SARS-CoV-2 RNA is generally detectable in upper and lower respiratory specimens during the acute phase of infection. Positive results are indicative of active infection with SARS-CoV-2. Clinical  correlation with patient history and other diagnostic information is necessary to determine patient infection status. Positive results do  not rule out bacterial infection or co-infection with other viruses. The expected result is Negative. Fact Sheet for Patients: SugarRoll.be Fact Sheet for Healthcare Providers: https://www.woods-mathews.com/ This test is not yet approved or cleared by the [place] FDA and  has been authorized for detection and/or diagnosis of SARS-CoV-2 by FDA under an Emergency Use Authorization (EUA). This EUA will remain  in effect (meaning this test can be used) fo r the duration of the COVID-19 declaration under Section 564(b)(1) of the Act, 21 U.S.C. section 360bbb-3(b)(1), unless the authorization is terminated or revoked sooner.  Performed at [facility], [place] [address], [place], [place] [address]       Radiology Studies: No results found.  Scheduled Meds: . diltiazem  30 mg Per Tube Q6H  . enoxaparin (LOVENOX) injection  40 mg Subcutaneous Q24H  . feeding supplement (ENSURE ENLIVE)  237 mL Oral BID BM  . metoprolol tartrate  2.5 mg Intravenous Q8H  . nystatin  1 g Topical BID  . scopolamine  1 patch Transdermal Q72H   Continuous Infusions: . dextrose 50 mL/hr at 11/24/18 1700     LOS: 13 days    Bonnielee Haff, MD [facility] [email or web address] 11/25/2018, 10:06 AM

## 2018-11-25 NOTE — Progress Notes (Signed)
Looks like went full comfort measures during day.  No labs ordered for AM, nor any since 11/10.  Will stop the D5W for now.

## 2018-11-25 NOTE — Progress Notes (Signed)
   11/25/18 1823  Family/Significant Other Communication  Family/Significant Other Update Called;Updated;Other (Comment) (Daughter and patient's wife via FaceTime call at bedside.)

## 2018-11-26 LAB — GLUCOSE, CAPILLARY: Glucose-Capillary: 92 mg/dL (ref 70–99)

## 2018-11-26 MED ORDER — SCOPOLAMINE 1 MG/3DAYS TD PT72: 1.0000 | MEDICATED_PATCH | TRANSDERMAL | 12 refills | Status: AC

## 2018-11-26 MED ORDER — HALOPERIDOL LACTATE 5 MG/ML IJ SOLN: 2.0000 mg | Freq: Four times a day (QID) | INTRAMUSCULAR | Status: AC | PRN

## 2018-11-26 NOTE — Discharge Summary (Signed)
[facility]  Physician Discharge Summary   Patient ID: Stephen Roth. MRN: [id number] DOB/AGE: [birth year] 85 y.o.  Admit date: 11/12/2018 Discharge date: 11/26/2018  PCP: Venia Carbon, MD  DISCHARGE DIAGNOSES:  Pneumonia due to COVID-19 Acute respiratory failure with hypoxia Acute metabolic encephalopathy Advanced dementia Acute urinary tract infection Dysphagia Severe protein calorie malnutrition Hypernatremia, resolved Acute kidney injury, resolved Atrial fibrillation, chronic Chronic combined CHF History of liver cirrhosis   RECOMMENDATIONS FOR OUTPATIENT FOLLOW UP: 1. Patient being discharged to residential hospice.   Home Health: Not applicable Equipment/Devices: None  CODE STATUS: DNR  DISCHARGE CONDITION: Poor  Diet recommendation: Comfort feeds as tolerated  INITIAL HISTORY: Patient is a 85 y.o. male with PMHx of dementia, chronic systolic heart failure-s/p AICD, cirrhosis, NPH-resident of a skilled nursing facility-presented to [facility] ED on 10/30 with worsening confusion, found to have COVID-19 viral pneumonia, hyponatremia and A. fib with RVR. Per ED note at [facility]-patient was diagnosed with COVID-19 at [facility] skilled nursing facility in [place] approximately 12 days prior to admission.    HOSPITAL COURSE:   Pneumonia due to COVID-19/acute respiratory failure with hypoxia  Requiring oxygen intermittently.  Patient completed course of remdesivir and steroids.  Patient remains lethargic.  Unable to comply with incentive spirometry.  Patient thought to be declining.  Poor prognosis discussed with family.  Hospice was pursued.  He will be discharged to residential hospice today.  Acute metabolic encephalopathy on chronic advanced dementia:  Acute metabolic encephalopathy likely multifactorial-secondary to COVID-19 infection, hypernatremia and AKI. CT head without acute findings.   Acute UTI Patient with positive UA, urine culture  growing Proteus mirabilis and Enterococcus faecalis.  Treated with antibiotics.  Dysphagia Patient appears to be aspirating.  He has been on scopolamine patch.  Severe protein calorie malnutrition He also had poor oral intake.  He has been declining.  He initially was on tube feedings which was discontinued after discussions with family.  Hypernatremia:  Resolved with IV fluids  Hypokalemia Was repleted  AKI:  Resolved  A. fib with RVR:  Initially required diltiazem drip, and IV metoprolol pushes.   Chronic combined heart failure Remains compensated, latest EF showed improvement per family report.   Dementia/history of normal pressure hydrocephalus Advanced dementia.  Was on Namenda at home.    History of liver cirrhosis Compensated  Failure to thrive Very weak/debilitated at baseline-now has encephalopathy from Covid and electrolyte abnormalities.   Deep tissue, pressure injury to left heel, POA Pressure Injury 11/12/18 Heel Left Deep Tissue Injury - Purple or maroon localized area of discolored intact skin or blood-filled blister due to damage of underlying soft tissue from pressure and/or shear. maroon with yellow blister-type area in center;  (Active)  11/12/18   Location: Heel  Location Orientation: Left  Staging: Deep Tissue Injury - Purple or maroon localized area of discolored intact skin or blood-filled blister due to damage of underlying soft tissue from pressure and/or shear.  Wound Description (Comments): maroon with yellow blister-type area in center; boggy  Present on Admission: Yes   Goals of care Patient's primary point of contact is his granddaughter who is a physician/hospitalist with the [facility] system, Dr. Josephine Cables.  Goals of care conversations held with her.  Patient thought to be appropriate for hospice.  Social worker was involved.  Will be transferred to hospice facility today.    Okay for discharge today to hospice  facility.   PERTINENT LABS:  The results of significant diagnostics from this hospitalization (  including imaging, microbiology, ancillary and laboratory) are listed below for reference.    Microbiology: Recent Results (from the past 240 hour(s))  Culture, Urine     Status: Abnormal   Collection Time: 11/18/18  2:00 AM   Specimen: Urine, Random  Result Value Ref Range Status   Specimen Description   Final    URINE, RANDOM Performed at [place] [address] [place], [place] [address]    Special Requests   Final    NONE Performed at [facility], [place] [address], [place], [place] [address]    Culture (A)  Final    >=100,000 COLONIES/mL PROTEUS MIRABILIS >=100,000 COLONIES/mL ENTEROCOCCUS FAECALIS    Report Status 11/20/2018 FINAL  Final   Organism ID, Bacteria PROTEUS MIRABILIS (A)  Final   Organism ID, Bacteria ENTEROCOCCUS FAECALIS (A)  Final      Susceptibility   Enterococcus faecalis - MIC*    AMPICILLIN <=2 SENSITIVE Sensitive     LEVOFLOXACIN 1 SENSITIVE Sensitive     NITROFURANTOIN <=16 SENSITIVE Sensitive     VANCOMYCIN 1 SENSITIVE Sensitive     * >=100,000 COLONIES/mL ENTEROCOCCUS FAECALIS   Proteus mirabilis - MIC*    AMPICILLIN <=2 SENSITIVE Sensitive     CEFAZOLIN <=4 SENSITIVE Sensitive     CEFTRIAXONE <=1 SENSITIVE Sensitive     CIPROFLOXACIN <=0.25 SENSITIVE Sensitive     GENTAMICIN <=1 SENSITIVE Sensitive     IMIPENEM 4 SENSITIVE Sensitive     NITROFURANTOIN 128 RESISTANT Resistant     TRIMETH/SULFA <=20 SENSITIVE Sensitive     AMPICILLIN/SULBACTAM <=2 SENSITIVE Sensitive     PIP/TAZO <=4 SENSITIVE Sensitive     * >=100,000 COLONIES/mL PROTEUS MIRABILIS  SARS CORONAVIRUS 2 (TAT 6-24 HRS) Nasopharyngeal Nasopharyngeal Swab     Status: Abnormal   Collection Time: 11/23/18  7:00 PM   Specimen: Nasopharyngeal Swab  Result Value Ref Range Status   SARS Coronavirus 2 POSITIVE (A) NEGATIVE Final    Comment: RESULT  CALLED TO, READ BACK BY AND VERIFIED WITH: Anselmo Rod RN 13:10 11/24/18 ([email or web address]) (NOTE) SARS-CoV-2 target nucleic acids are DETECTED. The SARS-CoV-2 RNA is generally detectable in upper and lower respiratory specimens during the acute phase of infection. Positive results are indicative of active infection with SARS-CoV-2. Clinical  correlation with patient history and other diagnostic information is necessary to determine patient infection status. Positive results do  not rule out bacterial infection or co-infection with other viruses. The expected result is Negative. Fact Sheet for Patients: SugarRoll.be Fact Sheet for Healthcare Providers: https://www.woods-mathews.com/ This test is not yet approved or cleared by the [place] FDA and  has been authorized for detection and/or diagnosis of SARS-CoV-2 by FDA under an Emergency Use Authorization (EUA). This EUA will remain  in effect (meaning this test can be used) fo r the duration of the COVID-19 declaration under Section 564(b)(1) of the Act, 21 U.S.C. section 360bbb-3(b)(1), unless the authorization is terminated or revoked sooner. Performed at [facility], [place] [address], [place], [place] [address]      Labs:  COVID-19 Labs   Lab Results  Component Value Date   SARSCOV2NAA POSITIVE (A) 11/23/2018      Basic Metabolic Panel: Recent Labs  Lab 11/20/18 0439 11/21/18 0457 11/22/18 0840 11/23/18 0146  NA 143 140 140 141  K 4.7 5.0 4.8 4.4  CL 104 104 104 103  CO2 30 26 29 27   GLUCOSE 142* 99 140* 70  BUN 28* 27* 26*  25*  CREATININE 0.91 0.96 0.88 0.74  CALCIUM 8.5* 8.6* 8.5* 8.8*  MG 2.4 2.5* 2.5*  --   PHOS 3.2 3.4 3.2  --    Liver Function Tests: Recent Labs  Lab 11/20/18 0439 11/21/18 0457 11/22/18 0840 11/23/18 0146  AST 29 22 30  56*  ALT 28 23 28  54*  ALKPHOS 91 88 74 83  BILITOT 1.2 1.0 0.9 1.3*  PROT 5.5* 5.7* 5.3* 5.8*  ALBUMIN 2.4* 2.4*  2.1* 2.5*   CBC: Recent Labs  Lab 11/20/18 0439 11/21/18 0457 11/22/18 0840 11/23/18 0146  WBC 13.6* 17.7* 15.4* 16.5*  HGB 16.3 15.9 14.4 15.8  HCT 50.5 49.0 44.6 48.1  MCV 97.9 97.0 96.7 96.2  PLT 211 212 195 223   BNP: BNP (last 3 results) Recent Labs    11/12/18 1252  BNP 121.0*    CBG: Recent Labs  Lab 11/24/18 1312 11/24/18 1637 11/24/18 2131 11/25/18 2340 11/26/18 0644  GLUCAP 97 88 85 81 92     IMAGING STUDIES Dg Abd 1 View  Result Date: 11/20/2018 CLINICAL DATA:  Clogged feeding tube EXAM: ABDOMEN - 1 VIEW COMPARISON:  None. FINDINGS: The enteric tube tip projects over the stomach. Partially visualized ICD lead overlying the heart. There is a paucity of bowel gas but no dilated loops to suggest obstruction. No unexpected calcification. No supine evidence for free air. No acute finding in the visualized skeleton. IMPRESSION: Enteric tube tip projects over the stomach. Recommend advancement if post pyloric positioning is desired. Electronically Signed   By: Audie Pinto M.D.   On: 11/20/2018 10:24   Ct Head Wo Contrast  Result Date: 11/13/2018 CLINICAL DATA:  Encephalopathy EXAM: CT HEAD WITHOUT CONTRAST TECHNIQUE: Contiguous axial images were obtained from the base of the skull through the vertex without intravenous contrast. COMPARISON:  11/18/2017 FINDINGS: Brain: There is no mass, hemorrhage or extra-axial collection. There is generalized atrophy without lobar predilection. Hypodensity of the white matter is most commonly associated with chronic microvascular disease. Vascular: Atherosclerotic calcification of the internal carotid arteries at the skull base. No abnormal hyperdensity of the major intracranial arteries or dural venous sinuses. Skull: The visualized skull base, calvarium and extracranial soft tissues are normal. Sinuses/Orbits: No fluid levels or advanced mucosal thickening of the visualized paranasal sinuses. No mastoid or middle ear effusion.  The orbits are normal. IMPRESSION: Severe atrophy and chronic ischemic white matter disease. No acute abnormality. Electronically Signed   By: Ulyses Jarred M.D.   On: 11/13/2018 02:48   Dg Chest Port 1 View  Result Date: 11/19/2018 CLINICAL DATA:  Patient with hypoxia. EXAM: PORTABLE CHEST 1 VIEW COMPARISON:  Chest radiograph 11/17/2018 FINDINGS: Monitoring leads overlie the patient. Enteric tube courses inferior to the diaphragm. Multi lead AICD device overlies the left hemithorax, leads are stable in position. Stable cardiac and mediastinal contours. Low lung volumes. Similar-appearing left mid lower lung consolidation. Right mid lung consolidation. Thoracic spine degenerative changes. IMPRESSION: Enteric tube courses inferior to the diaphragm. Left mid lower lung and right mid lung consolidation, similar to prior. Electronically Signed   By: Lovey Newcomer M.D.   On: 11/19/2018 18:57   Dg Chest Port 1 View  Result Date: 11/17/2018 CLINICAL DATA:  Hypoxia. EXAM: PORTABLE CHEST 1 VIEW COMPARISON:  11/12/2018 FINDINGS: Left-sided pacemaker unchanged. Lungs are somewhat hypoinflated with minimal persistent patchy density left base. Improved right midlung opacification. No effusion. Cardiomediastinal silhouette and remainder of the exam is unchanged. IMPRESSION: Hypoinflation with persistent mild patchy left basilar  opacification and improved right midlung opacification. Electronically Signed   By: Marin Olp M.D.   On: 11/17/2018 15:51   Dg Chest Port 1 View  Result Date: 11/12/2018 CLINICAL DATA:  Altered mental status. COVID-19 positive last week. Tachycardia. EXAM: PORTABLE CHEST 1 VIEW COMPARISON:  11/18/2017 FINDINGS: Left-sided pacemaker unchanged. Lungs are somewhat hypoinflated with mild patchy airspace opacification over the mid to lower lungs suggesting multifocal infection. No effusion. Borderline stable cardiomegaly. Remainder of the exam is unchanged. IMPRESSION: Hypoinflation with mild  patchy airspace process over the mid to lower lungs likely multifocal infection. Electronically Signed   By: Marin Olp M.D.   On: 11/12/2018 13:07    DISCHARGE EXAMINATION: Vitals:   11/25/18 0842 11/25/18 1956 11/26/18 0453 11/26/18 0952  BP: (!) 130/59 (!) 152/77 131/67 104/63  Pulse: 61   60  Resp: 19 (!) 22    Temp: 97.7 F (36.5 C) 98.9 F (37.2 C)  98 F (36.7 C)  TempSrc: Axillary Axillary  Axillary  SpO2: 93%   98%  Weight:      Height:       General appearance: Lethargic.  Not very responsive. Resp: Coarse breath sounds bilaterally. Cardio: S1-S2 is normal regular.  No S3-S4.  No rubs murmurs or bruit GI: Abdomen is soft.  Nontender nondistended.  Bowel sounds are present normal.  No masses organomegaly   DISPOSITION: Residential hospice  Discharge Instructions    Discharge instructions   Complete by: As directed    COVID 19 INSTRUCTIONS  - You are felt to be stable enough to no longer require inpatient monitoring, testing, and treatment, though you will need to follow the recommendations below: - Based on the CDC's non-test criteria for ending self-isolation: You may not return to work/leave the home until at least 21 days since symptom onset AND 3 days without a fever (without taking tylenol, ibuprofen, etc.) AND have improvement in respiratory symptoms. - Do not take NSAID medications (including, but not limited to, ibuprofen, advil, motrin, naproxen, aleve, goody's powder, etc.) - Follow up with your doctor in the next week via telehealth or seek medical attention right away if your symptoms get WORSE.  - Consider donating plasma after you have recovered (either 14 days after a negative test or 28 days after symptoms have completely resolved) because your antibodies to this virus may be helpful to give to others with life-threatening infections. Please go to the website www.[email or web address] if you would like to consider volunteering for plasma donation.    Directions  for you at home:  Wear a facemask You should wear a facemask that covers your nose and mouth when you are in the same room with other people and when you visit a healthcare provider. People who live with or visit you should also wear a facemask while they are in the same room with you.  Separate yourself from other people in your home As much as possible, you should stay in a different room from other people in your home. Also, you should use a separate bathroom, if available.  Avoid sharing household items You should not share dishes, drinking glasses, cups, eating utensils, towels, bedding, or other items with other people in your home. After using these items, you should wash them thoroughly with soap and water.  Cover your coughs and sneezes Cover your mouth and nose with a tissue when you cough or sneeze, or you can cough or sneeze into your sleeve. Throw used tissues in a lined trash can,  and immediately wash your hands with soap and water for at least 20 seconds or use an alcohol-based hand rub.  Wash your [facility] your hands often and thoroughly with soap and water for at least 20 seconds. You can use an alcohol-based hand sanitizer if soap and water are not available and if your hands are not visibly dirty. Avoid touching your eyes, nose, and mouth with unwashed hands.  Directions for those who live with, or provide care at home for you:  Limit the number of people who have contact with the patient If possible, have only one caregiver for the patient. Other household members should stay in another home or place of residence. If this is not possible, they should stay in another room, or be separated from the patient as much as possible. Use a separate bathroom, if available. Restrict visitors who do not have an essential need to be in the home.  Ensure good ventilation Make sure that shared spaces in the home have good air flow, such as from an air conditioner or an opened  window, weather permitting.  Wash your hands often Wash your hands often and thoroughly with soap and water for at least 20 seconds. You can use an alcohol based hand sanitizer if soap and water are not available and if your hands are not visibly dirty. Avoid touching your eyes, nose, and mouth with unwashed hands. Use disposable paper towels to dry your hands. If not available, use dedicated cloth towels and replace them when they become wet.  Wear a facemask and gloves Wear a disposable facemask at all times in the room and gloves when you touch or have contact with the patients blood, body fluids, and/or secretions or excretions, such as sweat, saliva, sputum, nasal mucus, vomit, urine, or feces.  Ensure the mask fits over your nose and mouth tightly, and do not touch it during use. Throw out disposable facemasks and gloves after using them. Do not reuse. Wash your hands immediately after removing your facemask and gloves. If your personal clothing becomes contaminated, carefully remove clothing and launder. Wash your hands after handling contaminated clothing. Place all used disposable facemasks, gloves, and other waste in a lined container before disposing them with other household waste. Remove gloves and wash your hands immediately after handling these items.  Do not share dishes, glasses, or other household items with the patient Avoid sharing household items. You should not share dishes, drinking glasses, cups, eating utensils, towels, bedding, or other items with a patient who is confirmed to have, or being evaluated for, COVID-19 infection. After the person uses these items, you should wash them thoroughly with soap and water.  Wash laundry thoroughly Immediately remove and wash clothes or bedding that have blood, body fluids, and/or secretions or excretions, such as sweat, saliva, sputum, nasal mucus, vomit, urine, or feces, on them. Wear gloves when handling laundry from the  patient. Read and follow directions on labels of laundry or clothing items and detergent. In general, wash and dry with the warmest temperatures recommended on the label.  Clean all areas the individual has used often Clean all touchable surfaces, such as counters, tabletops, doorknobs, bathroom fixtures, toilets, phones, keyboards, tablets, and bedside tables, every day. Also, clean any surfaces that may have blood, body fluids, and/or secretions or excretions on them. Wear gloves when cleaning surfaces the patient has come in contact with. Use a diluted bleach solution (e.g., dilute bleach with 1 part bleach and 10 parts water) or  a household disinfectant with a label that says EPA-registered for coronaviruses. To make a bleach solution at home, add 1 tablespoon of bleach to 1 quart (4 cups) of water. For a larger supply, add  cup of bleach to 1 gallon (16 cups) of water. Read labels of cleaning products and follow recommendations provided on product labels. Labels contain instructions for safe and effective use of the cleaning product including precautions you should take when applying the product, such as wearing gloves or eye protection and making sure you have good ventilation during use of the product. Remove gloves and wash hands immediately after cleaning.  Monitor yourself for signs and symptoms of illness Caregivers and household members are considered close contacts, should monitor their health, and will be asked to limit movement outside of the home to the extent possible. Follow the monitoring steps for close contacts listed on the symptom monitoring form.   If you have additional questions, contact your local health department or call the epidemiologist on call at [phone] (available 24/7). This guidance is subject to change. For the most up-to-date guidance from [facility], please refer to their  website: YouBlogs.pl   You were cared for by a hospitalist during your hospital stay. If you have any questions about your discharge medications or the care you received while you were in the hospital after you are discharged, you can call the unit and asked to speak with the hospitalist on call if the hospitalist that took care of you is not available. Once you are discharged, your primary care physician will handle any further medical issues. Please note that NO REFILLS for any discharge medications will be authorized once you are discharged, as it is imperative that you return to your primary care physician (or establish a relationship with a primary care physician if you do not have one) for your aftercare needs so that they can reassess your need for medications and monitor your lab values. If you do not have a primary care physician, you can call [phone] for a physician referral.        Allergies as of 11/26/2018      Reactions   Cephalexin Other (See Comments)   Confusion, hives, hallucinatons   Clindamycin/lincomycin Other (See Comments)   Confusion, hives, hallucinations   Morphine And Related Other (See Comments)      Medication List    STOP taking these medications   aspirin 81 MG chewable tablet   bisacodyl 10 MG suppository Commonly known as: DULCOLAX   Cholecalciferol 1.25 MG (50000 UT) capsule   dutasteride 0.5 MG capsule Commonly known as: AVODART   ketoconazole 2 % cream Commonly known as: NIZORAL   LUTEIN 20 PO   memantine 10 MG tablet Commonly known as: NAMENDA   Milk of Magnesia 400 MG/5ML suspension Generic drug: magnesium hydroxide   senna-docusate 8.6-50 MG tablet Commonly known as: Senokot-S   torsemide 20 MG tablet Commonly known as: DEMADEX     TAKE these medications   acetaminophen 325 MG tablet Commonly known as: TYLENOL Take 650 mg by mouth every 4 (four) hours as needed  for mild pain or moderate pain.   Ensure Take 237 mLs by mouth every evening.   haloperidol lactate 5 MG/ML injection Commonly known as: HALDOL Inject 0.4 mLs (2 mg total) into the vein every 6 (six) hours as needed.   nystatin powder Generic drug: nystatin Apply 1 g topically 2 (two) times daily. Apply to groin and testicles   scopolamine 1 MG/3DAYS  Commonly known as: TRANSDERM-SCOP Place 1 patch (1.5 mg total) onto the skin every 3 (three) days. Start taking on: November 28, 2018          TOTAL DISCHARGE TIME: 32 minutes  [place] [employer] operational officer on [email or web address]  11/26/2018, 11:26 AM

## 2018-11-26 NOTE — TOC Transition Note (Signed)
Transition of Care [facility]) - CM/SW Discharge Note   Patient Details  Name: Esko Imparato. MRN: [id number] Date of Birth: July 15, 1925  [facility] CM/SW Contact:  Weston Anna, LCSW Phone Number: 11/26/2018, 11:27 AM   Clinical Narrative:     Patient set to discharge to [facility] today- please call report to [phone]. Patient will need transportation via [facility]. Daughter, [name], aware of discharge.   Final next level of care: [place] Barriers to Discharge: No Barriers Identified   Patient Goals and CMS Choice        Discharge Placement                Patient to be transferred to facility by: ptar Name of family member notified: lynn Patient and family notified of of transfer: 11/26/18  Discharge Plan and Services In-house Referral: Clinical Social Work              DME Arranged: N/A         HH Arranged: NA          Social Determinants of Health (SDOH) Interventions     Readmission Risk Interventions No flowsheet data found.

## 2018-11-26 NOTE — Consult Note (Signed)
[facility] Patient has been approved for admission to our [place] at [place].  She will be transferred this afternoon at 12:30PM which social work is Software engineer through [employer].  Claudette Stapler RN [phone]

## 2018-11-26 NOTE — Discharge Planning (Addendum)
IV removed.  RN assessment indicated ready/able to DC/transfer to [facility].  Packet  Spoke with daughter to inform of Hospice acceptance of patient and EMS to pick up about 1230. No further questions at this time. Report called s/w Dorna Leitz, RN. Michelle asked for [place] to leave Condom cath in place if possible. Packet being put together with signed DNR and any needed scripts - then will be given to [facility] for transport - taking to room 103. Set to arrive at 1230 - Waiting on arrival.

## 2018-11-26 NOTE — Discharge Instructions (Signed)
Hospice °Hospice is a service that is designed to provide people who are terminally ill and their families with medical, spiritual, and psychological support. Its aim is to improve your quality of life by keeping you as comfortable as possible in the final stages of life. °Who will be my providers when I begin hospice care? °Hospice teams often include: °· A nurse. °· A doctor. The hospice doctor will be available for your care, but you can include your regular doctor or nurse practitioner. °· A social worker. °· A counselor. °· A religious leader (such as a chaplain). °· A dietitian. °· Therapists. °· Trained volunteers who can help with care. °What services does hospice provide? °Hospice services can vary depending on the center or organization. Generally, they include: °· Ways to keep you comfortable, such as: °? Providing care in your home or in a home-like setting. °? Working with your family and friends to help meet your needs. °? Allowing you to enjoy the support of loved ones by receiving much of your basic care from family and friends. °· Pain relief and symptom management. The staff will supply all necessary medicines and equipment so that you can stay comfortable and alert enough to enjoy the company of your friends and family. °· Visits or care from a nurse and doctor. This may include 24-hour on-call services. °· Companionship when you are alone. °· Allowing you and your family to rest. Hospice staff may do light housekeeping, prepare meals, and run errands. °· Counseling. They will make sure your emotional, spiritual, and social needs are being met, as well as those needs of your family members. °· Spiritual care. This will be individualized to meet your needs and your family's needs. It may involve: °? Helping you and your family understand the dying process. °? Helping you say goodbye to your family and friends. °? Performing a specific religious ceremony or ritual. °· Massage. °· Nutrition  therapy. °· Physical and occupational therapy. °· Short-term inpatient care, if something cannot be managed in the home. °· Art or music therapy. °· Bereavement support for grieving family members. °When should hospice care begin? °Most people who use hospice are believed to have less than 6 months to live. °· Your family and health care providers can help you decide when hospice services should begin. °· If you live longer than 6 months but your condition does not improve, your doctor may be able to approve you for continued hospice care. °· If your condition improves, you may discontinue the program. °What should I consider before selecting a program? °Most hospice programs are run by nonprofit, independent organizations. Some are affiliated with hospitals, nursing homes, or home health care agencies. Hospice programs can take place in your home or at a hospice center, hospital, or skilled nursing facility. When choosing a hospice program, ask the following questions: °· What services are available to me? °· What services will be offered to my loved ones? °· How involved will my loved ones be? °· How involved will my health care provider be? °· Who makes up the hospice care team? How are they trained or screened? °· How will my pain and symptoms be managed? °· If my circumstances change, can the services be provided in a different setting, such as my home or in the hospital? °· Is the program reviewed and licensed by the state or certified in some other way? °· What does it cost? Is it covered by insurance? °· If I choose a hospice   center or nursing home, where is the hospice center located? Is it convenient for family and friends? °· If I choose a hospice center or nursing home, can my family and friends visit any time? °· Will you provide emotional and spiritual support? °· Who can my family call with questions? °Where can I learn more about hospice? °You can learn about existing hospice programs in your area  from your health care providers. You can also read more about hospice online. The websites of the following organizations have helpful information: °· National Hospice and Palliative Care Organization (NHPCO): www.nhpco.org °· National Association for [employer] & Hospice (NAHC): www.nahc.org °· [employer] (HFA): www.hospicefoundation.org °· American Cancer Society (ACS): www.cancer.org °· [facility]: www.hospicenet.org °· [facility] [employer] (VNAA): www.vnaa.org °You may also find more information by contacting the following agencies: °· A local agency on aging. °· Your local [employer] chapter. °· Your state's department of health or social services. °Summary °· Hospice is a service that is designed to provide people who are terminally ill and their families with medical, spiritual, and psychological support. °· Hospice aims to improve your quality of life by keeping you as comfortable as possible in the final stages of life. °· Hospice teams often include a doctor, nurse, social worker, counselor, religious leader,dietitian, therapists, and volunteers. °· Hospice care generally includes medicine for symptom management, visits from doctors and nurses, physical and occupational therapy, nutrition counseling, spiritual and emotional counseling, caregiver support, and bereavement support for grieving family members. °· Hospice programs can take place in your home or at a hospice center, hospital, or skilled nursing facility. °This information is not intended to replace advice given to you by your health care provider. Make sure you discuss any questions you have with your health care provider. °Document Released: 04/18/2003 Document Revised: 12/12/2016 Document Reviewed: 01/22/2016 °Elsevier Patient Education © 2020 [employer] ° °

## 2018-12-14 DEATH — deceased

## 2021-07-13 IMAGING — CT CT HEAD W/O CM
1 of 3 series · 15 of 30 positions shown, 19 images · non-contrast
Comparison: 11/18/2017

CLINICAL DATA: Encephalopathy

EXAM:
CT HEAD WITHOUT CONTRAST
TECHNIQUE: Contiguous axial images were obtained from the base of the skull
through the vertex without intravenous contrast.

[Series 3: head w/o thins · axial · non-contrast · 0.46mm/px · z∈[+488,+642]mm · 15 of 144 slices shown, 19 images]
[im 8/144  brain]
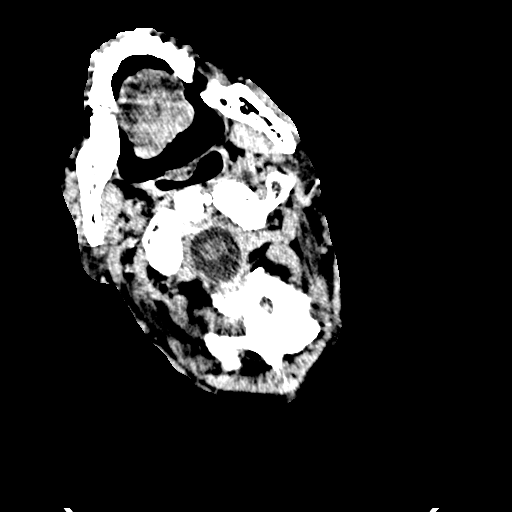
[im 8/144  bone]
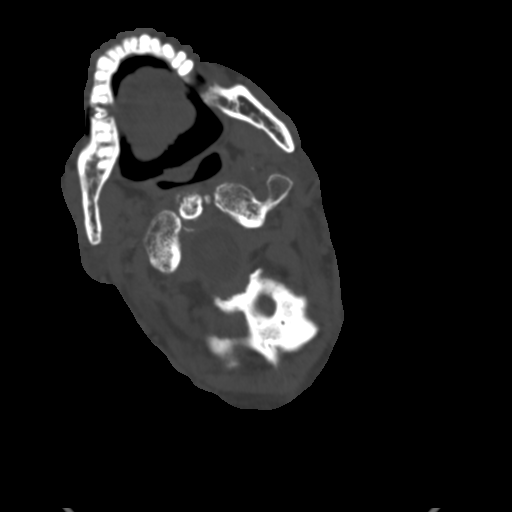
[im 16/144  brain]
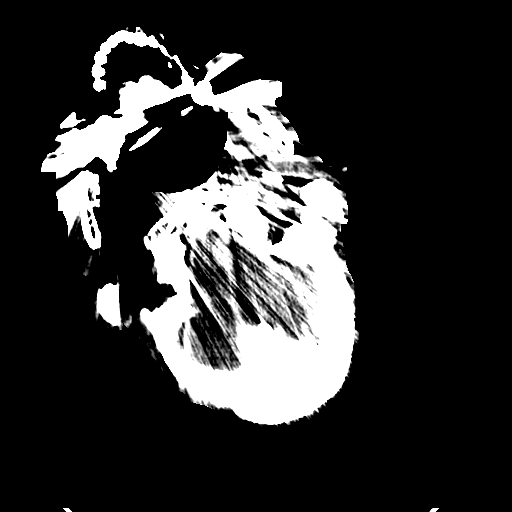
[im 31/144  brain]
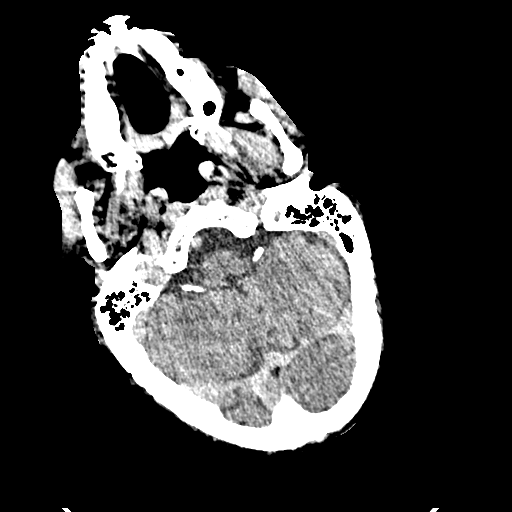
[im 38/144  brain]
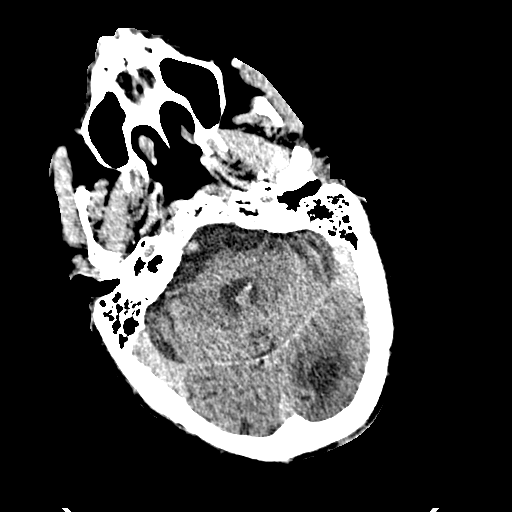
[im 46/144  brain]
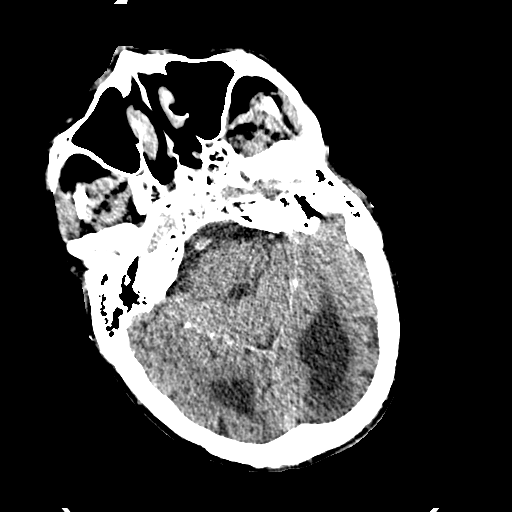
[im 46/144  bone]
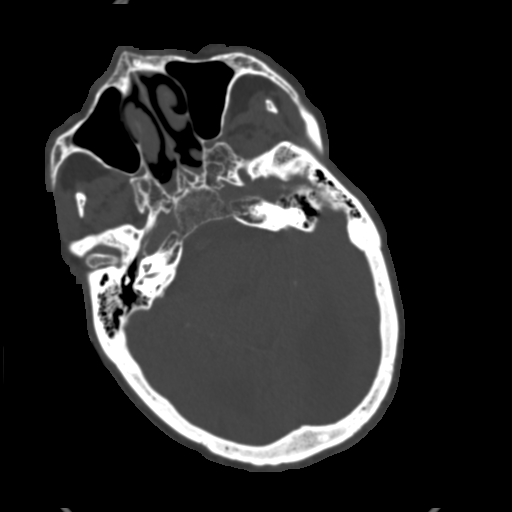
[im 53/144  brain]
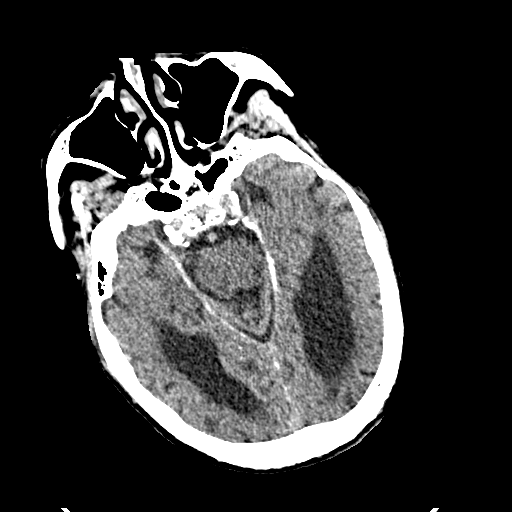
[im 61/144  brain]
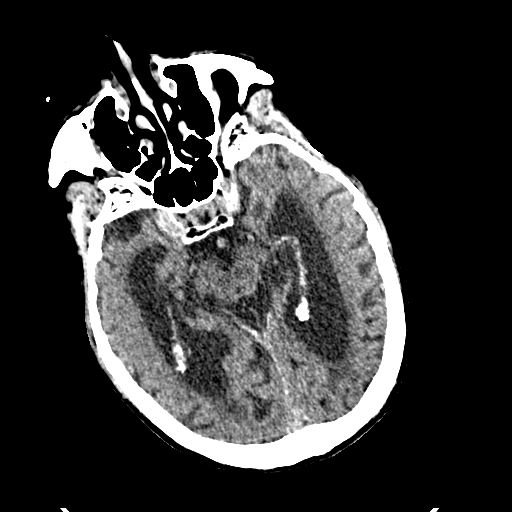
[im 76/144  brain]
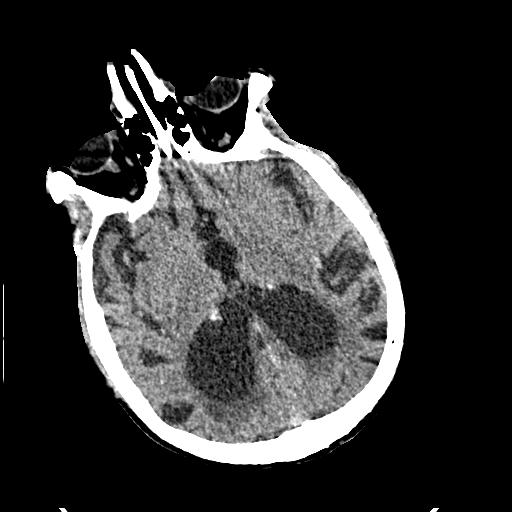
[im 83/144  brain]
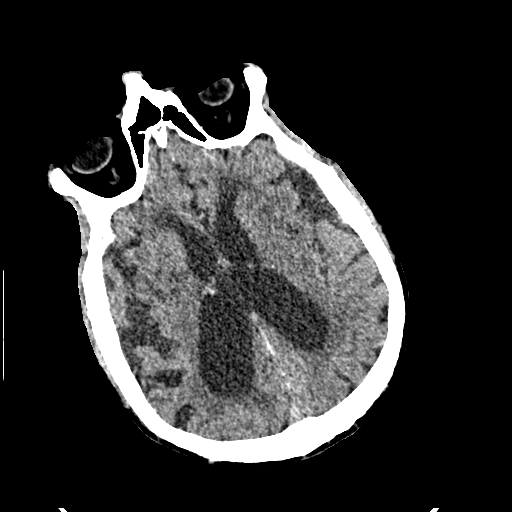
[im 83/144  bone]
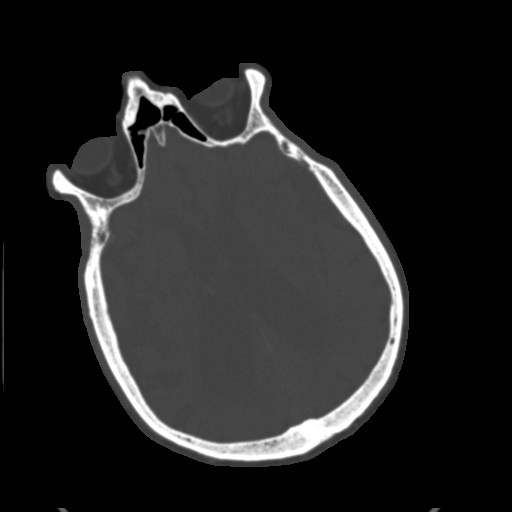
[im 91/144  brain]
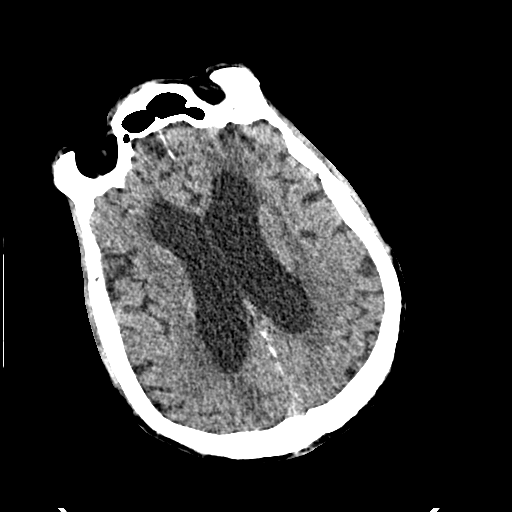
[im 98/144  brain]
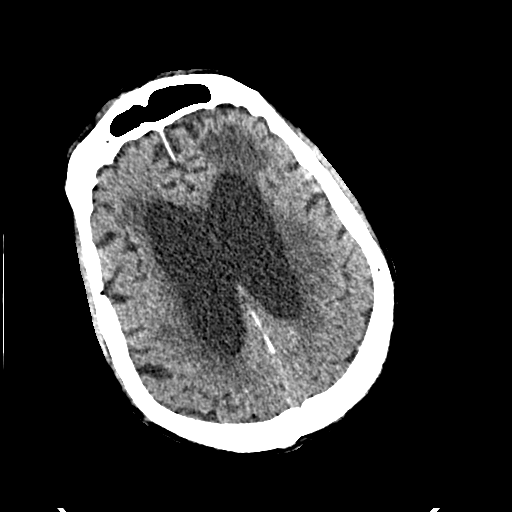
[im 106/144  brain]
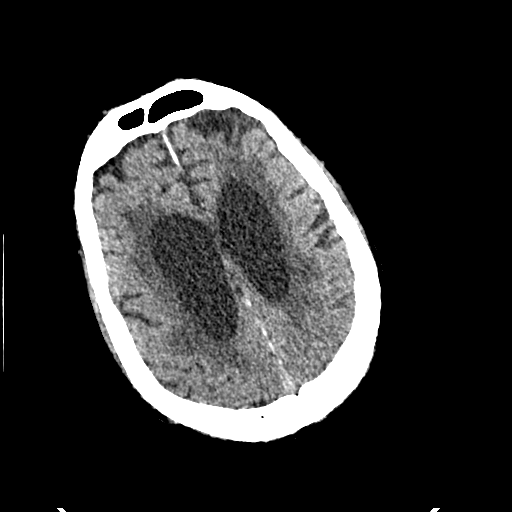
[im 121/144  brain]
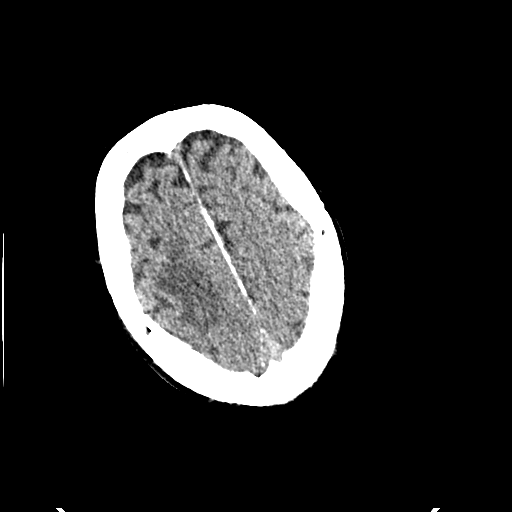
[im 121/144  bone]
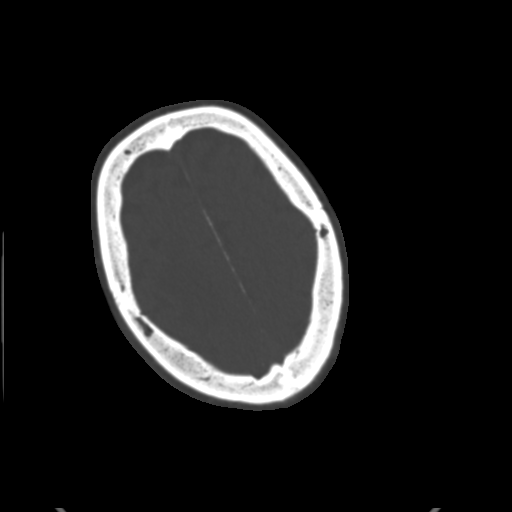
[im 128/144  brain]
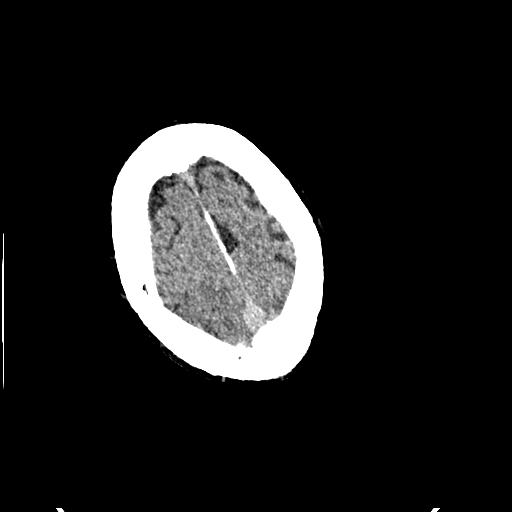
[im 136/144  brain]
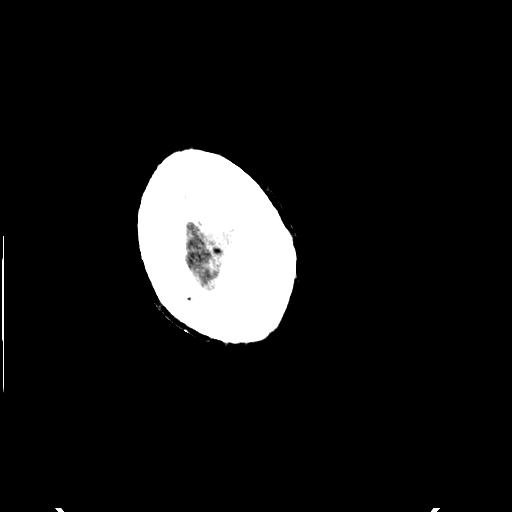

[15 of 30 positions shown; findings below may reference images not displayed]

FINDINGS: Brain: There is no mass, hemorrhage or extra-axial collection. There
is generalized atrophy without lobar predilection. Hypodensity of
the white matter is most commonly associated with chronic
microvascular disease.

Vascular: Atherosclerotic calcification of the internal carotid
arteries at the skull base. No abnormal hyperdensity of the major
intracranial arteries or dural venous sinuses.

Skull: The visualized skull base, calvarium and extracranial soft
tissues are normal.

Sinuses/Orbits: No fluid levels or advanced mucosal thickening of
the visualized paranasal sinuses. No mastoid or middle ear effusion.
The orbits are normal.
IMPRESSION: Severe atrophy and chronic ischemic white matter disease. No acute
abnormality.

## 2021-07-20 IMAGING — DX DG ABDOMEN 1V
2 series · 2 of 2 positions shown · non-contrast
Comparison: None.

CLINICAL DATA: Clogged feeding tube

EXAM:
ABDOMEN - 1 VIEW

[abdomen (1 of 2)]
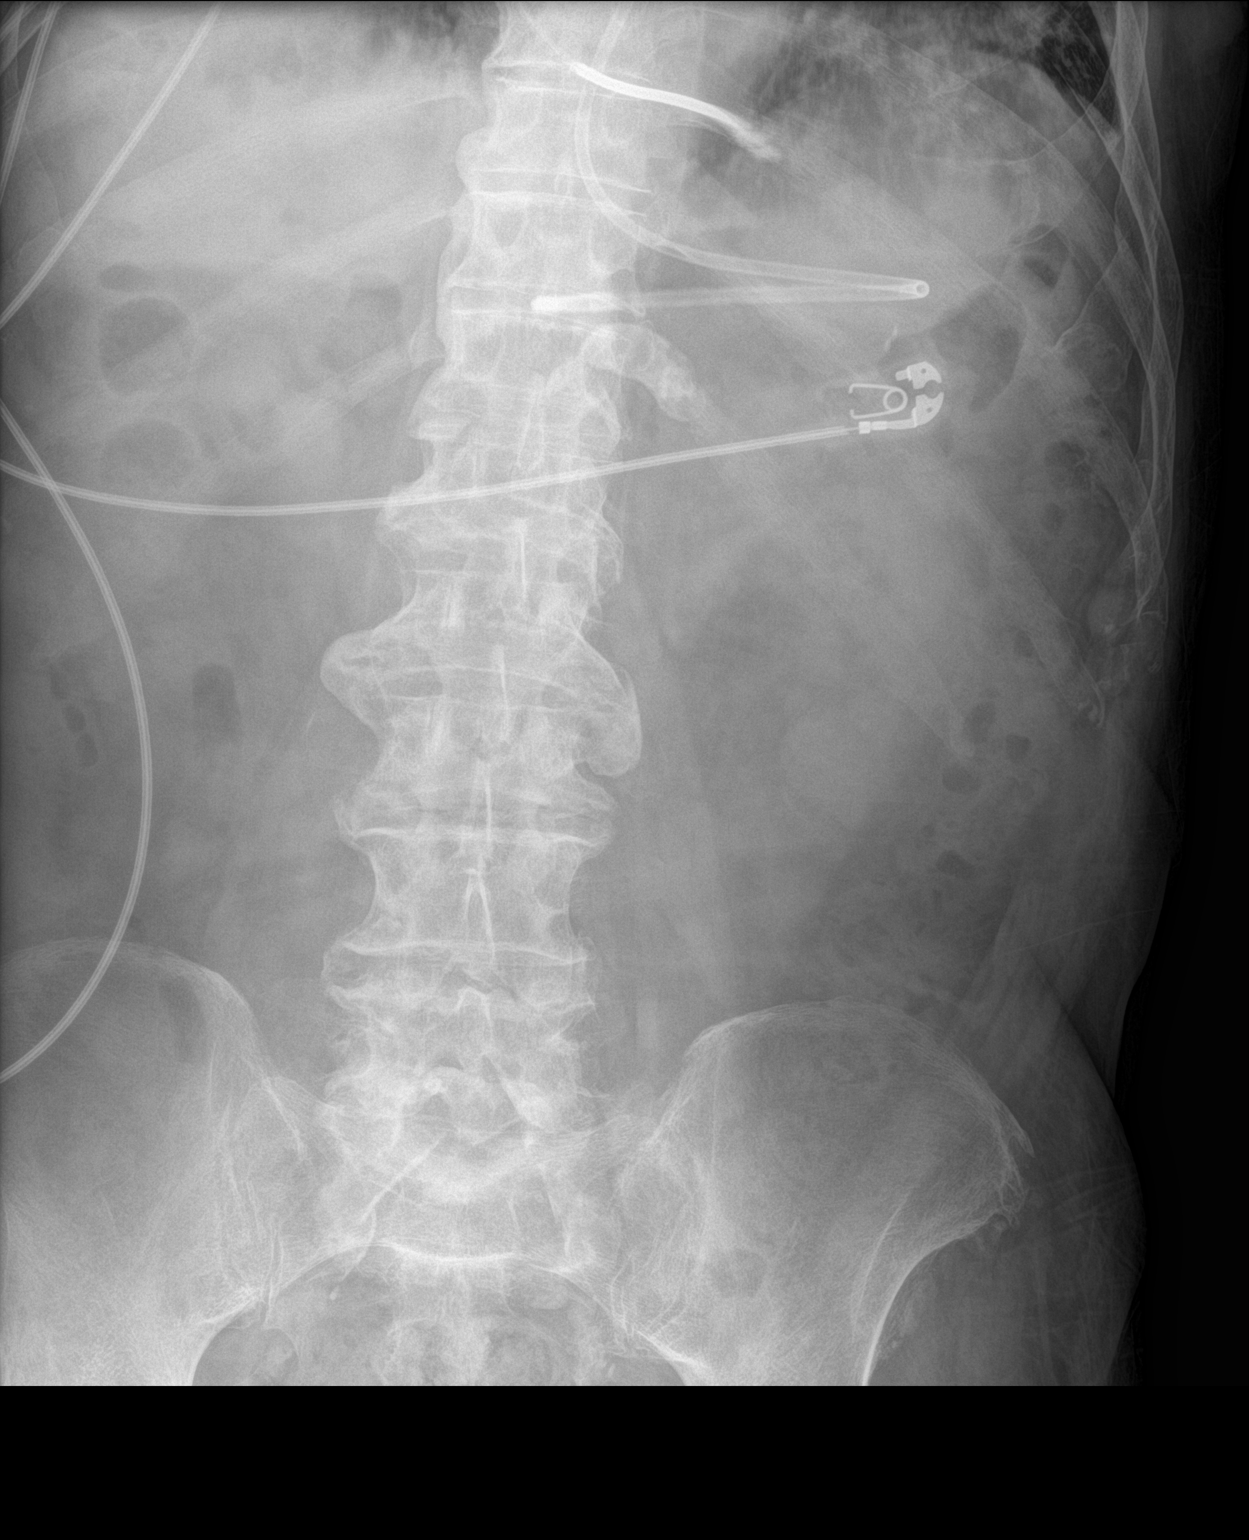

[abdomen (2 of 2)]
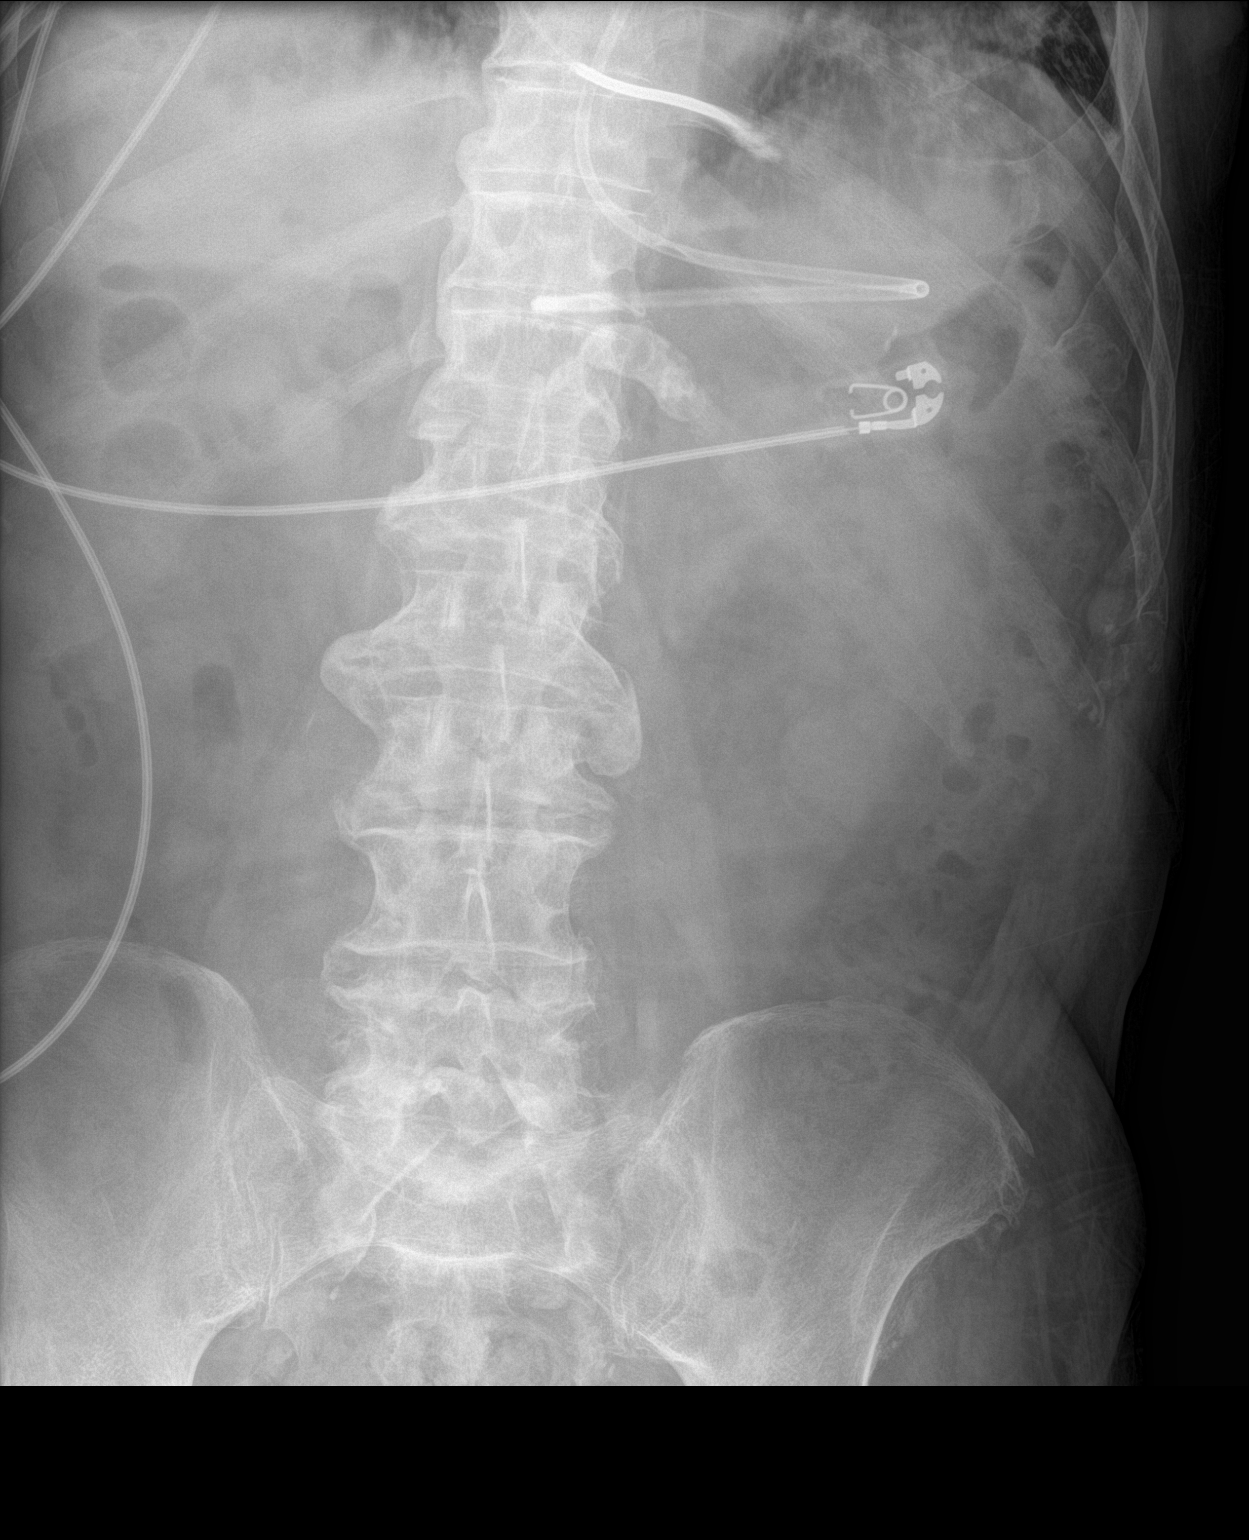

[2 of 2 positions shown; findings below may reference images not displayed]

FINDINGS: The enteric tube tip projects over the stomach. Partially visualized
ICD lead overlying the heart. There is a paucity of bowel gas but no
dilated loops to suggest obstruction. No unexpected calcification.
No supine evidence for free air. No acute finding in the visualized
skeleton.
IMPRESSION: Enteric tube tip projects over the stomach. Recommend advancement if
post pyloric positioning is desired.
